# Patient Record
Sex: Female | Born: 1940
Health system: Southern US, Community
[De-identification: ages and names within clinical notes are randomized; demographics above are authoritative.]

## PROBLEM LIST (undated history)

## (undated) DIAGNOSIS — I420 Dilated cardiomyopathy: Secondary | ICD-10-CM

## (undated) DIAGNOSIS — Z800 Family history of malignant neoplasm of digestive organs: Secondary | ICD-10-CM

## (undated) DIAGNOSIS — K449 Diaphragmatic hernia without obstruction or gangrene: Secondary | ICD-10-CM

## (undated) DIAGNOSIS — Z8049 Family history of malignant neoplasm of other genital organs: Secondary | ICD-10-CM

## (undated) DIAGNOSIS — Z9049 Acquired absence of other specified parts of digestive tract: Secondary | ICD-10-CM

## (undated) DIAGNOSIS — R112 Nausea with vomiting, unspecified: Secondary | ICD-10-CM

## (undated) DIAGNOSIS — I10 Essential (primary) hypertension: Secondary | ICD-10-CM

## (undated) DIAGNOSIS — E785 Hyperlipidemia, unspecified: Secondary | ICD-10-CM

## (undated) DIAGNOSIS — T8859XA Other complications of anesthesia, initial encounter: Secondary | ICD-10-CM

## (undated) DIAGNOSIS — G4730 Sleep apnea, unspecified: Secondary | ICD-10-CM

## (undated) DIAGNOSIS — K5790 Diverticulosis of intestine, part unspecified, without perforation or abscess without bleeding: Secondary | ICD-10-CM

## (undated) DIAGNOSIS — I4819 Other persistent atrial fibrillation: Secondary | ICD-10-CM

## (undated) DIAGNOSIS — R7303 Prediabetes: Secondary | ICD-10-CM

## (undated) DIAGNOSIS — R011 Cardiac murmur, unspecified: Secondary | ICD-10-CM

## (undated) DIAGNOSIS — F419 Anxiety disorder, unspecified: Secondary | ICD-10-CM

## (undated) DIAGNOSIS — M1990 Unspecified osteoarthritis, unspecified site: Secondary | ICD-10-CM

## (undated) DIAGNOSIS — I2510 Atherosclerotic heart disease of native coronary artery without angina pectoris: Secondary | ICD-10-CM

## (undated) DIAGNOSIS — Z9889 Other specified postprocedural states: Secondary | ICD-10-CM

## (undated) DIAGNOSIS — R0600 Dyspnea, unspecified: Secondary | ICD-10-CM

## (undated) DIAGNOSIS — E049 Nontoxic goiter, unspecified: Secondary | ICD-10-CM

## (undated) HISTORY — DX: Diaphragmatic hernia without obstruction or gangrene: K44.9

## (undated) HISTORY — DX: Dilated cardiomyopathy: I42.0

## (undated) HISTORY — DX: Family history of malignant neoplasm of other genital organs: Z80.49

## (undated) HISTORY — DX: Essential (primary) hypertension: I10

## (undated) HISTORY — DX: Family history of malignant neoplasm of digestive organs: Z80.0

## (undated) HISTORY — DX: Other persistent atrial fibrillation: I48.19

## (undated) HISTORY — DX: Hyperlipidemia, unspecified: E78.5

## (undated) HISTORY — DX: Diverticulosis of intestine, part unspecified, without perforation or abscess without bleeding: K57.90

## (undated) HISTORY — PX: OTHER SURGICAL HISTORY: SHX169

## (undated) HISTORY — PX: CHOLECYSTECTOMY: SHX55

## (undated) HISTORY — DX: Nontoxic goiter, unspecified: E04.9

## (undated) HISTORY — DX: Atherosclerotic heart disease of native coronary artery without angina pectoris: I25.10

---

## 1998-07-10 ENCOUNTER — Ambulatory Visit (HOSPITAL_COMMUNITY): Admission: RE | Admit: 1998-07-10 | Discharge: 1998-07-10 | Payer: Self-pay | Admitting: *Deleted

## 1998-11-09 ENCOUNTER — Ambulatory Visit (HOSPITAL_COMMUNITY): Admission: RE | Admit: 1998-11-09 | Discharge: 1998-11-09 | Payer: Self-pay | Admitting: Gastroenterology

## 2000-01-22 ENCOUNTER — Encounter: Payer: Self-pay | Admitting: Family Medicine

## 2000-01-22 ENCOUNTER — Ambulatory Visit (HOSPITAL_COMMUNITY): Admission: RE | Admit: 2000-01-22 | Discharge: 2000-01-22 | Payer: Self-pay | Admitting: Family Medicine

## 2000-05-08 ENCOUNTER — Encounter: Payer: Self-pay | Admitting: Family Medicine

## 2000-05-08 ENCOUNTER — Ambulatory Visit (HOSPITAL_COMMUNITY): Admission: RE | Admit: 2000-05-08 | Discharge: 2000-05-08 | Payer: Self-pay | Admitting: Family Medicine

## 2000-12-22 ENCOUNTER — Encounter: Admission: RE | Admit: 2000-12-22 | Discharge: 2000-12-22 | Payer: Self-pay | Admitting: Obstetrics and Gynecology

## 2000-12-22 ENCOUNTER — Encounter: Payer: Self-pay | Admitting: Obstetrics and Gynecology

## 2000-12-30 ENCOUNTER — Encounter: Admission: RE | Admit: 2000-12-30 | Discharge: 2000-12-30 | Payer: Self-pay | Admitting: Family Medicine

## 2000-12-30 ENCOUNTER — Encounter: Payer: Self-pay | Admitting: Family Medicine

## 2001-12-10 ENCOUNTER — Other Ambulatory Visit: Admission: RE | Admit: 2001-12-10 | Discharge: 2001-12-10 | Payer: Self-pay | Admitting: Obstetrics and Gynecology

## 2002-02-24 ENCOUNTER — Encounter: Payer: Self-pay | Admitting: Family Medicine

## 2002-02-24 ENCOUNTER — Ambulatory Visit (HOSPITAL_COMMUNITY): Admission: RE | Admit: 2002-02-24 | Discharge: 2002-02-24 | Payer: Self-pay | Admitting: Family Medicine

## 2003-01-16 ENCOUNTER — Other Ambulatory Visit: Admission: RE | Admit: 2003-01-16 | Discharge: 2003-01-16 | Payer: Self-pay | Admitting: Obstetrics and Gynecology

## 2003-10-09 ENCOUNTER — Ambulatory Visit (HOSPITAL_COMMUNITY): Admission: RE | Admit: 2003-10-09 | Discharge: 2003-10-09 | Payer: Self-pay | Admitting: Family Medicine

## 2003-12-25 ENCOUNTER — Ambulatory Visit (HOSPITAL_COMMUNITY): Admission: RE | Admit: 2003-12-25 | Discharge: 2003-12-25 | Payer: Self-pay | Admitting: Gastroenterology

## 2004-04-15 ENCOUNTER — Other Ambulatory Visit: Admission: RE | Admit: 2004-04-15 | Discharge: 2004-04-15 | Payer: Self-pay | Admitting: Obstetrics and Gynecology

## 2004-05-02 ENCOUNTER — Encounter: Admission: RE | Admit: 2004-05-02 | Discharge: 2004-05-02 | Payer: Self-pay | Admitting: Obstetrics and Gynecology

## 2007-05-13 ENCOUNTER — Ambulatory Visit (HOSPITAL_COMMUNITY): Admission: RE | Admit: 2007-05-13 | Discharge: 2007-05-13 | Payer: Self-pay | Admitting: Specialist

## 2008-01-03 ENCOUNTER — Other Ambulatory Visit: Admission: RE | Admit: 2008-01-03 | Discharge: 2008-01-03 | Payer: Self-pay | Admitting: Family Medicine

## 2008-05-11 ENCOUNTER — Encounter: Admission: RE | Admit: 2008-05-11 | Discharge: 2008-05-11 | Payer: Self-pay | Admitting: Family Medicine

## 2009-01-15 ENCOUNTER — Ambulatory Visit: Payer: Self-pay | Admitting: Gastroenterology

## 2009-02-07 ENCOUNTER — Telehealth: Payer: Self-pay | Admitting: Gastroenterology

## 2009-02-08 ENCOUNTER — Ambulatory Visit: Payer: Self-pay | Admitting: Gastroenterology

## 2010-02-16 ENCOUNTER — Observation Stay (HOSPITAL_COMMUNITY): Admission: EM | Admit: 2010-02-16 | Discharge: 2010-02-16 | Payer: Self-pay | Admitting: Internal Medicine

## 2010-02-16 ENCOUNTER — Encounter: Payer: Self-pay | Admitting: Emergency Medicine

## 2010-02-16 ENCOUNTER — Ambulatory Visit: Payer: Self-pay | Admitting: Diagnostic Radiology

## 2010-12-22 ENCOUNTER — Encounter: Payer: Self-pay | Admitting: Specialist

## 2010-12-31 NOTE — Miscellaneous (Signed)
Summary: [facility] Previsit/prep  Clinical Lists Changes  Medications: Added new medication of DULCOLAX 5 MG  TBEC (BISACODYL) Day before procedure take 2 at 3pm and 2 at 8pm. - Signed Added new medication of METOCLOPRAMIDE HCL 10 MG  TABS (METOCLOPRAMIDE HCL) As per prep instructions. - Signed Added new medication of MIRALAX   POWD (POLYETHYLENE GLYCOL 3350) As per prep  instructions. - Signed Rx of DULCOLAX 5 MG  TBEC (BISACODYL) Day before procedure take 2 at 3pm and 2 at 8pm.;  #4 x 0;  Signed;  Entered by: Wyona Almas RN;  Authorized by: Louis Meckel MD;  Method used: Electronically to [facility] [phone] [place], [place], [place]  [address], Ph: [phone], Fax: [phone] Rx of METOCLOPRAMIDE HCL 10 MG  TABS (METOCLOPRAMIDE HCL) As per prep instructions.;  #2 x 0;  Signed;  Entered by: Wyona Almas RN;  Authorized by: Louis Meckel MD;  Method used: Electronically to [facility] [phone] [place], [place], [place]  [address], Ph: [phone], Fax: [phone] Rx of MIRALAX   POWD (POLYETHYLENE GLYCOL 3350) As per prep  instructions.;  #255gm x 0;  Signed;  Entered by: Wyona Almas RN;  Authorized by: Louis Meckel MD;  Method used: Electronically to [facility] [phone] [place], [place], [place]  [address], Ph: [phone], Fax: [phone] Allergies: Added new allergy or adverse reaction of CODEINE Observations: Added new observation of NKA: F (01/15/2009 16:19)    Prescriptions: MIRALAX   POWD (POLYETHYLENE GLYCOL 3350) As per prep  instructions.  #255gm x 0   Entered by:   Wyona Almas RN   Authorized by:   Louis Meckel MD   Signed by:   Wyona Almas RN on 01/15/2009   Method used:   Electronically to        [facility] [address] (retail)       [phone]       [place], [place]  [address]       Ph: [phone]   Fax: [phone]   RxID:   [id number] METOCLOPRAMIDE HCL 10 MG  TABS (METOCLOPRAMIDE HCL) As per prep instructions.  #2 x 0   Entered by:   Wyona Almas RN   Authorized by:   Louis Meckel MD   Signed by:   Wyona Almas RN on 01/15/2009   Method used:   Electronically to        [facility] [address] (retail)       [phone]       [place], [place]  [address]       Ph: [phone]       Fax: [phone]   RxID:   [id number] DULCOLAX 5 MG  TBEC (BISACODYL) Day before procedure take 2 at 3pm and 2 at 8pm.  #4 x 0   Entered by:   Wyona Almas RN   Authorized by:   Louis Meckel MD   Signed by:   Wyona Almas RN on 01/15/2009   Method used:   Electronically to        [facility] [address] (retail)       [phone]       [place], [place]  [address]       Ph: [phone]  Fax: [phone]   RxID:   [id number]

## 2010-12-31 NOTE — Progress Notes (Signed)
Summary: TRIAGE  Phone Note Call from Patient Call back at Home Phone [phone]   Caller: Patient Call For: KAPLAN  Reason for Call: Talk to Nurse Details for Reason: TRIAGE  Summary of Call: pt took her Dulcolax @ 3pm and just vomited alot . sch Colon tomorrow @ 10:30 Initial call taken by: Guadlupe Spanish [facility],  February 07, 2009 3:34 PM  Follow-up for Phone Call        Pt stated that she vomitted about 30 minutes after taking Dulcolax.  Asked if it would be okay to take some Phenergan that she already had.  Instructed her to go ahead and take the Reglan that she was to take at 4:30 as part of her prep and again 2 hours later.  Pt. verbalized understanding. Advised to call back with any problems. Follow-up by: Jennye Boroughs RN,  February 07, 2009 3:47 PM

## 2011-02-18 ENCOUNTER — Inpatient Hospital Stay (HOSPITAL_BASED_OUTPATIENT_CLINIC_OR_DEPARTMENT_OTHER)
Admission: RE | Admit: 2011-02-18 | Discharge: 2011-02-18 | Disposition: A | Discharge status: Home / Self Care | Payer: PRIVATE HEALTH INSURANCE | Source: Ambulatory Visit | Attending: Cardiology | Admitting: Cardiology

## 2011-02-18 DIAGNOSIS — R9439 Abnormal result of other cardiovascular function study: Secondary | ICD-10-CM | POA: Insufficient documentation

## 2011-02-18 DIAGNOSIS — I2510 Atherosclerotic heart disease of native coronary artery without angina pectoris: Secondary | ICD-10-CM | POA: Insufficient documentation

## 2011-02-24 LAB — COMPREHENSIVE METABOLIC PANEL
ALT: 15 U/L (ref 0–35)
ALT: 17 U/L (ref 0–35)
AST: 18 U/L (ref 0–37)
AST: 29 U/L (ref 0–37)
Albumin: 2.8 g/dL — ABNORMAL LOW (ref 3.5–5.2)
Albumin: 3.9 g/dL (ref 3.5–5.2)
Alkaline Phosphatase: 111 U/L (ref 39–117)
Alkaline Phosphatase: 83 U/L (ref 39–117)
BUN: 17 mg/dL (ref 6–23)
BUN: 28 mg/dL — ABNORMAL HIGH (ref 6–23)
CO2: 25 mEq/L (ref 19–32)
CO2: 31 mEq/L (ref 19–32)
Calcium: 7.4 mg/dL — ABNORMAL LOW (ref 8.4–10.5)
Calcium: 8.5 mg/dL (ref 8.4–10.5)
Chloride: 101 mEq/L (ref 96–112)
Chloride: 99 mEq/L (ref 96–112)
Creatinine, Ser: 1 mg/dL (ref 0.4–1.2)
Creatinine, Ser: 1.1 mg/dL (ref 0.4–1.2)
GFR calc Af Amer: 60 mL/min — ABNORMAL LOW (ref >60)
GFR calc Af Amer: >60 mL/min (ref >60)
GFR calc non Af Amer: 49 mL/min — ABNORMAL LOW (ref >60)
GFR calc non Af Amer: 55 mL/min — ABNORMAL LOW (ref >60)
Glucose, Bld: 100 mg/dL — ABNORMAL HIGH (ref 70–99)
Glucose, Bld: 102 mg/dL — ABNORMAL HIGH (ref 70–99)
Potassium: 3.5 mEq/L (ref 3.5–5.1)
Potassium: 4 mEq/L (ref 3.5–5.1)
Sodium: 133 mEq/L — ABNORMAL LOW (ref 135–145)
Sodium: 142 mEq/L (ref 135–145)
Total Bilirubin: 0.6 mg/dL (ref 0.3–1.2)
Total Bilirubin: 0.7 mg/dL (ref 0.3–1.2)
Total Protein: 5.7 g/dL — ABNORMAL LOW (ref 6.0–8.3)
Total Protein: 7.5 g/dL (ref 6.0–8.3)

## 2011-02-24 LAB — URINALYSIS, ROUTINE W REFLEX MICROSCOPIC
Bilirubin Urine: NEGATIVE
Glucose, UA: NEGATIVE mg/dL
Hgb urine dipstick: NEGATIVE
Ketones, ur: 15 mg/dL — AB
Nitrite: NEGATIVE
Protein, ur: NEGATIVE mg/dL
Specific Gravity, Urine: 1.045 — ABNORMAL HIGH (ref 1.005–1.030)
Urobilinogen, UA: 0.2 mg/dL (ref 0.0–1.0)
pH: 6 (ref 5.0–8.0)

## 2011-02-24 LAB — DIFFERENTIAL
Basophils Absolute: 0 10*3/uL (ref 0.0–0.1)
Basophils Absolute: 0.1 10*3/uL (ref 0.0–0.1)
Basophils Relative: 0 % (ref 0–1)
Basophils Relative: 1 % (ref 0–1)
Eosinophils Absolute: 0.3 10*3/uL (ref 0.0–0.7)
Eosinophils Absolute: 0.3 10*3/uL (ref 0.0–0.7)
Eosinophils Relative: 2 % (ref 0–5)
Eosinophils Relative: 3 % (ref 0–5)
Lymphocytes Relative: 21 % (ref 12–46)
Lymphocytes Relative: 23 % (ref 12–46)
Lymphs Abs: 2.1 10*3/uL (ref 0.7–4.0)
Lymphs Abs: 3 10*3/uL (ref 0.7–4.0)
Monocytes Absolute: 0.9 10*3/uL (ref 0.1–1.0)
Monocytes Absolute: 0.9 10*3/uL (ref 0.1–1.0)
Monocytes Relative: 7 % (ref 3–12)
Monocytes Relative: 9 % (ref 3–12)
Neutro Abs: 5.9 10*3/uL (ref 1.7–7.7)
Neutro Abs: 9.9 10*3/uL — ABNORMAL HIGH (ref 1.7–7.7)
Neutrophils Relative %: 64 % (ref 43–77)
Neutrophils Relative %: 70 % (ref 43–77)

## 2011-02-24 LAB — URINE MICROSCOPIC-ADD ON

## 2011-02-24 LAB — URINE CULTURE: Colony Count: 60000

## 2011-02-24 LAB — CBC
HCT: 29.9 % — ABNORMAL LOW (ref 36.0–46.0)
HCT: 35.7 % — ABNORMAL LOW (ref 36.0–46.0)
Hemoglobin: 10.5 g/dL — ABNORMAL LOW (ref 12.0–15.0)
Hemoglobin: 12.4 g/dL (ref 12.0–15.0)
MCHC: 34.8 g/dL (ref 30.0–36.0)
MCHC: 35.2 g/dL (ref 30.0–36.0)
MCV: 91.7 fL (ref 78.0–100.0)
MCV: 93.3 fL (ref 78.0–100.0)
Platelets: 227 10*3/uL (ref 150–400)
Platelets: 293 10*3/uL (ref 150–400)
RBC: 3.2 MIL/uL — ABNORMAL LOW (ref 3.87–5.11)
RBC: 3.89 MIL/uL (ref 3.87–5.11)
RDW: 13.1 % (ref 11.5–15.5)
RDW: 14.1 % (ref 11.5–15.5)
WBC: 14.2 10*3/uL — ABNORMAL HIGH (ref 4.0–10.5)
WBC: 9.2 10*3/uL (ref 4.0–10.5)

## 2011-02-24 LAB — CULTURE, BLOOD (ROUTINE X 2)
Culture: NO GROWTH
Culture: NO GROWTH

## 2011-02-24 LAB — PROTIME-INR
INR: 1.09 (ref 0.00–1.49)
Prothrombin Time: 14 seconds (ref 11.6–15.2)

## 2011-02-24 LAB — LIPASE, BLOOD: Lipase: 48 U/L (ref 23–300)

## 2011-02-24 LAB — TSH: TSH: 0.685 u[IU]/mL (ref 0.350–4.500)

## 2011-02-24 NOTE — Procedures (Signed)
[name], Tabitha Murphy NO.:  [id number]  MEDICAL RECORD NO.:  [id number]           PATIENT TYPE:  LOCATION:                                 FACILITY:  PHYSICIAN:  Jake Bathe, MD           DATE OF BIRTH:  DATE OF PROCEDURE:  02/18/2011 DATE OF DISCHARGE:                           CARDIAC CATHETERIZATION   INDICATIONS:  A 70 year old female with mildly abnormal stress test with symptoms of exertional angina when walking up hill.  PROCEDURE DETAILS:  Informed consent was obtained.  Risk of stroke, heart attack, death, renal impairment, arterial damage, bleeding were explained to the patient at length.  The groin was visualized with fluoroscopy.  A 1% lidocaine was used for local anesthesia.  Zofran was given prior to procedure due to the patient's prior intolerances with anesthesia, 2 mg of Versed and 50 mcg of fentanyl were used for conscious sedation.  A 4-French sheath was inserted in the right femoral artery without difficulty using the modified Seldinger technique and no torque Williams right catheter and Judkins left #4 catheter were used to selectively cannulate the coronary arteries and multiple views with hand injection of Omnipaque were obtained.  An angled pigtail was used to cross the left ventricle, and a left ventriculogram was performed in the RAO position using power injection of 30 mL of contrast.  Following procedure, sheath was removed.  Hemodynamics were stable, manual compression held.  FINDINGS: 1. Left main very short, almost dual ostia of the LAD and circumflex.     No angiographic significant disease. 2. LAD - the vessel divides into four very small-caliber diagonal     branches.  No angiographically significant disease is present. 3. Circumflex artery.  The larger caliber vessel than the LAD giving     rise to two major obtuse marginal branches.  No angiographically     significant disease in the obtuse marginal branches.  In the  AV     groove circumflex, there is a 30% irregularity between the second     and third obtuse marginal branch, minor, non-flow limiting. 4. Right coronary artery gives rise to the posterior descending     artery.  No angiographically significant disease is present.  One     acute marginal branch.  Left ventriculogram demonstrates normal left ventricular ejection fraction of 65% with no wall motion abnormality.  No significant mitral regurgitation.  Descending aorta is mildly tortuous but no evidence of aortic aneurysm.  IMPRESSION: 1. A 30% mid atrioventricular groove circumflex stenosis but     otherwise, no angiographically significant heart disease present. 2. Normal left ventricular ejection fraction of 60% with no wall     motion abnormality. 3. No significant mitral regurgitation, no aortic stenosis present.     Hemodynamics demonstrate left ventricular systolic pressure of 124     with an end-diastolic pressure of 16 mmHg.  Aortic pressure 124/54     with a mean of 80 mmHg.  PLAN:  I will have her follow up in 1 week in clinic for postcath followup.  Overall, reassuring cardiac catheterization.  Continue with medical management.     Jake Bathe, MD     MCS/MEDQ  D:  02/18/2011  T:  02/19/2011  Job:  [id number]  cc:   Molly Maduro A. Nicholos Johns, M.D.  Electronically Signed by Donato Schultz MD on 02/24/2011 02:34:31 PM

## 2011-03-12 ENCOUNTER — Other Ambulatory Visit: Payer: Self-pay | Admitting: Family Medicine

## 2011-03-12 DIAGNOSIS — G8929 Other chronic pain: Secondary | ICD-10-CM

## 2011-03-12 DIAGNOSIS — M545 Low back pain, unspecified: Secondary | ICD-10-CM

## 2011-03-19 ENCOUNTER — Ambulatory Visit
Admission: RE | Admit: 2011-03-19 | Discharge: 2011-03-19 | Disposition: A | Discharge status: Home / Self Care | Payer: PRIVATE HEALTH INSURANCE | Source: Ambulatory Visit | Attending: Family Medicine | Admitting: Family Medicine

## 2011-03-19 DIAGNOSIS — M545 Low back pain, unspecified: Secondary | ICD-10-CM

## 2011-03-19 DIAGNOSIS — G8929 Other chronic pain: Secondary | ICD-10-CM

## 2011-04-18 NOTE — Op Note (Signed)
NAME:  Tabitha Murphy, Tabitha Murphy                          ACCOUNT NO.:  [id number]   MEDICAL RECORD NO.:  [id number]                   PATIENT TYPE:  AMB   LOCATION:  ENDO                                 FACILITY:  [facility]   PHYSICIAN:  Petra Kuba, M.D.                 DATE OF BIRTH:  1941/07/26   DATE OF PROCEDURE:  12/25/2003  DATE OF DISCHARGE:                                 OPERATIVE REPORT   PROCEDURE PERFORMED:  Colonoscopy.   ENDOSCOPIST:  Petra Kuba, M.D.   INDICATIONS FOR PROCEDURE:  Patient with history of colon polyps due for  repeat screening.  Consent was signed after the risks, benefits, methods and  options were thoroughly discussed in the past.   MEDICINES USED:  Demerol 50 mg, Versed 6 mg.   DESCRIPTION OF PROCEDURE:  Rectal inspection was pertinent for external  hemorrhoids, small.  Digital exam was negative.  A video pediatric  adjustable colonoscope was inserted and with some difficulty due to a  tortuous looping colon, we were able to advance to the ileocecal valve and  half cecum and the appendiceal orifice in the distance was able to be seen.  Unfortunately, despite multiple abdominal pressures and changing her to her  back and her right side, we were unable to see a small area right behind the  valve although nothing obvious was growing from around it.  After multiple  attempts to advance into the cecum but with increased looping and pain, we  elected to withdraw.  The prep was adequate.  There was some liquid stool  that required washing and suctioning.  On slow withdrawal through the colon,  other than a rare early left-sided diverticulum, no polyps, tumors, masses  were seen.  Anorectal pullthrough and retroflexion confirmed some small  hemorrhoids in the rectum.  There was a bit of scope trauma in the distal  sigmoid seen.  No other abnormalities.  The scope was reinserted a short  ways up the left side of the colon, air was suctioned, scope removed.  The  patient tolerated the procedure adequately.  There was no immediate obvious  significant complication.   ENDOSCOPIC DIAGNOSIS:  1. Internal and external hemorrhoids.  2. Distal sigmoid scope trauma, mild.  3. Early left-sided diverticula.  4. Otherwise within normal limits with half the cecum being seen.   PLAN:  Yearly rectals and guaiacs per either Dr. Cherly Hensen or Dr. Azucena Kuba.  Happy  to see back p.r.n.  Repeat screening in five years.  Consider either a  virtual colonoscopy or retry with possibly the regular scope.                                               Petra Kuba, M.D.    MEM/MEDQ  D:  12/25/2003  T:  12/25/2003  Job:  [id number]   cc:   Maxie Better, M.D.  [address]  [place]  [place] [address]  Fax: [phone]   Elana Alm. Nicholos Johns, M.D.  [address]  [place]  [place] [address]  Fax: [phone]

## 2011-04-23 IMAGING — CR DG CHEST 2V
2 series · 2 of 2 positions shown · non-contrast
Comparison: None.

CLINICAL DATA: Cough and chills.  Rule out infection.

CHEST - 2 VIEW

[w chest pa]
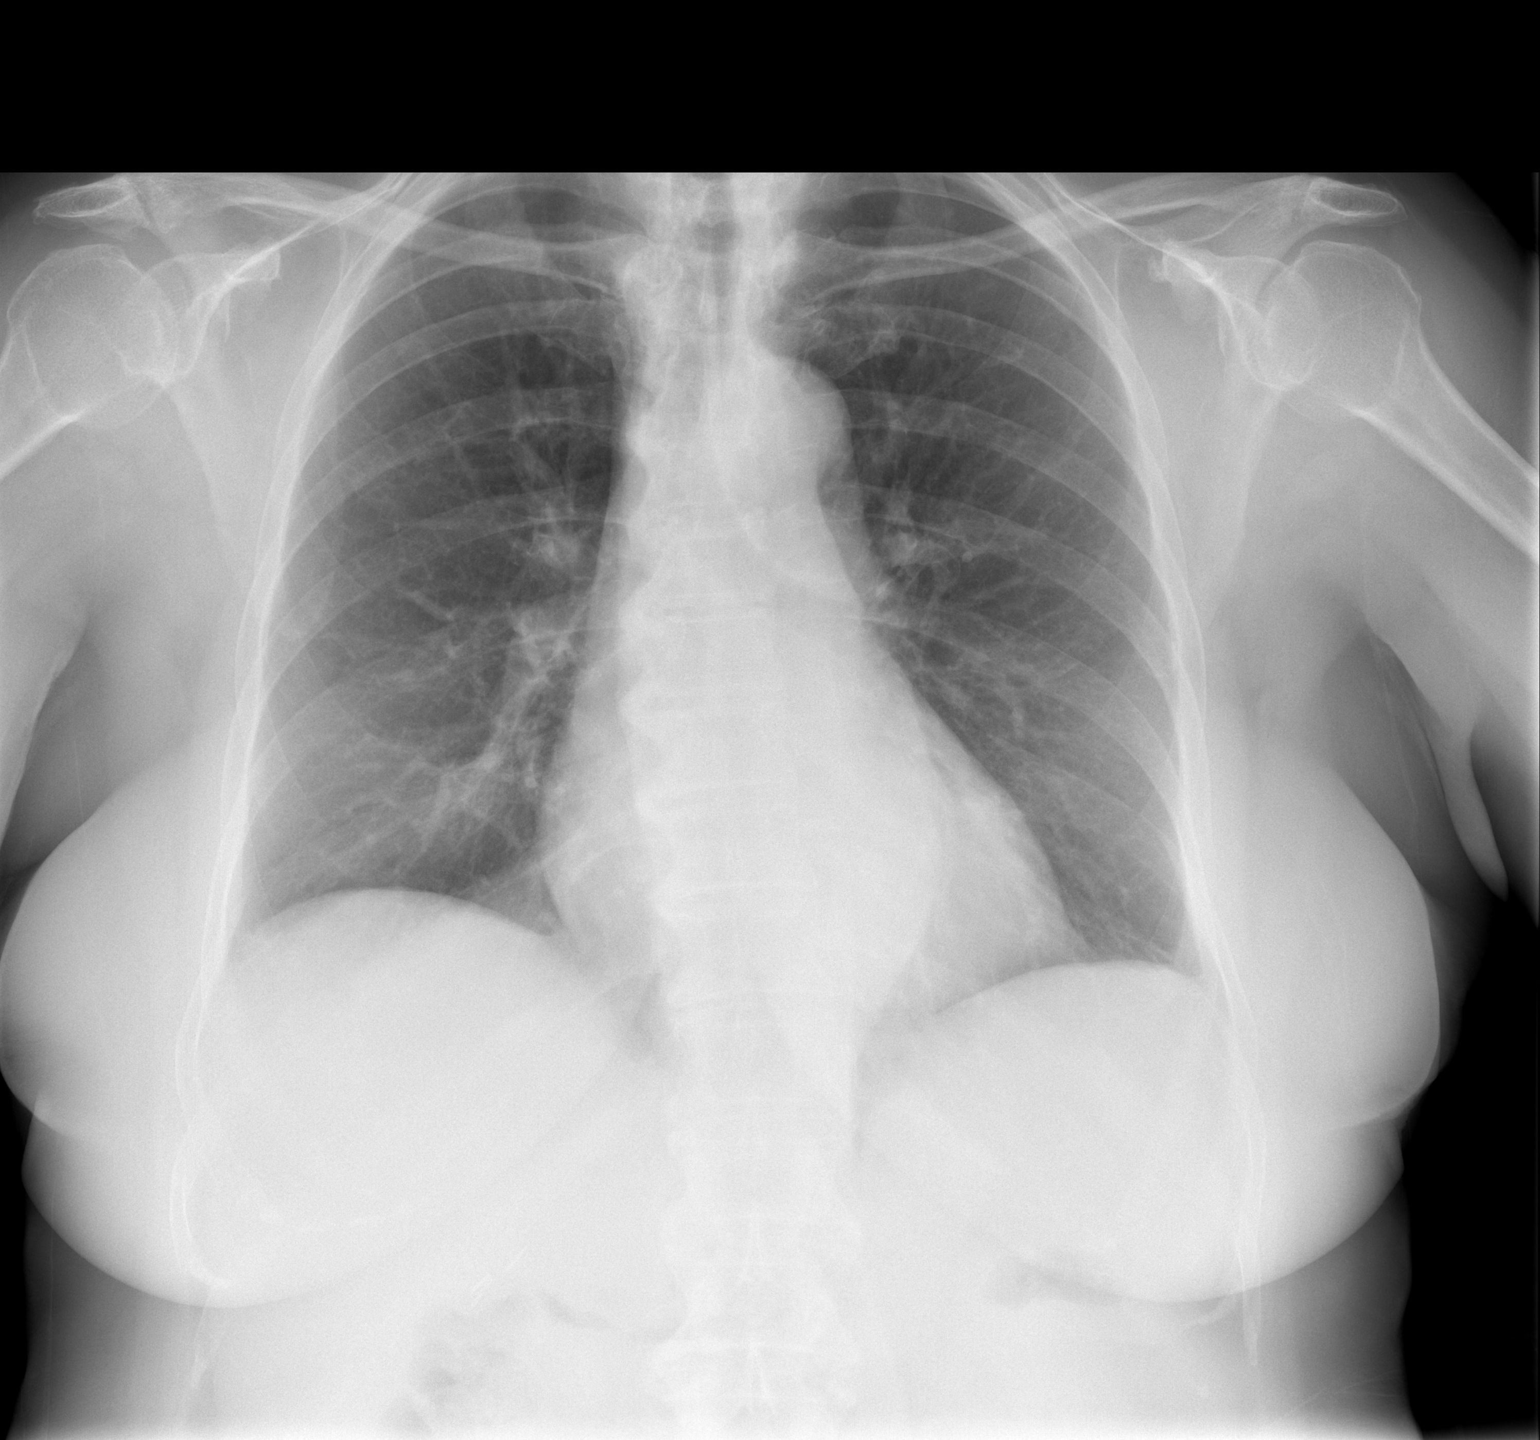

[w chest lat]
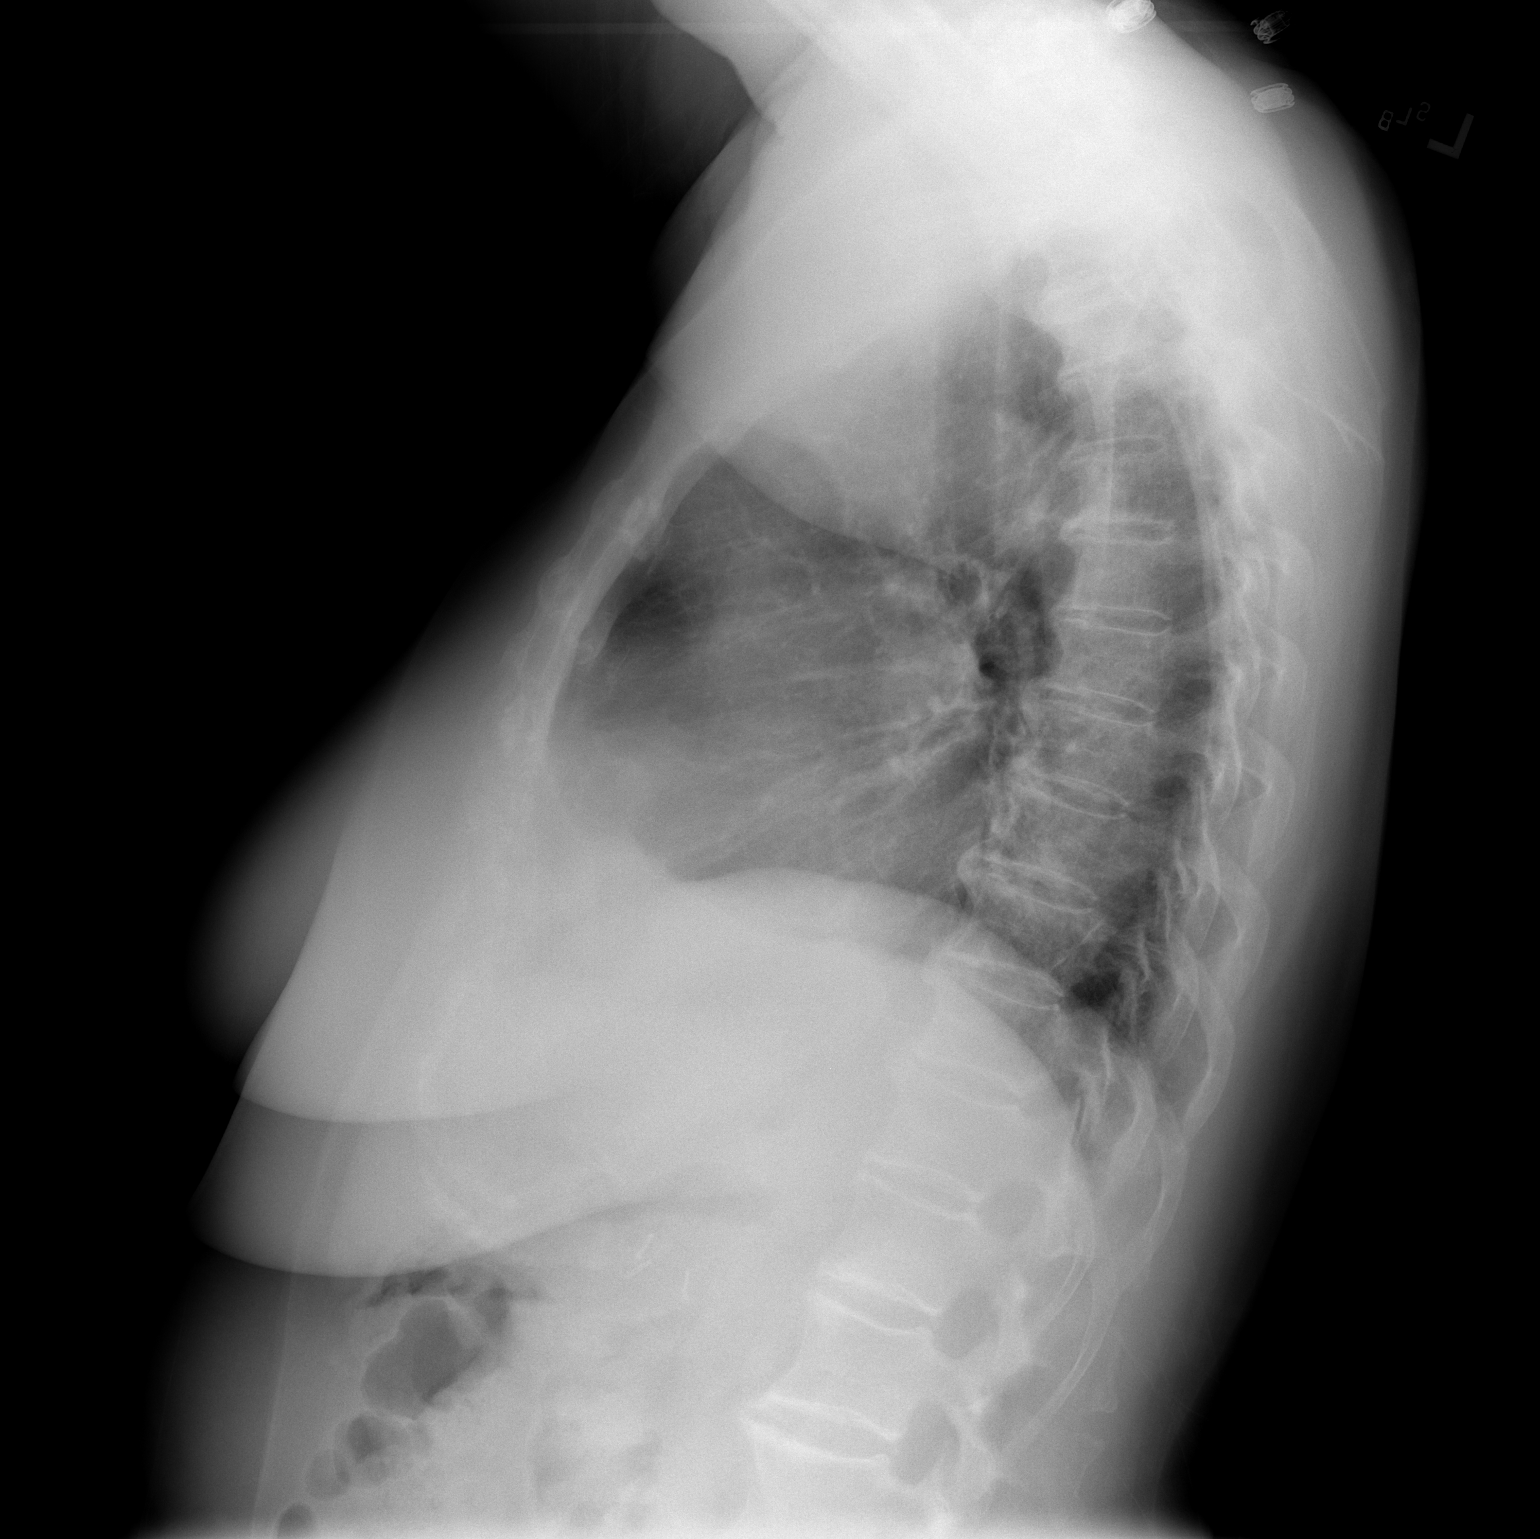

[2 of 2 positions shown; findings below may reference images not displayed]

FINDINGS: Heart size is normal.  The aorta is uncoiled and
tortuous.  There is no heart failure.  The lungs are clear without
infiltrate or effusion.  Thoracic disc degeneration and spurring is
present.
IMPRESSION: No acute cardiopulmonary disease.

## 2011-05-19 ENCOUNTER — Telehealth: Payer: Self-pay | Admitting: Gastroenterology

## 2011-05-19 NOTE — Telephone Encounter (Signed)
I agree it is not urgent. OK to see an extender.

## 2011-05-19 NOTE — Telephone Encounter (Signed)
Spoke with patient. She states she has had right side abdominal pain for several months. States she saw her PCP Dr. Azucena Kuba and he told her it was not her appendix. He told her to see her GI doctor. Pain is across her abdomen even with the belly button. Denies constipation, diarrhea, nausea, vomiting or fever. Offered for patient to see an extender on Friday or see Dr Arlyce Dice next week. Patient could not come on Friday or when Dr. Arlyce Dice was available. Scheduled on 05/27/11 at 2:30 Pm with Tabitha Cluster, NP. Patient understands to seek treatment earlier if gets worse or develops a fever.

## 2011-05-27 ENCOUNTER — Encounter: Payer: Self-pay | Admitting: Nurse Practitioner

## 2011-05-27 ENCOUNTER — Ambulatory Visit (INDEPENDENT_AMBULATORY_CARE_PROVIDER_SITE_OTHER): Payer: PRIVATE HEALTH INSURANCE | Admitting: Nurse Practitioner

## 2011-05-27 VITALS — BP 118/80 | HR 66 | Ht 62.0 in | Wt 176.0 lb

## 2011-05-27 DIAGNOSIS — R1031 Right lower quadrant pain: Secondary | ICD-10-CM

## 2011-05-27 NOTE — Progress Notes (Signed)
Tabitha Murphy [id number] 02/20/1941   HISTORY OR PRESENT ILLNESS : Tabitha Murphy is followed by Dr. Arlyce Dice for screening colonoscopies, last one March 2010.  Prior to that patient was followed by Dr. Ewing Schlein with [facility]. Patient is referred by her PCP for evaluation of RLQ pain. The pain has been present on an intermittent basis for at least eight months.  Pain  has no relation to food, defecation or physical activity. The pain often wakes her up at night.. Her bowel movements are normal. No rectal bleeding. No nausea or vomiting. No abnormal weight loss. No fever.  No urinary problems. No unusual vaginal discharge. Patient is up-to-date on her GYN examinations. She also has a history of acid reflux. She takes a proton pump inhibitor as needed which is usually 2-3 times a week   In echart there is a CTscan of abd/ pelvis with contrast from March 2011 done for several month history of  RLQ pain. Patient is adamant that she went to hospital for fever, never mentioned RLQ pain nor did she have this pain then. The CTscan was normal.    Current Medications, Allergies, Past Medical History, Past Surgical History, Family History and Social History were reviewed in [employer] record.   PHYSICAL EXAMINATION : General: Well developed white female in no acute distress Head: Normocephalic and atraumatic Eyes:  sclerae anicteric,conjunctive pink. Ears: Normal auditory acuity Mouth: No deformity or lesions Neck: Supple, no masses.  Lungs: Clear throughout to auscultation Heart: Regular rate and rhythm; no murmurs heard Abdomen: Soft, nondistended. Mild RLQ tenderness right lumbar region. No masses or hepatomegaly noted. Normal bowel sounds Rectal: Not done Musculoskeletal: Symmetrical with no gross deformities  Skin: No lesions on visible extremities Extremities: No edema or deformities noted Neurological: Alert oriented x 4, grossly nonfocal Cervical Nodes:  No significant  cervical adenopathy Psychological:  Alert and cooperative. Normal mood and affect  ASSESSMENT AND PLAN :

## 2011-05-27 NOTE — Assessment & Plan Note (Addendum)
Right lower quadrant abdominal pain (right lumbar region) unrelated to meals, defecation or physical activity.She has no weight loss, bowel change or rectal bleeding. Patient is up-to-date on colon cancer screening. Patient has chronic lower back problems, I wonder if her RLQ pain is actually originating from her lower back  problems? She is scheduled for what sounds like an epidural tomorrow and I would like to see if it also helps her "abdominal pain" as well. I asked her to call me next week with condition update. At this point the pain does not seem to be gastrointestinal in origin but if pain persists we can certainly evaluate further.

## 2011-05-27 NOTE — Patient Instructions (Signed)
Please call us the later part of next week with a progress on her pain level.  Call [phone] and ask for [facility] or Bonita Quin.

## 2011-05-29 NOTE — Progress Notes (Signed)
Agree with initial assessment and plan 

## 2011-06-06 ENCOUNTER — Telehealth: Payer: Self-pay | Admitting: Gastroenterology

## 2011-06-06 NOTE — Telephone Encounter (Signed)
Pt called because Gunnar Fusi asked her to call and update her condition per 05/27/11 OV. Pt has r sided pain and was due for an Epidural for back pain this past Tuesday . Gunnar Fusi wanted pt to call if the Epidural helped the pain. Pt reports the pain is still there, it may be a little worse. She has made an appt to see her GYN for 06/12/11. Gunnar Fusi, do want to see if the GYN finds anything or do you want to see her before then? Please advise.

## 2011-06-10 NOTE — Telephone Encounter (Signed)
If no GYN reason found for pain then we can revisit possibility of this being GI related though doesn't sound like it is. Please get her in with her primary GI doctor for a follow up if GYN exam negative. Thanks

## 2011-06-10 NOTE — Telephone Encounter (Signed)
No answer on number.

## 2011-06-11 NOTE — Telephone Encounter (Signed)
Spoke with pt to inform her to let us know what if anything her GYN finds on upcoming visit. Even though it doesn't sound like a GI problem, we can do further work up. Pt will call with findings after GYN appt.

## 2011-06-19 ENCOUNTER — Telehealth: Payer: Self-pay | Admitting: Nurse Practitioner

## 2011-06-19 NOTE — Telephone Encounter (Signed)
Left a message for patient to call me back.

## 2011-06-19 NOTE — Telephone Encounter (Signed)
Patient calling to report she still has right sided pain. She states she had an epidural and a pelvic ultrasound which was negative per patient. She states Willette Cluster, NP told her she would need a CT scan if the pain did not go away. Please, advise.

## 2011-06-23 ENCOUNTER — Ambulatory Visit: Payer: PRIVATE HEALTH INSURANCE | Admitting: Gastroenterology

## 2011-06-23 NOTE — Telephone Encounter (Signed)
Rene Kocher, Did she follow up with PCP after GYN?  If so, please make sure she has no urinary symptoms and bowels are regular. If so then we can do a CTscan of abdomen and pelvis with contrast (make sure she has had BUN and Creat.done in last couple of months and they were normal). Thanks

## 2011-06-23 NOTE — Telephone Encounter (Signed)
Patient did not see PCP after GYN. States she is due to see her PCP at the end of August to discuss lab work. She denies any urinary symptoms and states her bowels are regular. Do you want her to see PCP prior to CT scan? Please, advise

## 2011-06-23 NOTE — Telephone Encounter (Signed)
One of your previous notes mentioned worsening rlq pain. If this is the case then proceed with scan. Otherwise I would like PCP input before subjecting her to another scan as pain doesn't seem GI related. Thanks

## 2011-06-24 NOTE — Telephone Encounter (Signed)
Patient reports that she does still have the pain, and it may be a little better.  She is asked to follow up with her primary care MD about the pain

## 2011-06-24 NOTE — Telephone Encounter (Signed)
Left message for patient to call back  

## 2011-07-23 ENCOUNTER — Telehealth: Payer: Self-pay | Admitting: Nurse Practitioner

## 2011-07-23 NOTE — Telephone Encounter (Signed)
Spoke with patient. She is still having pain. The pain comes and goes but a soreness stays there all the time. Patient states she would like to have a CT.

## 2011-07-24 NOTE — Telephone Encounter (Signed)
Spoke with Willette Cluster, NP.Schedule patient for Cy abd, pelvis. Scheduled at [facility] CT on 07/28/11 at 10:00 AM.. Contrast 2 and 1 hour prior, NPO 4 hours prior. Labs to be drawn on 07/25/11. Patient notified and instructions and contrast up front for pick up.

## 2011-07-25 ENCOUNTER — Other Ambulatory Visit (INDEPENDENT_AMBULATORY_CARE_PROVIDER_SITE_OTHER): Payer: PRIVATE HEALTH INSURANCE

## 2011-07-25 DIAGNOSIS — R109 Unspecified abdominal pain: Secondary | ICD-10-CM

## 2011-07-25 LAB — BUN: BUN: 14 mg/dL (ref 6–23)

## 2011-07-25 LAB — CREATININE, SERUM: Creatinine, Ser: 0.9 mg/dL (ref 0.4–1.2)

## 2011-07-28 ENCOUNTER — Ambulatory Visit (INDEPENDENT_AMBULATORY_CARE_PROVIDER_SITE_OTHER)
Admission: RE | Admit: 2011-07-28 | Discharge: 2011-07-28 | Disposition: A | Discharge status: Home / Self Care | Payer: PRIVATE HEALTH INSURANCE | Source: Ambulatory Visit | Attending: Nurse Practitioner | Admitting: Nurse Practitioner

## 2011-07-28 DIAGNOSIS — Z9049 Acquired absence of other specified parts of digestive tract: Secondary | ICD-10-CM

## 2011-07-28 DIAGNOSIS — R109 Unspecified abdominal pain: Secondary | ICD-10-CM

## 2011-07-28 HISTORY — DX: Acquired absence of other specified parts of digestive tract: Z90.49

## 2011-07-28 MED ORDER — IOHEXOL 300 MG/ML  SOLN
100.0000 mL | Freq: Once | INTRAMUSCULAR | Status: AC | PRN
  Administered 2011-07-28: 100 mL via INTRAVENOUS

## 2011-07-30 ENCOUNTER — Telehealth: Payer: Self-pay | Admitting: *Deleted

## 2011-07-30 NOTE — Telephone Encounter (Signed)
Patient notified of results.

## 2011-07-30 NOTE — Telephone Encounter (Signed)
Message copied by Daphine Deutscher on Wed Jul 30, 2011 10:57 AM ------      Message from: Meredith Pel      Created: Tue Jul 29, 2011  7:00 PM       Rene Kocher, please let patient know that CTscan was normal. Thanks.

## 2011-07-30 NOTE — Telephone Encounter (Signed)
Left a message for patient to call me. 

## 2012-01-09 ENCOUNTER — Other Ambulatory Visit: Payer: Self-pay | Admitting: Anesthesiology

## 2012-01-09 DIAGNOSIS — M545 Low back pain, unspecified: Secondary | ICD-10-CM

## 2012-01-13 ENCOUNTER — Ambulatory Visit
Admission: RE | Admit: 2012-01-13 | Discharge: 2012-01-13 | Disposition: A | Discharge status: Home / Self Care | Payer: PRIVATE HEALTH INSURANCE | Source: Ambulatory Visit | Attending: Anesthesiology | Admitting: Anesthesiology

## 2012-01-13 DIAGNOSIS — M545 Low back pain, unspecified: Secondary | ICD-10-CM

## 2013-01-25 ENCOUNTER — Other Ambulatory Visit (HOSPITAL_COMMUNITY)
Admission: RE | Admit: 2013-01-25 | Discharge: 2013-01-25 | Disposition: A | Discharge status: Home / Self Care | Payer: PRIVATE HEALTH INSURANCE | Source: Ambulatory Visit | Attending: Family Medicine | Admitting: Family Medicine

## 2013-01-25 ENCOUNTER — Other Ambulatory Visit: Payer: Self-pay | Admitting: Family Medicine

## 2013-01-25 DIAGNOSIS — Z124 Encounter for screening for malignant neoplasm of cervix: Secondary | ICD-10-CM | POA: Insufficient documentation

## 2013-06-06 ENCOUNTER — Other Ambulatory Visit: Payer: Self-pay | Admitting: Radiology

## 2014-01-30 ENCOUNTER — Telehealth: Payer: Self-pay | Admitting: Cardiology

## 2014-01-30 ENCOUNTER — Telehealth: Payer: Self-pay | Admitting: *Deleted

## 2014-01-30 NOTE — Telephone Encounter (Signed)
I spoke with the patient. She states that she has been having left arm pain that has been persistent for the last 2 days. She has had intermittent chest and back pain. She was cathed in 2012 and this was essentially normal. Her pain is worse at night, but not worse with movement. She has been taking a full strength ASA about every 4 hours. She thinks she was given NTG years ago, but is not sure. I advised if this was given to her a long time ago, she should not use it. She denies diaphoresis/ nausea. I have advised that she report to the ER for evaluation. She refused to do this. I explained that if she came to the office, she would most likely be admitted anyway and that we are not equipped to treat her emergently if she needed it. Reviewed with Dr. Harrington Challenger to confirm this and she did recommend that the patient go to the ER for evaluation. I advised the patient I reviewed this with Dr. Harrington Challenger (DOD) and she reluctantly agreed to go to the ER. I have paged Trish to let her know that the patient should be coming to [facility] ER.

## 2014-01-30 NOTE — Telephone Encounter (Signed)
Trish aware. 

## 2014-01-30 NOTE — Telephone Encounter (Signed)
The pt called and stated she is not going to the ER today. Then the pt hung up.

## 2014-01-30 NOTE — Telephone Encounter (Signed)
New problem   Pt called and would like to be seen today.   Pt is having Back pain, Left arm pain and CP this started 2 days ago.   Please give her a call back.

## 2014-03-11 ENCOUNTER — Emergency Department (HOSPITAL_COMMUNITY): Payer: Medicare HMO

## 2014-03-11 ENCOUNTER — Emergency Department (HOSPITAL_COMMUNITY)
Admission: EM | Admit: 2014-03-11 | Discharge: 2014-03-11 | Disposition: A | Discharge status: Home / Self Care | Payer: Medicare HMO | Attending: Emergency Medicine | Admitting: Emergency Medicine

## 2014-03-11 ENCOUNTER — Encounter (HOSPITAL_COMMUNITY): Payer: Self-pay | Admitting: Emergency Medicine

## 2014-03-11 DIAGNOSIS — S298XXA Other specified injuries of thorax, initial encounter: Secondary | ICD-10-CM | POA: Insufficient documentation

## 2014-03-11 DIAGNOSIS — R42 Dizziness and giddiness: Secondary | ICD-10-CM | POA: Insufficient documentation

## 2014-03-11 DIAGNOSIS — S0003XA Contusion of scalp, initial encounter: Secondary | ICD-10-CM | POA: Insufficient documentation

## 2014-03-11 DIAGNOSIS — S1093XA Contusion of unspecified part of neck, initial encounter: Secondary | ICD-10-CM

## 2014-03-11 DIAGNOSIS — S199XXA Unspecified injury of neck, initial encounter: Secondary | ICD-10-CM

## 2014-03-11 DIAGNOSIS — Z8719 Personal history of other diseases of the digestive system: Secondary | ICD-10-CM | POA: Insufficient documentation

## 2014-03-11 DIAGNOSIS — I10 Essential (primary) hypertension: Secondary | ICD-10-CM | POA: Insufficient documentation

## 2014-03-11 DIAGNOSIS — S0990XA Unspecified injury of head, initial encounter: Secondary | ICD-10-CM | POA: Insufficient documentation

## 2014-03-11 DIAGNOSIS — E876 Hypokalemia: Secondary | ICD-10-CM

## 2014-03-11 DIAGNOSIS — IMO0002 Reserved for concepts with insufficient information to code with codable children: Secondary | ICD-10-CM | POA: Insufficient documentation

## 2014-03-11 DIAGNOSIS — Z79899 Other long term (current) drug therapy: Secondary | ICD-10-CM | POA: Insufficient documentation

## 2014-03-11 DIAGNOSIS — M549 Dorsalgia, unspecified: Secondary | ICD-10-CM

## 2014-03-11 DIAGNOSIS — Z7982 Long term (current) use of aspirin: Secondary | ICD-10-CM | POA: Insufficient documentation

## 2014-03-11 DIAGNOSIS — Y92009 Unspecified place in unspecified non-institutional (private) residence as the place of occurrence of the external cause: Secondary | ICD-10-CM | POA: Insufficient documentation

## 2014-03-11 DIAGNOSIS — Y9301 Activity, walking, marching and hiking: Secondary | ICD-10-CM | POA: Insufficient documentation

## 2014-03-11 DIAGNOSIS — S0993XA Unspecified injury of face, initial encounter: Secondary | ICD-10-CM | POA: Insufficient documentation

## 2014-03-11 DIAGNOSIS — W1809XA Striking against other object with subsequent fall, initial encounter: Secondary | ICD-10-CM | POA: Insufficient documentation

## 2014-03-11 DIAGNOSIS — E049 Nontoxic goiter, unspecified: Secondary | ICD-10-CM | POA: Insufficient documentation

## 2014-03-11 DIAGNOSIS — W19XXXA Unspecified fall, initial encounter: Secondary | ICD-10-CM

## 2014-03-11 DIAGNOSIS — S0083XA Contusion of other part of head, initial encounter: Secondary | ICD-10-CM

## 2014-03-11 LAB — I-STAT CHEM 8, ED
BUN: 5 mg/dL — ABNORMAL LOW (ref 6–23)
Calcium, Ion: 1.12 mmol/L — ABNORMAL LOW (ref 1.13–1.30)
Chloride: 94 mEq/L — ABNORMAL LOW (ref 96–112)
Creatinine, Ser: 0.9 mg/dL (ref 0.50–1.10)
Glucose, Bld: 104 mg/dL — ABNORMAL HIGH (ref 70–99)
HCT: 38 % (ref 36.0–46.0)
Hemoglobin: 12.9 g/dL (ref 12.0–15.0)
Potassium: 3.3 mEq/L — ABNORMAL LOW (ref 3.7–5.3)
Sodium: 136 mEq/L — ABNORMAL LOW (ref 137–147)
TCO2: 31 mmol/L (ref 0–100)

## 2014-03-11 LAB — CBC WITH DIFFERENTIAL/PLATELET
Basophils Absolute: 0 10*3/uL (ref 0.0–0.1)
Basophils Relative: 0 % (ref 0–1)
Eosinophils Absolute: 0.4 10*3/uL (ref 0.0–0.7)
Eosinophils Relative: 4 % (ref 0–5)
HCT: 36.1 % (ref 36.0–46.0)
Hemoglobin: 12.4 g/dL (ref 12.0–15.0)
Lymphocytes Relative: 17 % (ref 12–46)
Lymphs Abs: 1.5 10*3/uL (ref 0.7–4.0)
MCH: 31.4 pg (ref 26.0–34.0)
MCHC: 34.3 g/dL (ref 30.0–36.0)
MCV: 91.4 fL (ref 78.0–100.0)
Monocytes Absolute: 0.8 10*3/uL (ref 0.1–1.0)
Monocytes Relative: 9 % (ref 3–12)
Neutro Abs: 6.5 10*3/uL (ref 1.7–7.7)
Neutrophils Relative %: 71 % (ref 43–77)
Platelets: 306 10*3/uL (ref 150–400)
RBC: 3.95 MIL/uL (ref 3.87–5.11)
RDW: 12.9 % (ref 11.5–15.5)
WBC: 9.2 10*3/uL (ref 4.0–10.5)

## 2014-03-11 LAB — I-STAT TROPONIN, ED: Troponin i, poc: 0.01 ng/mL (ref 0.00–0.08)

## 2014-03-11 MED ORDER — HYDROCODONE-ACETAMINOPHEN 5-325 MG PO TABS
1.0000 | ORAL_TABLET | Freq: Once | ORAL | Status: DC
  Filled 2014-03-11: qty 1

## 2014-03-11 MED ORDER — ACETAMINOPHEN 325 MG PO TABS
650.0000 mg | ORAL_TABLET | Freq: Once | ORAL | Status: AC
Start: 2014-03-11 — End: 2014-03-11
  Administered 2014-03-11: 650 mg via ORAL
  Filled 2014-03-11: qty 2

## 2014-03-11 NOTE — ED Provider Notes (Signed)
CSN: [id number]     Arrival date & time 03/11/14  1410 History   First MD Initiated Contact with Patient 03/11/14 1501     Chief Complaint  Patient presents with  . Fall  . Head Injury  . Back Pain     (Consider location/radiation/quality/duration/timing/severity/associated sxs/prior Treatment) HPI Comments: 73 yo female with on asa, htn presents with neck and head pain after a fall PTA.  Pt returned from trip to the beach and was in a hurry to go to the bathroom and lost her step going up stairs into her house, fell back 4 steps hitting buttocks and posterior head, no loc.  Pain entire back without weakness or numbness.  Mild rib pain.  Pain with palpation and rom.  No sxs prior, pt recalls all events.  Pt did defecate on herself, she was in pain at the time, no hx of or witnessed seizures.     Patient is a 73 y.o. female presenting with fall, head injury, and back pain. The history is provided by the patient and a relative.  Fall Associated symptoms include headaches. Pertinent negatives include no chest pain, no abdominal pain and no shortness of breath.  Head Injury Associated symptoms: headache and neck pain   Associated symptoms: no seizures and no vomiting   Back Pain Associated symptoms: headaches   Associated symptoms: no abdominal pain, no chest pain, no dysuria, no fever and no weakness     Past Medical History  Diagnosis Date  . Unspecified essential hypertension   . Thyroid goiter   . Diverticulosis   . Hiatal hernia   . S/P laparoscopic cholecystectomy 07/28/2011    23 years ago   Past Surgical History  Procedure Laterality Date  . Cholecystectomy    . Child birth      x3   Family History  Problem Relation Age of Onset  . Cervical cancer Mother   . Colon cancer Neg Hx    History  Substance Use Topics  . Smoking status: Never Smoker   . Smokeless tobacco: Never Used  . Alcohol Use: No   OB History   Grav Para Term Preterm Abortions TAB SAB Ect Mult  Living                 Review of Systems  Constitutional: Negative for fever and chills.  HENT: Negative for congestion.   Eyes: Negative for visual disturbance.  Respiratory: Negative for shortness of breath.   Cardiovascular: Negative for chest pain.  Gastrointestinal: Negative for vomiting and abdominal pain.  Genitourinary: Negative for dysuria and flank pain.  Musculoskeletal: Positive for arthralgias, back pain and neck pain. Negative for neck stiffness.  Skin: Negative for rash.  Neurological: Positive for headaches. Negative for seizures, syncope, weakness and light-headedness.      Allergies  Codeine  Home Medications   Current Outpatient Rx  Name  Route  Sig  Dispense  Refill  . aspirin 81 MG tablet   Oral   Take 81 mg by mouth daily.           Marland Kitchen atenolol (TENORMIN) 100 MG tablet   Oral   Take 100 mg by mouth daily.           Marland Kitchen b complex vitamins tablet   Oral   Take 1 tablet by mouth daily.           . calcium carbonate (OS-CAL) 600 MG TABS   Oral   Take 600 mg by mouth 2 (  two) times daily with a meal.           . irbesartan-hydrochlorothiazide (AVALIDE) 300-12.5 MG per tablet   Oral   Take 1 tablet by mouth daily.         Marland Kitchen levothyroxine (SYNTHROID, LEVOTHROID) 50 MCG tablet   Oral   Take 50 mcg by mouth daily.           . Multiple Vitamin (MULTIVITAMIN) tablet   Oral   Take 1 tablet by mouth daily.           Marland Kitchen omeprazole (PRILOSEC OTC) 20 MG tablet   Oral   Take 20 mg by mouth daily.            BP 163/71  Pulse 80  Temp(Src) 98.2 F (36.8 C) (Oral)  Resp 16  SpO2 98% Physical Exam  Nursing note and vitals reviewed. Constitutional: She is oriented to person, place, and time. She appears well-developed and well-nourished.  HENT:  Head: Normocephalic.  Mild abrasion posterior scalp, mild hematoma, no lac  Eyes: Conjunctivae are normal. Right eye exhibits no discharge. Left eye exhibits no discharge.  Neck: Normal range  of motion. Neck supple. No tracheal deviation present.  Cardiovascular: Normal rate and regular rhythm.   Pulmonary/Chest: Effort normal and breath sounds normal.  Abdominal: Soft. She exhibits no distension. There is no tenderness. There is no guarding.  Musculoskeletal: She exhibits tenderness. She exhibits no edema.  Tender mild, midline entire spine without step off, also paraspinal tenderness, C collar in place Mild lateral rib tenderness without step off  Neurological: She is alert and oriented to person, place, and time. No cranial nerve deficit. GCS eye subscore is 4. GCS verbal subscore is 5. GCS motor subscore is 6.  5+ strength in UE and LE with f/e at major joints. Sensation to palpation intact in UE and LE. CNs 2-12 grossly intact.  EOMFI.  PERRL.   Finger nose and coordination intact bilateral.   Visual fields intact to finger testing.   Skin: Skin is warm. No rash noted.  Mild posterior scalp hematoma  Psychiatric: She has a normal mood and affect.    ED Course  Procedures (including critical care time) Labs Review Labs Reviewed  I-STAT CHEM 8, ED - Abnormal; Notable for the following:    Sodium 136 (*)    Potassium 3.3 (*)    Chloride 94 (*)    BUN 5 (*)    Glucose, Bld 104 (*)    Calcium, Ion 1.12 (*)    All other components within normal limits  CBC WITH DIFFERENTIAL  Randolm Idol, ED   Imaging Review Dg Chest 2 View  03/11/2014   CLINICAL DATA:  Pain after fall.  EXAM: CHEST  2 VIEW  COMPARISON:  December 24, 2012.  FINDINGS: Stable cardiomegaly. Anterior osteophyte formation is seen in the lower thoracic spine. No pneumothorax or pleural effusion is noted. Mild degenerative changes seen involving the acromioclavicular joints bilaterally. No acute pulmonary disease is noted.  IMPRESSION: No acute cardiopulmonary abnormality seen.   Electronically Signed   By: Sabino Dick M.D.   On: 03/11/2014 16:26   Dg Thoracic Spine 2 View  03/11/2014   CLINICAL DATA:   Followup with back pain.  EXAM: THORACIC SPINE - 2 VIEW  COMPARISON:  Chest radiograph 03/11/2014  FINDINGS: Fairly extensive multilevel degenerative changes in thoracic spine noted, with prominent osteophyte formation. Vertebral bodies are normal in height and alignment. No acute fractures are appreciated. The imaged  lung fields are clear.  IMPRESSION: No acute bony abnormality identified in the thoracic spine.  Multilevel degenerative changes.   Electronically Signed   By: Curlene Dolphin M.D.   On: 03/11/2014 16:28   Dg Lumbar Spine Complete  03/11/2014   CLINICAL DATA:  Fall with back pain.  EXAM: LUMBAR SPINE - COMPLETE 4+ VIEW  COMPARISON:  Lumbar spine radiographs 12/24/2012 and thoracic spine radiographs 03/11/2014  FINDINGS: There is no evidence of lumbar spine fracture. Alignment is normal. There is disc height narrowing and anterior osteophyte formation at L2-3 and slight disc height narrowing at L1-L2. Prominent facet joint degenerative changes at L4-5 and L5-S1. No acute fracture identified. Cholecystectomy clips in right upper quadrant.  IMPRESSION: No acute bony abnormality.  Degenerative changes as described above.   Electronically Signed   By: Curlene Dolphin M.D.   On: 03/11/2014 16:30   Ct Head Wo Contrast  03/11/2014   CLINICAL DATA:  Fall with head injury and back pain.  EXAM: CT HEAD WITHOUT CONTRAST  CT CERVICAL SPINE WITHOUT CONTRAST  TECHNIQUE: Multidetector CT imaging of the head and cervical spine was performed following the standard protocol without intravenous contrast. Multiplanar CT image reconstructions of the cervical spine were also generated.  COMPARISON:  Head CT 07/24/2008  FINDINGS: CT HEAD FINDINGS  No acute intracranial abnormality is identified. Specifically, no hemorrhage, mass effect, hydrocephalus, midline shift, or evidence of acute cortically based infarction. Patient is status post bilateral lens extraction. The globes are intact. Scalp soft tissues are symmetric. The  skull is intact. There is a circumscribed and partially calcified rounded left frontal skull lesion measuring 10 mm. There is are compared to head CT of 2009 given differences in technique and likely reflects a chronic sebaceous cyst. The imaged paranasal sinuses and the mastoid air cells are clear. The skull is intact.  CT CERVICAL SPINE FINDINGS  The cervical spine is imaged from the skullbase through the T2 vertebral body. Cervical spine vertebral bodies are normal in height and alignment. There is disc height narrowing at multiple levels, most prominent at C5-6, C6-7, and T1-T2. There is prominent anterior osteophyte formation in the mid to lower cervical and upper thoracic spine. Multilevel facet joint degenerative changes are present, most prominent on the left at C4-C5, resulting in moderate left neural foraminal narrowing. Less significant facet joint degenerative changes noted bilaterally at multiple levels.  Negative for acute cervical spine fracture. The prevertebral soft tissue contour is within normal limits. Lung apices are clear.  IMPRESSION: 1. No acute intracranial abnormality. 2. Probable chronic sebaceous cyst left frontal scalp. 3. No evidence of acute bony injury to the cervical spine. 4. Multilevel cervical spine degenerative disc disease and facet joint degenerative change.   Electronically Signed   By: Curlene Dolphin M.D.   On: 03/11/2014 16:05   Ct Cervical Spine Wo Contrast  03/11/2014   CLINICAL DATA:  Fall with head injury and back pain.  EXAM: CT HEAD WITHOUT CONTRAST  CT CERVICAL SPINE WITHOUT CONTRAST  TECHNIQUE: Multidetector CT imaging of the head and cervical spine was performed following the standard protocol without intravenous contrast. Multiplanar CT image reconstructions of the cervical spine were also generated.  COMPARISON:  Head CT 07/24/2008  FINDINGS: CT HEAD FINDINGS  No acute intracranial abnormality is identified. Specifically, no hemorrhage, mass effect,  hydrocephalus, midline shift, or evidence of acute cortically based infarction. Patient is status post bilateral lens extraction. The globes are intact. Scalp soft tissues are symmetric. The skull is  intact. There is a circumscribed and partially calcified rounded left frontal skull lesion measuring 10 mm. There is are compared to head CT of 2009 given differences in technique and likely reflects a chronic sebaceous cyst. The imaged paranasal sinuses and the mastoid air cells are clear. The skull is intact.  CT CERVICAL SPINE FINDINGS  The cervical spine is imaged from the skullbase through the T2 vertebral body. Cervical spine vertebral bodies are normal in height and alignment. There is disc height narrowing at multiple levels, most prominent at C5-6, C6-7, and T1-T2. There is prominent anterior osteophyte formation in the mid to lower cervical and upper thoracic spine. Multilevel facet joint degenerative changes are present, most prominent on the left at C4-C5, resulting in moderate left neural foraminal narrowing. Less significant facet joint degenerative changes noted bilaterally at multiple levels.  Negative for acute cervical spine fracture. The prevertebral soft tissue contour is within normal limits. Lung apices are clear.  IMPRESSION: 1. No acute intracranial abnormality. 2. Probable chronic sebaceous cyst left frontal scalp. 3. No evidence of acute bony injury to the cervical spine. 4. Multilevel cervical spine degenerative disc disease and facet joint degenerative change.   Electronically Signed   By: Curlene Dolphin M.D.   On: 03/11/2014 16:05     EKG Interpretation   Date/Time:  Saturday March 11 2014 16:34:42 EDT Ventricular Rate:  70 PR Interval:  141 QRS Duration: 86 QT Interval:  415 QTC Calculation: 448 R Axis:   72 Text Interpretation:  Sinus rhythm Multiple ventricular premature  complexes Confirmed by Keymari Sato  MD, Nimrit Kehres ([id number]) on 03/11/2014 5:55:20 PM      MDM   Final  diagnoses:  Fall  Back pain  Hypokalemia   Consistent with mechanical fall, aside from pain pt at baseline, no cp, sob or syncope.  Plan for xrays, CT scans and basic blood work/ ekg with defication episode. Pain medicines in ED.   On recheck pt mild dizziness with walking however able to walk without assistance.  Discussed giving IV fluids and recheck in 1 hr.  Family with pt and pt wishing to go home and fup outpt.  She understands she can return at any time.  Repeat neuro exam, CNs normal, normal strength, gait mild cautious.   Family comfortable with plan.    Results and differential diagnosis were discussed with the patient. Close follow up outpatient was discussed, patient comfortable with the plan.   Filed Vitals:   03/11/14 1410 03/11/14 1412 03/11/14 1817  BP:  163/71 160/71  Pulse:  80 68  Temp:  98.2 F (36.8 C)   TempSrc:  Oral   Resp:  16 16  SpO2: 97% 98% 96%         Mariea Clonts, MD 03/12/14 [phone]

## 2014-03-11 NOTE — Discharge Instructions (Signed)
If you were given medicines take as directed.  If you are on coumadin or contraceptives realize their levels and effectiveness is altered by many different medicines.  If you have any reaction (rash, tongues swelling, other) to the medicines stop taking and see a physician.   Please follow up as directed and return to the ER or see a physician for new or worsening symptoms.  Thank you. Filed Vitals:   03/11/14 1410 03/11/14 1412  BP:  163/71  Pulse:  80  Temp:  98.2 F (36.8 C)  TempSrc:  Oral  Resp:  16  SpO2: 97% 98%

## 2014-03-11 NOTE — ED Notes (Signed)
Pt arrived on [facility] via EMS. Pt was log rolled and checked with hope, NT and Tim, Therapist, sports. Pt denies pain on palpation, just diffuse back pain all over.

## 2014-03-11 NOTE — ED Notes (Signed)
Pt from home- reports that she lost her balance, hitting her head on a brick patio. Pt denies LOC, but states that she has back pain all over and pain to back of her head. Pt is A&O and in NAD

## 2014-03-11 NOTE — ED Notes (Addendum)
Pt from home vis EMS-per EMS pt was walking into her house, grabbed the door handle but missed falling backwards hitting her head on cement patio. Pt has hematoma to back of head, c/o upper back pain that radiates to ribs. Pt denies  LOC and neck pain. Pt is A&O and in NAD

## 2014-03-11 NOTE — ED Notes (Signed)
Bed: WA20 Expected date:  Expected time:  Means of arrival:  Comments: fall 

## 2014-03-11 NOTE — ED Notes (Addendum)
Pt family and pt concerned that pt is being d/c. Dr Reather Converse notified and explained to family that pt xrays were negative, that pt would be in pain for a few days from fall. If gets worse, to see PCP or ED for follow up with MRI. Pt stated after that she wants to go home and doesn't want to stay. Pt is A&O and in NAD.

## 2014-03-11 NOTE — ED Notes (Signed)
Patient transported to CT 

## 2014-09-14 ENCOUNTER — Encounter: Payer: Self-pay | Admitting: Gastroenterology

## 2014-11-07 ENCOUNTER — Other Ambulatory Visit: Payer: Self-pay | Admitting: Family Medicine

## 2014-11-07 DIAGNOSIS — M5416 Radiculopathy, lumbar region: Secondary | ICD-10-CM

## 2014-11-16 ENCOUNTER — Ambulatory Visit
Admission: RE | Admit: 2014-11-16 | Discharge: 2014-11-16 | Disposition: A | Discharge status: Home / Self Care | Payer: Medicare HMO | Source: Ambulatory Visit | Attending: Family Medicine | Admitting: Family Medicine

## 2014-11-16 DIAGNOSIS — M5416 Radiculopathy, lumbar region: Secondary | ICD-10-CM

## 2015-08-17 ENCOUNTER — Other Ambulatory Visit: Payer: Self-pay | Admitting: Family Medicine

## 2015-08-17 DIAGNOSIS — E041 Nontoxic single thyroid nodule: Secondary | ICD-10-CM

## 2015-08-21 ENCOUNTER — Ambulatory Visit
Admission: RE | Admit: 2015-08-21 | Discharge: 2015-08-21 | Disposition: A | Discharge status: Home / Self Care | Payer: PPO | Source: Ambulatory Visit | Attending: Family Medicine | Admitting: Family Medicine

## 2015-08-21 DIAGNOSIS — E041 Nontoxic single thyroid nodule: Secondary | ICD-10-CM

## 2015-09-03 ENCOUNTER — Other Ambulatory Visit: Payer: Self-pay | Admitting: Family Medicine

## 2015-09-03 DIAGNOSIS — E041 Nontoxic single thyroid nodule: Secondary | ICD-10-CM

## 2015-09-04 ENCOUNTER — Other Ambulatory Visit (HOSPITAL_COMMUNITY)
Admission: RE | Admit: 2015-09-04 | Discharge: 2015-09-04 | Disposition: A | Discharge status: Home / Self Care | Payer: PPO | Source: Ambulatory Visit | Attending: Physician Assistant | Admitting: Physician Assistant

## 2015-09-04 ENCOUNTER — Ambulatory Visit
Admission: RE | Admit: 2015-09-04 | Discharge: 2015-09-04 | Disposition: A | Discharge status: Home / Self Care | Payer: PPO | Source: Ambulatory Visit | Attending: Family Medicine | Admitting: Family Medicine

## 2015-09-04 DIAGNOSIS — E041 Nontoxic single thyroid nodule: Secondary | ICD-10-CM

## 2015-09-04 NOTE — Procedures (Signed)
Using direct ultrasound guidance, 4 passes were made using needles into the nodule within the right lobe of the thyroid.   Ultrasound was used to confirm needle placements on all occasions.   Specimens were sent to Pathology for analysis.  Sylvain Hasten S Oluwateniola Leitch PA-C 09/04/2015 11:31 AM

## 2015-12-18 DIAGNOSIS — G4733 Obstructive sleep apnea (adult) (pediatric): Secondary | ICD-10-CM | POA: Diagnosis not present

## 2015-12-19 DIAGNOSIS — M4696 Unspecified inflammatory spondylopathy, lumbar region: Secondary | ICD-10-CM | POA: Diagnosis not present

## 2015-12-19 DIAGNOSIS — Z6832 Body mass index (BMI) 32.0-32.9, adult: Secondary | ICD-10-CM | POA: Diagnosis not present

## 2015-12-19 DIAGNOSIS — M5440 Lumbago with sciatica, unspecified side: Secondary | ICD-10-CM | POA: Diagnosis not present

## 2015-12-19 DIAGNOSIS — M4317 Spondylolisthesis, lumbosacral region: Secondary | ICD-10-CM | POA: Diagnosis not present

## 2015-12-23 DIAGNOSIS — G4733 Obstructive sleep apnea (adult) (pediatric): Secondary | ICD-10-CM | POA: Diagnosis not present

## 2016-01-23 DIAGNOSIS — G4733 Obstructive sleep apnea (adult) (pediatric): Secondary | ICD-10-CM | POA: Diagnosis not present

## 2016-02-13 DIAGNOSIS — I10 Essential (primary) hypertension: Secondary | ICD-10-CM | POA: Diagnosis not present

## 2016-02-13 DIAGNOSIS — F4320 Adjustment disorder, unspecified: Secondary | ICD-10-CM | POA: Diagnosis not present

## 2016-02-13 DIAGNOSIS — F419 Anxiety disorder, unspecified: Secondary | ICD-10-CM | POA: Diagnosis not present

## 2016-02-13 DIAGNOSIS — E7800 Pure hypercholesterolemia, unspecified: Secondary | ICD-10-CM | POA: Diagnosis not present

## 2016-02-13 DIAGNOSIS — G4700 Insomnia, unspecified: Secondary | ICD-10-CM | POA: Diagnosis not present

## 2016-02-13 DIAGNOSIS — K219 Gastro-esophageal reflux disease without esophagitis: Secondary | ICD-10-CM | POA: Diagnosis not present

## 2016-02-20 DIAGNOSIS — G4733 Obstructive sleep apnea (adult) (pediatric): Secondary | ICD-10-CM | POA: Diagnosis not present

## 2016-02-25 DIAGNOSIS — M5440 Lumbago with sciatica, unspecified side: Secondary | ICD-10-CM | POA: Diagnosis not present

## 2016-02-25 DIAGNOSIS — M4696 Unspecified inflammatory spondylopathy, lumbar region: Secondary | ICD-10-CM | POA: Diagnosis not present

## 2016-02-25 DIAGNOSIS — M4317 Spondylolisthesis, lumbosacral region: Secondary | ICD-10-CM | POA: Diagnosis not present

## 2016-03-22 DIAGNOSIS — G4733 Obstructive sleep apnea (adult) (pediatric): Secondary | ICD-10-CM | POA: Diagnosis not present

## 2016-04-01 DIAGNOSIS — M4317 Spondylolisthesis, lumbosacral region: Secondary | ICD-10-CM | POA: Diagnosis not present

## 2016-04-01 DIAGNOSIS — Z6833 Body mass index (BMI) 33.0-33.9, adult: Secondary | ICD-10-CM | POA: Diagnosis not present

## 2016-04-01 DIAGNOSIS — I10 Essential (primary) hypertension: Secondary | ICD-10-CM | POA: Diagnosis not present

## 2016-04-02 DIAGNOSIS — L821 Other seborrheic keratosis: Secondary | ICD-10-CM | POA: Diagnosis not present

## 2016-04-10 ENCOUNTER — Ambulatory Visit
Admission: RE | Admit: 2016-04-10 | Discharge: 2016-04-10 | Disposition: A | Discharge status: Home / Self Care | Payer: PPO | Source: Ambulatory Visit | Attending: Family Medicine | Admitting: Family Medicine

## 2016-04-10 ENCOUNTER — Other Ambulatory Visit: Payer: Self-pay | Admitting: Family Medicine

## 2016-04-10 DIAGNOSIS — R059 Cough, unspecified: Secondary | ICD-10-CM

## 2016-04-10 DIAGNOSIS — R05 Cough: Secondary | ICD-10-CM

## 2016-04-10 DIAGNOSIS — R509 Fever, unspecified: Secondary | ICD-10-CM

## 2016-04-21 DIAGNOSIS — G4733 Obstructive sleep apnea (adult) (pediatric): Secondary | ICD-10-CM | POA: Diagnosis not present

## 2016-04-30 DIAGNOSIS — M7551 Bursitis of right shoulder: Secondary | ICD-10-CM | POA: Diagnosis not present

## 2016-04-30 DIAGNOSIS — J069 Acute upper respiratory infection, unspecified: Secondary | ICD-10-CM | POA: Diagnosis not present

## 2016-05-02 DIAGNOSIS — G4733 Obstructive sleep apnea (adult) (pediatric): Secondary | ICD-10-CM | POA: Diagnosis not present

## 2016-05-02 DIAGNOSIS — M545 Low back pain: Secondary | ICD-10-CM | POA: Diagnosis not present

## 2016-05-08 DIAGNOSIS — L821 Other seborrheic keratosis: Secondary | ICD-10-CM | POA: Diagnosis not present

## 2016-05-08 DIAGNOSIS — B009 Herpesviral infection, unspecified: Secondary | ICD-10-CM | POA: Diagnosis not present

## 2016-05-19 DIAGNOSIS — Z961 Presence of intraocular lens: Secondary | ICD-10-CM | POA: Diagnosis not present

## 2016-05-22 DIAGNOSIS — G4733 Obstructive sleep apnea (adult) (pediatric): Secondary | ICD-10-CM | POA: Diagnosis not present

## 2016-06-10 DIAGNOSIS — Z01419 Encounter for gynecological examination (general) (routine) without abnormal findings: Secondary | ICD-10-CM | POA: Diagnosis not present

## 2016-06-16 DIAGNOSIS — Z1231 Encounter for screening mammogram for malignant neoplasm of breast: Secondary | ICD-10-CM | POA: Diagnosis not present

## 2016-06-17 DIAGNOSIS — G4733 Obstructive sleep apnea (adult) (pediatric): Secondary | ICD-10-CM | POA: Diagnosis not present

## 2016-06-21 DIAGNOSIS — G4733 Obstructive sleep apnea (adult) (pediatric): Secondary | ICD-10-CM | POA: Diagnosis not present

## 2016-06-23 DIAGNOSIS — G4733 Obstructive sleep apnea (adult) (pediatric): Secondary | ICD-10-CM | POA: Diagnosis not present

## 2016-07-16 DIAGNOSIS — M2550 Pain in unspecified joint: Secondary | ICD-10-CM | POA: Diagnosis not present

## 2016-07-16 DIAGNOSIS — M545 Low back pain: Secondary | ICD-10-CM | POA: Diagnosis not present

## 2016-07-22 DIAGNOSIS — G4733 Obstructive sleep apnea (adult) (pediatric): Secondary | ICD-10-CM | POA: Diagnosis not present

## 2016-07-28 DIAGNOSIS — M4317 Spondylolisthesis, lumbosacral region: Secondary | ICD-10-CM | POA: Diagnosis not present

## 2016-08-12 DIAGNOSIS — M545 Low back pain: Secondary | ICD-10-CM | POA: Diagnosis not present

## 2016-08-12 DIAGNOSIS — M25561 Pain in right knee: Secondary | ICD-10-CM | POA: Diagnosis not present

## 2016-08-12 DIAGNOSIS — M25562 Pain in left knee: Secondary | ICD-10-CM | POA: Diagnosis not present

## 2016-08-12 DIAGNOSIS — R262 Difficulty in walking, not elsewhere classified: Secondary | ICD-10-CM | POA: Diagnosis not present

## 2016-08-14 DIAGNOSIS — M545 Low back pain: Secondary | ICD-10-CM | POA: Diagnosis not present

## 2016-08-14 DIAGNOSIS — M25561 Pain in right knee: Secondary | ICD-10-CM | POA: Diagnosis not present

## 2016-08-14 DIAGNOSIS — R262 Difficulty in walking, not elsewhere classified: Secondary | ICD-10-CM | POA: Diagnosis not present

## 2016-08-14 DIAGNOSIS — M25562 Pain in left knee: Secondary | ICD-10-CM | POA: Diagnosis not present

## 2016-08-19 DIAGNOSIS — M25561 Pain in right knee: Secondary | ICD-10-CM | POA: Diagnosis not present

## 2016-08-19 DIAGNOSIS — M545 Low back pain: Secondary | ICD-10-CM | POA: Diagnosis not present

## 2016-08-19 DIAGNOSIS — M25562 Pain in left knee: Secondary | ICD-10-CM | POA: Diagnosis not present

## 2016-08-19 DIAGNOSIS — R262 Difficulty in walking, not elsewhere classified: Secondary | ICD-10-CM | POA: Diagnosis not present

## 2016-08-21 DIAGNOSIS — M25561 Pain in right knee: Secondary | ICD-10-CM | POA: Diagnosis not present

## 2016-08-21 DIAGNOSIS — M545 Low back pain: Secondary | ICD-10-CM | POA: Diagnosis not present

## 2016-08-21 DIAGNOSIS — M25562 Pain in left knee: Secondary | ICD-10-CM | POA: Diagnosis not present

## 2016-08-21 DIAGNOSIS — R262 Difficulty in walking, not elsewhere classified: Secondary | ICD-10-CM | POA: Diagnosis not present

## 2016-08-25 DIAGNOSIS — M25561 Pain in right knee: Secondary | ICD-10-CM | POA: Diagnosis not present

## 2016-08-25 DIAGNOSIS — M545 Low back pain: Secondary | ICD-10-CM | POA: Diagnosis not present

## 2016-08-25 DIAGNOSIS — R262 Difficulty in walking, not elsewhere classified: Secondary | ICD-10-CM | POA: Diagnosis not present

## 2016-08-25 DIAGNOSIS — M25562 Pain in left knee: Secondary | ICD-10-CM | POA: Diagnosis not present

## 2016-08-28 DIAGNOSIS — M545 Low back pain: Secondary | ICD-10-CM | POA: Diagnosis not present

## 2016-08-28 DIAGNOSIS — M25562 Pain in left knee: Secondary | ICD-10-CM | POA: Diagnosis not present

## 2016-08-28 DIAGNOSIS — R262 Difficulty in walking, not elsewhere classified: Secondary | ICD-10-CM | POA: Diagnosis not present

## 2016-08-28 DIAGNOSIS — M25561 Pain in right knee: Secondary | ICD-10-CM | POA: Diagnosis not present

## 2016-09-02 DIAGNOSIS — R262 Difficulty in walking, not elsewhere classified: Secondary | ICD-10-CM | POA: Diagnosis not present

## 2016-09-02 DIAGNOSIS — M25561 Pain in right knee: Secondary | ICD-10-CM | POA: Diagnosis not present

## 2016-09-02 DIAGNOSIS — M545 Low back pain: Secondary | ICD-10-CM | POA: Diagnosis not present

## 2016-09-02 DIAGNOSIS — M25562 Pain in left knee: Secondary | ICD-10-CM | POA: Diagnosis not present

## 2016-09-03 DIAGNOSIS — M545 Low back pain: Secondary | ICD-10-CM | POA: Diagnosis not present

## 2016-09-03 DIAGNOSIS — M25562 Pain in left knee: Secondary | ICD-10-CM | POA: Diagnosis not present

## 2016-09-03 DIAGNOSIS — R262 Difficulty in walking, not elsewhere classified: Secondary | ICD-10-CM | POA: Diagnosis not present

## 2016-09-03 DIAGNOSIS — M25561 Pain in right knee: Secondary | ICD-10-CM | POA: Diagnosis not present

## 2016-09-05 DIAGNOSIS — Z23 Encounter for immunization: Secondary | ICD-10-CM | POA: Diagnosis not present

## 2016-09-08 DIAGNOSIS — M4317 Spondylolisthesis, lumbosacral region: Secondary | ICD-10-CM | POA: Diagnosis not present

## 2016-09-08 DIAGNOSIS — Z6834 Body mass index (BMI) 34.0-34.9, adult: Secondary | ICD-10-CM | POA: Diagnosis not present

## 2016-09-08 DIAGNOSIS — I10 Essential (primary) hypertension: Secondary | ICD-10-CM | POA: Diagnosis not present

## 2016-09-09 DIAGNOSIS — M25561 Pain in right knee: Secondary | ICD-10-CM | POA: Diagnosis not present

## 2016-09-09 DIAGNOSIS — M545 Low back pain: Secondary | ICD-10-CM | POA: Diagnosis not present

## 2016-09-09 DIAGNOSIS — R262 Difficulty in walking, not elsewhere classified: Secondary | ICD-10-CM | POA: Diagnosis not present

## 2016-09-09 DIAGNOSIS — M25562 Pain in left knee: Secondary | ICD-10-CM | POA: Diagnosis not present

## 2016-09-12 DIAGNOSIS — M545 Low back pain: Secondary | ICD-10-CM | POA: Diagnosis not present

## 2016-09-12 DIAGNOSIS — R262 Difficulty in walking, not elsewhere classified: Secondary | ICD-10-CM | POA: Diagnosis not present

## 2016-09-12 DIAGNOSIS — M25561 Pain in right knee: Secondary | ICD-10-CM | POA: Diagnosis not present

## 2016-09-12 DIAGNOSIS — M25562 Pain in left knee: Secondary | ICD-10-CM | POA: Diagnosis not present

## 2016-09-15 DIAGNOSIS — M25561 Pain in right knee: Secondary | ICD-10-CM | POA: Diagnosis not present

## 2016-09-15 DIAGNOSIS — R262 Difficulty in walking, not elsewhere classified: Secondary | ICD-10-CM | POA: Diagnosis not present

## 2016-09-15 DIAGNOSIS — M545 Low back pain: Secondary | ICD-10-CM | POA: Diagnosis not present

## 2016-09-15 DIAGNOSIS — M25562 Pain in left knee: Secondary | ICD-10-CM | POA: Diagnosis not present

## 2016-09-17 DIAGNOSIS — R262 Difficulty in walking, not elsewhere classified: Secondary | ICD-10-CM | POA: Diagnosis not present

## 2016-09-17 DIAGNOSIS — M545 Low back pain: Secondary | ICD-10-CM | POA: Diagnosis not present

## 2016-09-17 DIAGNOSIS — M25561 Pain in right knee: Secondary | ICD-10-CM | POA: Diagnosis not present

## 2016-09-17 DIAGNOSIS — M25562 Pain in left knee: Secondary | ICD-10-CM | POA: Diagnosis not present

## 2016-09-23 DIAGNOSIS — M25562 Pain in left knee: Secondary | ICD-10-CM | POA: Diagnosis not present

## 2016-09-23 DIAGNOSIS — M545 Low back pain: Secondary | ICD-10-CM | POA: Diagnosis not present

## 2016-09-23 DIAGNOSIS — M25561 Pain in right knee: Secondary | ICD-10-CM | POA: Diagnosis not present

## 2016-09-23 DIAGNOSIS — R262 Difficulty in walking, not elsewhere classified: Secondary | ICD-10-CM | POA: Diagnosis not present

## 2016-09-26 DIAGNOSIS — M25561 Pain in right knee: Secondary | ICD-10-CM | POA: Diagnosis not present

## 2016-09-26 DIAGNOSIS — M25562 Pain in left knee: Secondary | ICD-10-CM | POA: Diagnosis not present

## 2016-09-26 DIAGNOSIS — R262 Difficulty in walking, not elsewhere classified: Secondary | ICD-10-CM | POA: Diagnosis not present

## 2016-09-26 DIAGNOSIS — M545 Low back pain: Secondary | ICD-10-CM | POA: Diagnosis not present

## 2016-09-26 DIAGNOSIS — N3000 Acute cystitis without hematuria: Secondary | ICD-10-CM | POA: Diagnosis not present

## 2016-10-02 DIAGNOSIS — G4733 Obstructive sleep apnea (adult) (pediatric): Secondary | ICD-10-CM | POA: Diagnosis not present

## 2016-10-28 DIAGNOSIS — R6889 Other general symptoms and signs: Secondary | ICD-10-CM | POA: Diagnosis not present

## 2016-10-28 DIAGNOSIS — J069 Acute upper respiratory infection, unspecified: Secondary | ICD-10-CM | POA: Diagnosis not present

## 2016-10-28 DIAGNOSIS — A084 Viral intestinal infection, unspecified: Secondary | ICD-10-CM | POA: Diagnosis not present

## 2016-10-29 DIAGNOSIS — M545 Low back pain: Secondary | ICD-10-CM | POA: Diagnosis not present

## 2016-10-29 DIAGNOSIS — G4733 Obstructive sleep apnea (adult) (pediatric): Secondary | ICD-10-CM | POA: Diagnosis not present

## 2016-10-30 DIAGNOSIS — G4733 Obstructive sleep apnea (adult) (pediatric): Secondary | ICD-10-CM | POA: Diagnosis not present

## 2016-10-30 DIAGNOSIS — M545 Low back pain: Secondary | ICD-10-CM | POA: Diagnosis not present

## 2016-11-04 DIAGNOSIS — J209 Acute bronchitis, unspecified: Secondary | ICD-10-CM | POA: Diagnosis not present

## 2016-12-08 DIAGNOSIS — I10 Essential (primary) hypertension: Secondary | ICD-10-CM | POA: Diagnosis not present

## 2016-12-08 DIAGNOSIS — Z6834 Body mass index (BMI) 34.0-34.9, adult: Secondary | ICD-10-CM | POA: Diagnosis not present

## 2016-12-08 DIAGNOSIS — M4317 Spondylolisthesis, lumbosacral region: Secondary | ICD-10-CM | POA: Diagnosis not present

## 2017-01-06 DIAGNOSIS — M159 Polyosteoarthritis, unspecified: Secondary | ICD-10-CM | POA: Diagnosis not present

## 2017-01-06 DIAGNOSIS — F419 Anxiety disorder, unspecified: Secondary | ICD-10-CM | POA: Diagnosis not present

## 2017-01-06 DIAGNOSIS — M545 Low back pain: Secondary | ICD-10-CM | POA: Diagnosis not present

## 2017-01-06 DIAGNOSIS — E039 Hypothyroidism, unspecified: Secondary | ICD-10-CM | POA: Diagnosis not present

## 2017-02-19 DIAGNOSIS — G4733 Obstructive sleep apnea (adult) (pediatric): Secondary | ICD-10-CM | POA: Diagnosis not present

## 2017-02-20 DIAGNOSIS — M19042 Primary osteoarthritis, left hand: Secondary | ICD-10-CM | POA: Diagnosis not present

## 2017-02-20 DIAGNOSIS — M79641 Pain in right hand: Secondary | ICD-10-CM | POA: Diagnosis not present

## 2017-02-20 DIAGNOSIS — M19041 Primary osteoarthritis, right hand: Secondary | ICD-10-CM | POA: Diagnosis not present

## 2017-02-20 DIAGNOSIS — M79642 Pain in left hand: Secondary | ICD-10-CM | POA: Diagnosis not present

## 2017-03-03 DIAGNOSIS — I10 Essential (primary) hypertension: Secondary | ICD-10-CM | POA: Diagnosis not present

## 2017-03-03 DIAGNOSIS — K219 Gastro-esophageal reflux disease without esophagitis: Secondary | ICD-10-CM | POA: Diagnosis not present

## 2017-03-03 DIAGNOSIS — Z0000 Encounter for general adult medical examination without abnormal findings: Secondary | ICD-10-CM | POA: Diagnosis not present

## 2017-03-03 DIAGNOSIS — N390 Urinary tract infection, site not specified: Secondary | ICD-10-CM | POA: Diagnosis not present

## 2017-03-03 DIAGNOSIS — F324 Major depressive disorder, single episode, in partial remission: Secondary | ICD-10-CM | POA: Diagnosis not present

## 2017-03-03 DIAGNOSIS — E039 Hypothyroidism, unspecified: Secondary | ICD-10-CM | POA: Diagnosis not present

## 2017-03-03 DIAGNOSIS — M6530 Trigger finger, unspecified finger: Secondary | ICD-10-CM | POA: Diagnosis not present

## 2017-03-03 DIAGNOSIS — G4700 Insomnia, unspecified: Secondary | ICD-10-CM | POA: Diagnosis not present

## 2017-03-03 DIAGNOSIS — G629 Polyneuropathy, unspecified: Secondary | ICD-10-CM | POA: Diagnosis not present

## 2017-03-03 DIAGNOSIS — F419 Anxiety disorder, unspecified: Secondary | ICD-10-CM | POA: Diagnosis not present

## 2017-03-19 DIAGNOSIS — R079 Chest pain, unspecified: Secondary | ICD-10-CM | POA: Diagnosis not present

## 2017-03-19 DIAGNOSIS — K219 Gastro-esophageal reflux disease without esophagitis: Secondary | ICD-10-CM | POA: Diagnosis not present

## 2017-03-19 DIAGNOSIS — F419 Anxiety disorder, unspecified: Secondary | ICD-10-CM | POA: Diagnosis not present

## 2017-04-30 DIAGNOSIS — G4700 Insomnia, unspecified: Secondary | ICD-10-CM | POA: Diagnosis not present

## 2017-04-30 DIAGNOSIS — K219 Gastro-esophageal reflux disease without esophagitis: Secondary | ICD-10-CM | POA: Diagnosis not present

## 2017-04-30 DIAGNOSIS — G629 Polyneuropathy, unspecified: Secondary | ICD-10-CM | POA: Diagnosis not present

## 2017-05-04 DIAGNOSIS — L814 Other melanin hyperpigmentation: Secondary | ICD-10-CM | POA: Diagnosis not present

## 2017-05-04 DIAGNOSIS — Z85828 Personal history of other malignant neoplasm of skin: Secondary | ICD-10-CM | POA: Diagnosis not present

## 2017-05-04 DIAGNOSIS — L720 Epidermal cyst: Secondary | ICD-10-CM | POA: Diagnosis not present

## 2017-05-04 DIAGNOSIS — D1801 Hemangioma of skin and subcutaneous tissue: Secondary | ICD-10-CM | POA: Diagnosis not present

## 2017-05-04 DIAGNOSIS — L821 Other seborrheic keratosis: Secondary | ICD-10-CM | POA: Diagnosis not present

## 2017-05-11 DIAGNOSIS — M4317 Spondylolisthesis, lumbosacral region: Secondary | ICD-10-CM | POA: Diagnosis not present

## 2017-05-11 DIAGNOSIS — M545 Low back pain: Secondary | ICD-10-CM | POA: Diagnosis not present

## 2017-05-20 DIAGNOSIS — Z961 Presence of intraocular lens: Secondary | ICD-10-CM | POA: Diagnosis not present

## 2017-05-28 DIAGNOSIS — G4733 Obstructive sleep apnea (adult) (pediatric): Secondary | ICD-10-CM | POA: Diagnosis not present

## 2017-06-10 DIAGNOSIS — M5440 Lumbago with sciatica, unspecified side: Secondary | ICD-10-CM | POA: Diagnosis not present

## 2017-06-10 DIAGNOSIS — M4696 Unspecified inflammatory spondylopathy, lumbar region: Secondary | ICD-10-CM | POA: Diagnosis not present

## 2017-06-10 DIAGNOSIS — M4317 Spondylolisthesis, lumbosacral region: Secondary | ICD-10-CM | POA: Diagnosis not present

## 2017-06-16 DIAGNOSIS — Z01419 Encounter for gynecological examination (general) (routine) without abnormal findings: Secondary | ICD-10-CM | POA: Diagnosis not present

## 2017-07-07 DIAGNOSIS — M4696 Unspecified inflammatory spondylopathy, lumbar region: Secondary | ICD-10-CM | POA: Diagnosis not present

## 2017-07-27 DIAGNOSIS — M5440 Lumbago with sciatica, unspecified side: Secondary | ICD-10-CM | POA: Diagnosis not present

## 2017-07-27 DIAGNOSIS — M4317 Spondylolisthesis, lumbosacral region: Secondary | ICD-10-CM | POA: Diagnosis not present

## 2017-07-28 ENCOUNTER — Emergency Department (HOSPITAL_COMMUNITY): Payer: PPO

## 2017-07-28 ENCOUNTER — Encounter (HOSPITAL_COMMUNITY): Payer: Self-pay | Admitting: Emergency Medicine

## 2017-07-28 ENCOUNTER — Emergency Department (HOSPITAL_COMMUNITY)
Admission: EM | Admit: 2017-07-28 | Discharge: 2017-07-28 | Discharge status: Against Medical Advice | Payer: PPO | Attending: Emergency Medicine | Admitting: Emergency Medicine

## 2017-07-28 DIAGNOSIS — R079 Chest pain, unspecified: Secondary | ICD-10-CM | POA: Diagnosis not present

## 2017-07-28 DIAGNOSIS — Z5321 Procedure and treatment not carried out due to patient leaving prior to being seen by health care provider: Secondary | ICD-10-CM | POA: Insufficient documentation

## 2017-07-28 DIAGNOSIS — R0789 Other chest pain: Secondary | ICD-10-CM | POA: Diagnosis present

## 2017-07-28 LAB — BASIC METABOLIC PANEL
Anion gap: 13 (ref 5–15)
BUN: 13 mg/dL (ref 6–20)
CO2: 24 mmol/L (ref 22–32)
Calcium: 9.2 mg/dL (ref 8.9–10.3)
Chloride: 94 mmol/L — ABNORMAL LOW (ref 101–111)
Creatinine, Ser: 0.82 mg/dL (ref 0.44–1.00)
GFR calc Af Amer: >60 mL/min (ref >60)
GFR calc non Af Amer: >60 mL/min (ref >60)
Glucose, Bld: 217 mg/dL — ABNORMAL HIGH (ref 65–99)
Potassium: 3.2 mmol/L — ABNORMAL LOW (ref 3.5–5.1)
Sodium: 131 mmol/L — ABNORMAL LOW (ref 135–145)

## 2017-07-28 LAB — CBC
HCT: 34.7 % — ABNORMAL LOW (ref 36.0–46.0)
Hemoglobin: 12.4 g/dL (ref 12.0–15.0)
MCH: 31.1 pg (ref 26.0–34.0)
MCHC: 35.7 g/dL (ref 30.0–36.0)
MCV: 87 fL (ref 78.0–100.0)
Platelets: 325 10*3/uL (ref 150–400)
RBC: 3.99 MIL/uL (ref 3.87–5.11)
RDW: 13.2 % (ref 11.5–15.5)
WBC: 18.6 10*3/uL — ABNORMAL HIGH (ref 4.0–10.5)

## 2017-07-28 LAB — POCT I-STAT TROPONIN I: Troponin i, poc: 0.01 ng/mL (ref 0.00–0.08)

## 2017-07-28 NOTE — ED Triage Notes (Signed)
Pt reports chest pain that started yesterday , felt better this morning after laying down yet recurred after walking to the mail box, she said. Reports had a n epidural yesterday for chronic pain. Also reported shortness of breath after walking .

## 2017-07-28 NOTE — ED Notes (Signed)
Pt informed registration she wanted to leave and asked to talk to a nurse regarding lab results. This RN informed pt that no results are able to be released to pt except by a doctor in treatment room. Pt stated she "still wanted to take her chances and would rather die at home that wait any longer to be seen"

## 2017-08-13 DIAGNOSIS — G4700 Insomnia, unspecified: Secondary | ICD-10-CM | POA: Diagnosis not present

## 2017-08-13 DIAGNOSIS — E039 Hypothyroidism, unspecified: Secondary | ICD-10-CM | POA: Diagnosis not present

## 2017-08-13 DIAGNOSIS — R5383 Other fatigue: Secondary | ICD-10-CM | POA: Diagnosis not present

## 2017-08-13 DIAGNOSIS — G4733 Obstructive sleep apnea (adult) (pediatric): Secondary | ICD-10-CM | POA: Diagnosis not present

## 2017-08-13 DIAGNOSIS — G603 Idiopathic progressive neuropathy: Secondary | ICD-10-CM | POA: Diagnosis not present

## 2017-08-13 DIAGNOSIS — G629 Polyneuropathy, unspecified: Secondary | ICD-10-CM | POA: Diagnosis not present

## 2017-08-17 DIAGNOSIS — M5440 Lumbago with sciatica, unspecified side: Secondary | ICD-10-CM | POA: Diagnosis not present

## 2017-08-17 DIAGNOSIS — Z6834 Body mass index (BMI) 34.0-34.9, adult: Secondary | ICD-10-CM | POA: Diagnosis not present

## 2017-08-17 DIAGNOSIS — I10 Essential (primary) hypertension: Secondary | ICD-10-CM | POA: Diagnosis not present

## 2017-08-17 DIAGNOSIS — M4317 Spondylolisthesis, lumbosacral region: Secondary | ICD-10-CM | POA: Diagnosis not present

## 2017-09-01 DIAGNOSIS — G4733 Obstructive sleep apnea (adult) (pediatric): Secondary | ICD-10-CM | POA: Diagnosis not present

## 2017-09-01 DIAGNOSIS — Z23 Encounter for immunization: Secondary | ICD-10-CM | POA: Diagnosis not present

## 2017-09-01 DIAGNOSIS — I10 Essential (primary) hypertension: Secondary | ICD-10-CM | POA: Diagnosis not present

## 2017-09-01 DIAGNOSIS — E7800 Pure hypercholesterolemia, unspecified: Secondary | ICD-10-CM | POA: Diagnosis not present

## 2017-09-01 DIAGNOSIS — K219 Gastro-esophageal reflux disease without esophagitis: Secondary | ICD-10-CM | POA: Diagnosis not present

## 2017-09-01 DIAGNOSIS — F419 Anxiety disorder, unspecified: Secondary | ICD-10-CM | POA: Diagnosis not present

## 2017-09-01 DIAGNOSIS — R5383 Other fatigue: Secondary | ICD-10-CM | POA: Diagnosis not present

## 2017-10-05 DIAGNOSIS — G4733 Obstructive sleep apnea (adult) (pediatric): Secondary | ICD-10-CM | POA: Diagnosis not present

## 2017-10-07 DIAGNOSIS — M545 Low back pain: Secondary | ICD-10-CM | POA: Diagnosis not present

## 2017-10-07 DIAGNOSIS — G4733 Obstructive sleep apnea (adult) (pediatric): Secondary | ICD-10-CM | POA: Diagnosis not present

## 2017-10-21 DIAGNOSIS — N3001 Acute cystitis with hematuria: Secondary | ICD-10-CM | POA: Diagnosis not present

## 2017-10-21 DIAGNOSIS — R300 Dysuria: Secondary | ICD-10-CM | POA: Diagnosis not present

## 2017-10-21 DIAGNOSIS — R197 Diarrhea, unspecified: Secondary | ICD-10-CM | POA: Diagnosis not present

## 2017-10-21 DIAGNOSIS — F419 Anxiety disorder, unspecified: Secondary | ICD-10-CM | POA: Diagnosis not present

## 2017-12-22 DIAGNOSIS — G4733 Obstructive sleep apnea (adult) (pediatric): Secondary | ICD-10-CM | POA: Diagnosis not present

## 2018-01-06 DIAGNOSIS — G4733 Obstructive sleep apnea (adult) (pediatric): Secondary | ICD-10-CM | POA: Diagnosis not present

## 2018-01-22 DIAGNOSIS — G4733 Obstructive sleep apnea (adult) (pediatric): Secondary | ICD-10-CM | POA: Diagnosis not present

## 2018-02-17 DIAGNOSIS — M9903 Segmental and somatic dysfunction of lumbar region: Secondary | ICD-10-CM | POA: Diagnosis not present

## 2018-02-17 DIAGNOSIS — M5136 Other intervertebral disc degeneration, lumbar region: Secondary | ICD-10-CM | POA: Diagnosis not present

## 2018-03-02 DIAGNOSIS — R69 Illness, unspecified: Secondary | ICD-10-CM | POA: Diagnosis not present

## 2018-03-08 DIAGNOSIS — M545 Low back pain: Secondary | ICD-10-CM | POA: Diagnosis not present

## 2018-03-08 DIAGNOSIS — K219 Gastro-esophageal reflux disease without esophagitis: Secondary | ICD-10-CM | POA: Diagnosis not present

## 2018-03-08 DIAGNOSIS — I10 Essential (primary) hypertension: Secondary | ICD-10-CM | POA: Diagnosis not present

## 2018-03-08 DIAGNOSIS — G629 Polyneuropathy, unspecified: Secondary | ICD-10-CM | POA: Diagnosis not present

## 2018-03-08 DIAGNOSIS — Z0000 Encounter for general adult medical examination without abnormal findings: Secondary | ICD-10-CM | POA: Diagnosis not present

## 2018-03-08 DIAGNOSIS — R69 Illness, unspecified: Secondary | ICD-10-CM | POA: Diagnosis not present

## 2018-03-08 DIAGNOSIS — E2839 Other primary ovarian failure: Secondary | ICD-10-CM | POA: Diagnosis not present

## 2018-03-08 DIAGNOSIS — E039 Hypothyroidism, unspecified: Secondary | ICD-10-CM | POA: Diagnosis not present

## 2018-03-08 DIAGNOSIS — G4700 Insomnia, unspecified: Secondary | ICD-10-CM | POA: Diagnosis not present

## 2018-03-11 DIAGNOSIS — M5136 Other intervertebral disc degeneration, lumbar region: Secondary | ICD-10-CM | POA: Diagnosis not present

## 2018-03-11 DIAGNOSIS — M9903 Segmental and somatic dysfunction of lumbar region: Secondary | ICD-10-CM | POA: Diagnosis not present

## 2018-03-17 DIAGNOSIS — R300 Dysuria: Secondary | ICD-10-CM | POA: Diagnosis not present

## 2018-03-17 DIAGNOSIS — N3000 Acute cystitis without hematuria: Secondary | ICD-10-CM | POA: Diagnosis not present

## 2018-03-24 DIAGNOSIS — E871 Hypo-osmolality and hyponatremia: Secondary | ICD-10-CM | POA: Diagnosis not present

## 2018-03-31 DIAGNOSIS — G629 Polyneuropathy, unspecified: Secondary | ICD-10-CM | POA: Diagnosis not present

## 2018-03-31 DIAGNOSIS — M4317 Spondylolisthesis, lumbosacral region: Secondary | ICD-10-CM | POA: Diagnosis not present

## 2018-04-05 DIAGNOSIS — M5136 Other intervertebral disc degeneration, lumbar region: Secondary | ICD-10-CM | POA: Diagnosis not present

## 2018-04-05 DIAGNOSIS — M9903 Segmental and somatic dysfunction of lumbar region: Secondary | ICD-10-CM | POA: Diagnosis not present

## 2018-04-28 DIAGNOSIS — M5136 Other intervertebral disc degeneration, lumbar region: Secondary | ICD-10-CM | POA: Diagnosis not present

## 2018-04-28 DIAGNOSIS — M9903 Segmental and somatic dysfunction of lumbar region: Secondary | ICD-10-CM | POA: Diagnosis not present

## 2018-05-04 DIAGNOSIS — E2839 Other primary ovarian failure: Secondary | ICD-10-CM | POA: Diagnosis not present

## 2018-05-12 DIAGNOSIS — M9903 Segmental and somatic dysfunction of lumbar region: Secondary | ICD-10-CM | POA: Diagnosis not present

## 2018-05-12 DIAGNOSIS — R69 Illness, unspecified: Secondary | ICD-10-CM | POA: Diagnosis not present

## 2018-05-12 DIAGNOSIS — M5440 Lumbago with sciatica, unspecified side: Secondary | ICD-10-CM | POA: Diagnosis not present

## 2018-05-12 DIAGNOSIS — M5136 Other intervertebral disc degeneration, lumbar region: Secondary | ICD-10-CM | POA: Diagnosis not present

## 2018-05-12 DIAGNOSIS — G629 Polyneuropathy, unspecified: Secondary | ICD-10-CM | POA: Diagnosis not present

## 2018-05-19 DIAGNOSIS — M9903 Segmental and somatic dysfunction of lumbar region: Secondary | ICD-10-CM | POA: Diagnosis not present

## 2018-05-19 DIAGNOSIS — M5136 Other intervertebral disc degeneration, lumbar region: Secondary | ICD-10-CM | POA: Diagnosis not present

## 2018-05-26 DIAGNOSIS — I10 Essential (primary) hypertension: Secondary | ICD-10-CM | POA: Diagnosis not present

## 2018-06-09 DIAGNOSIS — J209 Acute bronchitis, unspecified: Secondary | ICD-10-CM | POA: Diagnosis not present

## 2018-06-09 DIAGNOSIS — M9903 Segmental and somatic dysfunction of lumbar region: Secondary | ICD-10-CM | POA: Diagnosis not present

## 2018-06-09 DIAGNOSIS — M542 Cervicalgia: Secondary | ICD-10-CM | POA: Diagnosis not present

## 2018-06-09 DIAGNOSIS — M5136 Other intervertebral disc degeneration, lumbar region: Secondary | ICD-10-CM | POA: Diagnosis not present

## 2018-06-09 DIAGNOSIS — G4452 New daily persistent headache (NDPH): Secondary | ICD-10-CM | POA: Diagnosis not present

## 2018-06-16 DIAGNOSIS — M5136 Other intervertebral disc degeneration, lumbar region: Secondary | ICD-10-CM | POA: Diagnosis not present

## 2018-06-16 DIAGNOSIS — M9903 Segmental and somatic dysfunction of lumbar region: Secondary | ICD-10-CM | POA: Diagnosis not present

## 2018-06-18 DIAGNOSIS — Z1231 Encounter for screening mammogram for malignant neoplasm of breast: Secondary | ICD-10-CM | POA: Diagnosis not present

## 2018-06-21 DIAGNOSIS — H02831 Dermatochalasis of right upper eyelid: Secondary | ICD-10-CM | POA: Diagnosis not present

## 2018-06-21 DIAGNOSIS — H02834 Dermatochalasis of left upper eyelid: Secondary | ICD-10-CM | POA: Diagnosis not present

## 2018-06-21 DIAGNOSIS — Z961 Presence of intraocular lens: Secondary | ICD-10-CM | POA: Diagnosis not present

## 2018-06-23 DIAGNOSIS — M9903 Segmental and somatic dysfunction of lumbar region: Secondary | ICD-10-CM | POA: Diagnosis not present

## 2018-06-23 DIAGNOSIS — M5136 Other intervertebral disc degeneration, lumbar region: Secondary | ICD-10-CM | POA: Diagnosis not present

## 2018-06-28 DIAGNOSIS — L988 Other specified disorders of the skin and subcutaneous tissue: Secondary | ICD-10-CM | POA: Diagnosis not present

## 2018-06-28 DIAGNOSIS — D1801 Hemangioma of skin and subcutaneous tissue: Secondary | ICD-10-CM | POA: Diagnosis not present

## 2018-06-28 DIAGNOSIS — Z85828 Personal history of other malignant neoplasm of skin: Secondary | ICD-10-CM | POA: Diagnosis not present

## 2018-06-28 DIAGNOSIS — D225 Melanocytic nevi of trunk: Secondary | ICD-10-CM | POA: Diagnosis not present

## 2018-06-28 DIAGNOSIS — L814 Other melanin hyperpigmentation: Secondary | ICD-10-CM | POA: Diagnosis not present

## 2018-06-28 DIAGNOSIS — L728 Other follicular cysts of the skin and subcutaneous tissue: Secondary | ICD-10-CM | POA: Diagnosis not present

## 2018-06-28 DIAGNOSIS — L309 Dermatitis, unspecified: Secondary | ICD-10-CM | POA: Diagnosis not present

## 2018-06-30 DIAGNOSIS — M545 Low back pain: Secondary | ICD-10-CM | POA: Diagnosis not present

## 2018-06-30 DIAGNOSIS — G4733 Obstructive sleep apnea (adult) (pediatric): Secondary | ICD-10-CM | POA: Diagnosis not present

## 2018-07-07 DIAGNOSIS — M9903 Segmental and somatic dysfunction of lumbar region: Secondary | ICD-10-CM | POA: Diagnosis not present

## 2018-07-07 DIAGNOSIS — M5136 Other intervertebral disc degeneration, lumbar region: Secondary | ICD-10-CM | POA: Diagnosis not present

## 2018-07-12 DIAGNOSIS — H04123 Dry eye syndrome of bilateral lacrimal glands: Secondary | ICD-10-CM | POA: Diagnosis not present

## 2018-07-14 DIAGNOSIS — Z01419 Encounter for gynecological examination (general) (routine) without abnormal findings: Secondary | ICD-10-CM | POA: Diagnosis not present

## 2018-07-21 DIAGNOSIS — M9903 Segmental and somatic dysfunction of lumbar region: Secondary | ICD-10-CM | POA: Diagnosis not present

## 2018-07-21 DIAGNOSIS — M5136 Other intervertebral disc degeneration, lumbar region: Secondary | ICD-10-CM | POA: Diagnosis not present

## 2018-08-11 DIAGNOSIS — M9903 Segmental and somatic dysfunction of lumbar region: Secondary | ICD-10-CM | POA: Diagnosis not present

## 2018-08-11 DIAGNOSIS — M5440 Lumbago with sciatica, unspecified side: Secondary | ICD-10-CM | POA: Diagnosis not present

## 2018-08-11 DIAGNOSIS — G629 Polyneuropathy, unspecified: Secondary | ICD-10-CM | POA: Diagnosis not present

## 2018-08-11 DIAGNOSIS — M5136 Other intervertebral disc degeneration, lumbar region: Secondary | ICD-10-CM | POA: Diagnosis not present

## 2018-08-11 DIAGNOSIS — I10 Essential (primary) hypertension: Secondary | ICD-10-CM | POA: Diagnosis not present

## 2018-08-11 DIAGNOSIS — Z6833 Body mass index (BMI) 33.0-33.9, adult: Secondary | ICD-10-CM | POA: Diagnosis not present

## 2018-08-25 DIAGNOSIS — M9903 Segmental and somatic dysfunction of lumbar region: Secondary | ICD-10-CM | POA: Diagnosis not present

## 2018-08-25 DIAGNOSIS — M5136 Other intervertebral disc degeneration, lumbar region: Secondary | ICD-10-CM | POA: Diagnosis not present

## 2018-09-08 DIAGNOSIS — M5136 Other intervertebral disc degeneration, lumbar region: Secondary | ICD-10-CM | POA: Diagnosis not present

## 2018-09-08 DIAGNOSIS — M9903 Segmental and somatic dysfunction of lumbar region: Secondary | ICD-10-CM | POA: Diagnosis not present

## 2018-09-09 DIAGNOSIS — Z6834 Body mass index (BMI) 34.0-34.9, adult: Secondary | ICD-10-CM | POA: Diagnosis not present

## 2018-09-09 DIAGNOSIS — M4317 Spondylolisthesis, lumbosacral region: Secondary | ICD-10-CM | POA: Diagnosis not present

## 2018-09-09 DIAGNOSIS — M5440 Lumbago with sciatica, unspecified side: Secondary | ICD-10-CM | POA: Diagnosis not present

## 2018-09-09 DIAGNOSIS — I10 Essential (primary) hypertension: Secondary | ICD-10-CM | POA: Diagnosis not present

## 2018-09-17 ENCOUNTER — Other Ambulatory Visit: Payer: Self-pay | Admitting: Neurosurgery

## 2018-09-17 DIAGNOSIS — M5442 Lumbago with sciatica, left side: Secondary | ICD-10-CM

## 2018-09-17 DIAGNOSIS — M5441 Lumbago with sciatica, right side: Secondary | ICD-10-CM

## 2018-09-22 DIAGNOSIS — M9903 Segmental and somatic dysfunction of lumbar region: Secondary | ICD-10-CM | POA: Diagnosis not present

## 2018-09-22 DIAGNOSIS — M5136 Other intervertebral disc degeneration, lumbar region: Secondary | ICD-10-CM | POA: Diagnosis not present

## 2018-09-23 DIAGNOSIS — R69 Illness, unspecified: Secondary | ICD-10-CM | POA: Diagnosis not present

## 2018-09-27 DIAGNOSIS — E2839 Other primary ovarian failure: Secondary | ICD-10-CM | POA: Diagnosis not present

## 2018-09-27 DIAGNOSIS — G629 Polyneuropathy, unspecified: Secondary | ICD-10-CM | POA: Diagnosis not present

## 2018-09-27 DIAGNOSIS — M545 Low back pain: Secondary | ICD-10-CM | POA: Diagnosis not present

## 2018-09-27 DIAGNOSIS — K219 Gastro-esophageal reflux disease without esophagitis: Secondary | ICD-10-CM | POA: Diagnosis not present

## 2018-09-27 DIAGNOSIS — G4700 Insomnia, unspecified: Secondary | ICD-10-CM | POA: Diagnosis not present

## 2018-09-27 DIAGNOSIS — E039 Hypothyroidism, unspecified: Secondary | ICD-10-CM | POA: Diagnosis not present

## 2018-09-27 DIAGNOSIS — J45909 Unspecified asthma, uncomplicated: Secondary | ICD-10-CM | POA: Diagnosis not present

## 2018-09-27 DIAGNOSIS — R69 Illness, unspecified: Secondary | ICD-10-CM | POA: Diagnosis not present

## 2018-09-27 DIAGNOSIS — Z6834 Body mass index (BMI) 34.0-34.9, adult: Secondary | ICD-10-CM | POA: Diagnosis not present

## 2018-09-27 DIAGNOSIS — I10 Essential (primary) hypertension: Secondary | ICD-10-CM | POA: Diagnosis not present

## 2018-09-28 DIAGNOSIS — E039 Hypothyroidism, unspecified: Secondary | ICD-10-CM | POA: Diagnosis not present

## 2018-09-28 DIAGNOSIS — I10 Essential (primary) hypertension: Secondary | ICD-10-CM | POA: Diagnosis not present

## 2018-09-30 ENCOUNTER — Other Ambulatory Visit: Payer: PPO

## 2018-10-03 IMAGING — CR DG CHEST 2V
2 series · 2 of 2 positions shown · non-contrast
Comparison: 04/10/2016

CLINICAL DATA: Central chest pain

EXAM:
CHEST  2 VIEW

[w chest pa]
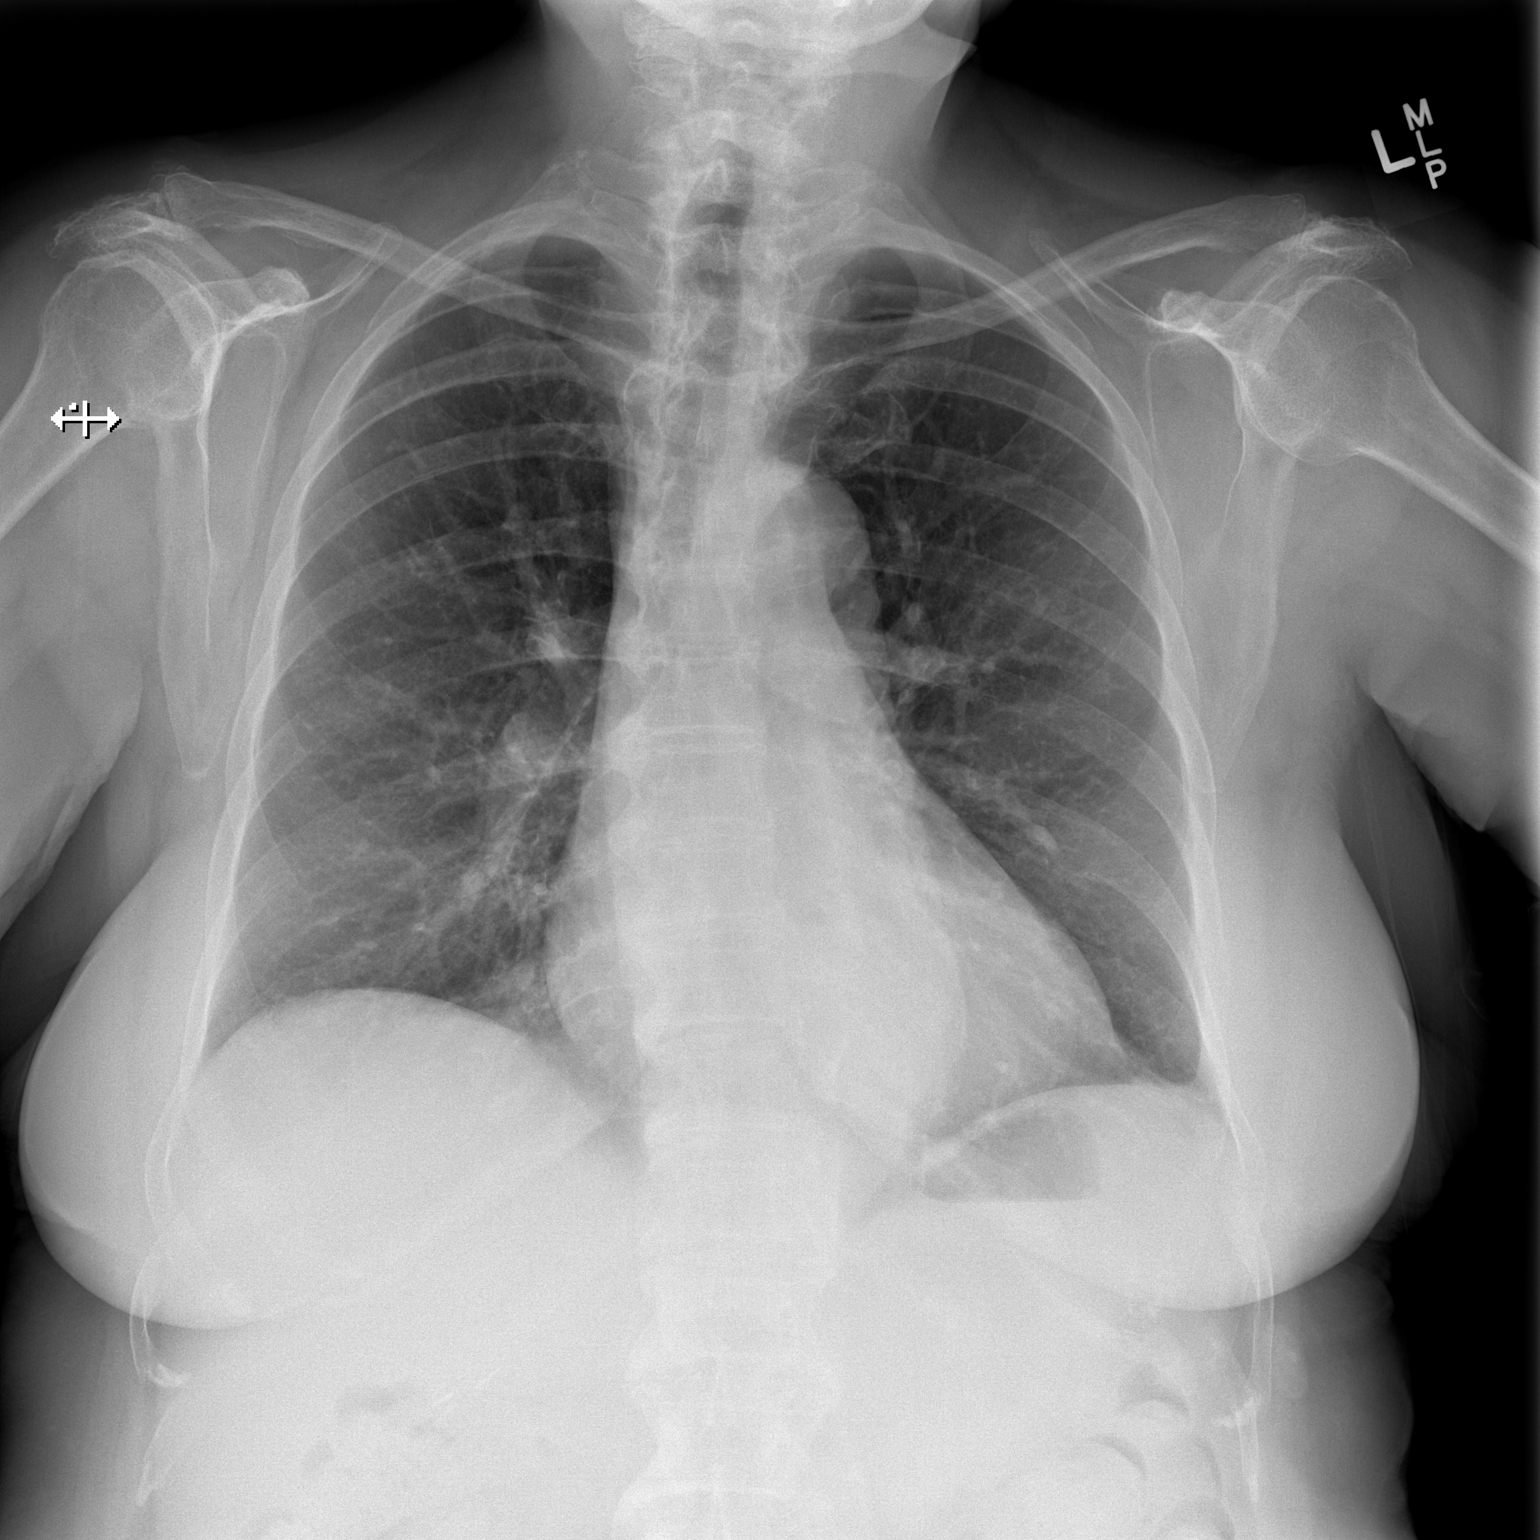

[w chest lat]
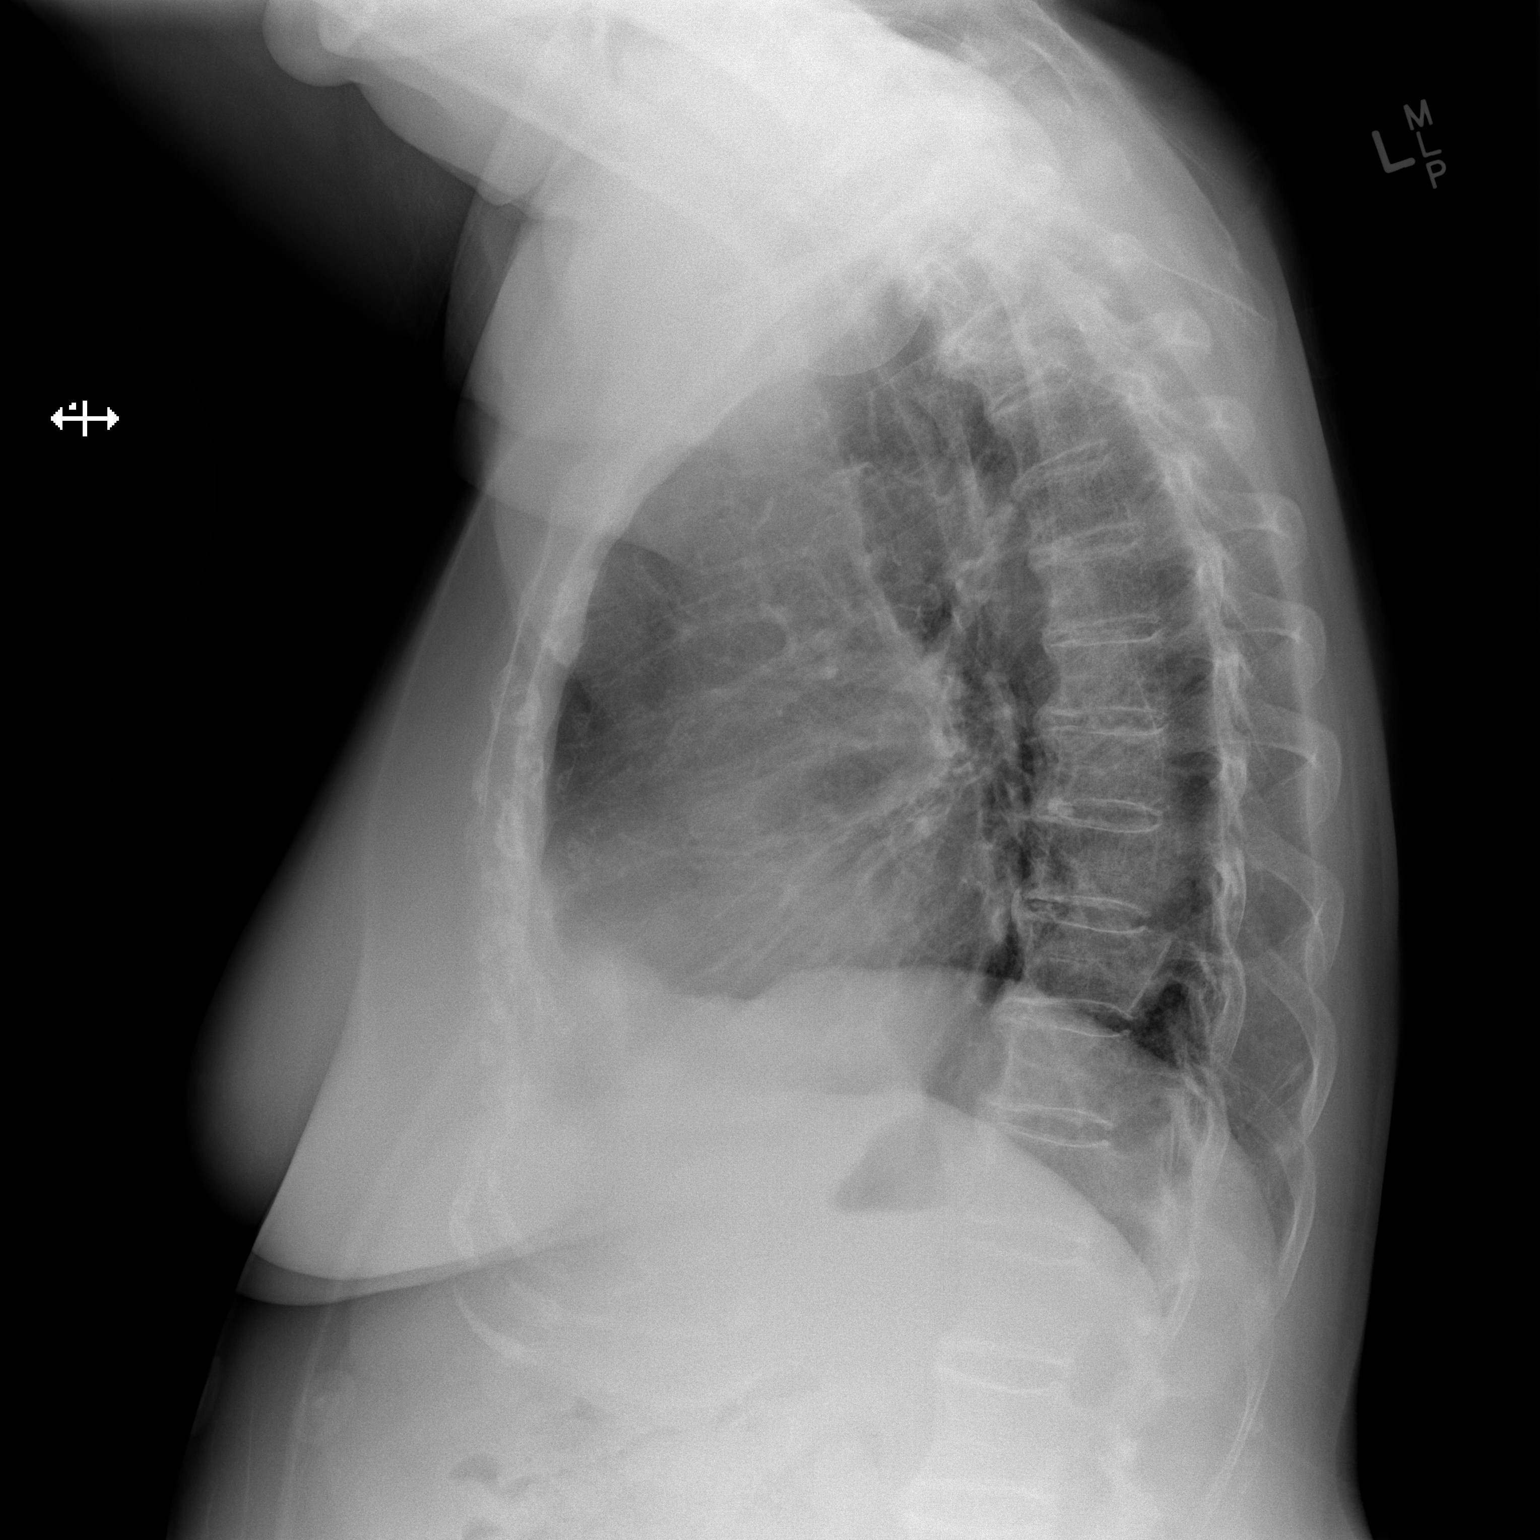

[2 of 2 positions shown; findings below may reference images not displayed]

FINDINGS: Heart and mediastinal contours are within normal limits. No focal
opacities or effusions. No acute bony abnormality.
IMPRESSION: No active cardiopulmonary disease.

## 2018-10-06 DIAGNOSIS — M5136 Other intervertebral disc degeneration, lumbar region: Secondary | ICD-10-CM | POA: Diagnosis not present

## 2018-10-06 DIAGNOSIS — M9903 Segmental and somatic dysfunction of lumbar region: Secondary | ICD-10-CM | POA: Diagnosis not present

## 2018-10-18 DIAGNOSIS — E871 Hypo-osmolality and hyponatremia: Secondary | ICD-10-CM | POA: Diagnosis not present

## 2018-10-20 DIAGNOSIS — M9903 Segmental and somatic dysfunction of lumbar region: Secondary | ICD-10-CM | POA: Diagnosis not present

## 2018-10-20 DIAGNOSIS — M5136 Other intervertebral disc degeneration, lumbar region: Secondary | ICD-10-CM | POA: Diagnosis not present

## 2018-11-15 DIAGNOSIS — G4733 Obstructive sleep apnea (adult) (pediatric): Secondary | ICD-10-CM | POA: Diagnosis not present

## 2018-11-17 DIAGNOSIS — M9903 Segmental and somatic dysfunction of lumbar region: Secondary | ICD-10-CM | POA: Diagnosis not present

## 2018-11-17 DIAGNOSIS — M5136 Other intervertebral disc degeneration, lumbar region: Secondary | ICD-10-CM | POA: Diagnosis not present

## 2018-12-06 DIAGNOSIS — M5416 Radiculopathy, lumbar region: Secondary | ICD-10-CM | POA: Diagnosis not present

## 2018-12-06 DIAGNOSIS — M4316 Spondylolisthesis, lumbar region: Secondary | ICD-10-CM | POA: Diagnosis not present

## 2018-12-08 DIAGNOSIS — E871 Hypo-osmolality and hyponatremia: Secondary | ICD-10-CM | POA: Diagnosis not present

## 2019-01-04 DIAGNOSIS — M4316 Spondylolisthesis, lumbar region: Secondary | ICD-10-CM | POA: Diagnosis not present

## 2019-01-04 DIAGNOSIS — M5416 Radiculopathy, lumbar region: Secondary | ICD-10-CM | POA: Diagnosis not present

## 2019-01-11 DIAGNOSIS — G4733 Obstructive sleep apnea (adult) (pediatric): Secondary | ICD-10-CM | POA: Diagnosis not present

## 2019-01-18 DIAGNOSIS — M5416 Radiculopathy, lumbar region: Secondary | ICD-10-CM | POA: Diagnosis not present

## 2019-02-01 DIAGNOSIS — Z0000 Encounter for general adult medical examination without abnormal findings: Secondary | ICD-10-CM | POA: Diagnosis not present

## 2019-02-01 DIAGNOSIS — G4700 Insomnia, unspecified: Secondary | ICD-10-CM | POA: Diagnosis not present

## 2019-02-01 DIAGNOSIS — K219 Gastro-esophageal reflux disease without esophagitis: Secondary | ICD-10-CM | POA: Diagnosis not present

## 2019-02-01 DIAGNOSIS — G629 Polyneuropathy, unspecified: Secondary | ICD-10-CM | POA: Diagnosis not present

## 2019-02-01 DIAGNOSIS — E039 Hypothyroidism, unspecified: Secondary | ICD-10-CM | POA: Diagnosis not present

## 2019-02-01 DIAGNOSIS — F419 Anxiety disorder, unspecified: Secondary | ICD-10-CM | POA: Diagnosis not present

## 2019-02-01 DIAGNOSIS — E2839 Other primary ovarian failure: Secondary | ICD-10-CM | POA: Diagnosis not present

## 2019-02-01 DIAGNOSIS — N390 Urinary tract infection, site not specified: Secondary | ICD-10-CM | POA: Diagnosis not present

## 2019-02-01 DIAGNOSIS — R7301 Impaired fasting glucose: Secondary | ICD-10-CM | POA: Diagnosis not present

## 2019-02-01 DIAGNOSIS — Z1389 Encounter for screening for other disorder: Secondary | ICD-10-CM | POA: Diagnosis not present

## 2019-02-01 DIAGNOSIS — I10 Essential (primary) hypertension: Secondary | ICD-10-CM | POA: Diagnosis not present

## 2019-02-01 DIAGNOSIS — J45909 Unspecified asthma, uncomplicated: Secondary | ICD-10-CM | POA: Diagnosis not present

## 2019-02-01 DIAGNOSIS — M545 Low back pain: Secondary | ICD-10-CM | POA: Diagnosis not present

## 2019-02-08 DIAGNOSIS — M719 Bursopathy, unspecified: Secondary | ICD-10-CM | POA: Diagnosis not present

## 2019-02-22 DIAGNOSIS — N3000 Acute cystitis without hematuria: Secondary | ICD-10-CM | POA: Diagnosis not present

## 2019-03-09 DIAGNOSIS — G4733 Obstructive sleep apnea (adult) (pediatric): Secondary | ICD-10-CM | POA: Diagnosis not present

## 2019-06-09 DIAGNOSIS — G894 Chronic pain syndrome: Secondary | ICD-10-CM | POA: Diagnosis not present

## 2019-06-20 DIAGNOSIS — Z780 Asymptomatic menopausal state: Secondary | ICD-10-CM | POA: Diagnosis not present

## 2019-06-20 DIAGNOSIS — Z1231 Encounter for screening mammogram for malignant neoplasm of breast: Secondary | ICD-10-CM | POA: Diagnosis not present

## 2019-07-05 DIAGNOSIS — M5416 Radiculopathy, lumbar region: Secondary | ICD-10-CM | POA: Diagnosis not present

## 2019-07-05 DIAGNOSIS — M4316 Spondylolisthesis, lumbar region: Secondary | ICD-10-CM | POA: Diagnosis not present

## 2019-08-04 DIAGNOSIS — G4733 Obstructive sleep apnea (adult) (pediatric): Secondary | ICD-10-CM | POA: Diagnosis not present

## 2019-08-15 DIAGNOSIS — E039 Hypothyroidism, unspecified: Secondary | ICD-10-CM | POA: Diagnosis not present

## 2019-08-15 DIAGNOSIS — R7303 Prediabetes: Secondary | ICD-10-CM | POA: Diagnosis not present

## 2019-08-15 DIAGNOSIS — Z23 Encounter for immunization: Secondary | ICD-10-CM | POA: Diagnosis not present

## 2019-08-15 DIAGNOSIS — G629 Polyneuropathy, unspecified: Secondary | ICD-10-CM | POA: Diagnosis not present

## 2019-08-15 DIAGNOSIS — G4700 Insomnia, unspecified: Secondary | ICD-10-CM | POA: Diagnosis not present

## 2019-08-15 DIAGNOSIS — L299 Pruritus, unspecified: Secondary | ICD-10-CM | POA: Diagnosis not present

## 2019-08-15 DIAGNOSIS — J45909 Unspecified asthma, uncomplicated: Secondary | ICD-10-CM | POA: Diagnosis not present

## 2019-08-15 DIAGNOSIS — M545 Low back pain: Secondary | ICD-10-CM | POA: Diagnosis not present

## 2019-08-15 DIAGNOSIS — I10 Essential (primary) hypertension: Secondary | ICD-10-CM | POA: Diagnosis not present

## 2019-08-15 DIAGNOSIS — K219 Gastro-esophageal reflux disease without esophagitis: Secondary | ICD-10-CM | POA: Diagnosis not present

## 2019-08-15 DIAGNOSIS — F419 Anxiety disorder, unspecified: Secondary | ICD-10-CM | POA: Diagnosis not present

## 2019-09-05 DIAGNOSIS — R7303 Prediabetes: Secondary | ICD-10-CM | POA: Diagnosis not present

## 2019-09-05 DIAGNOSIS — E871 Hypo-osmolality and hyponatremia: Secondary | ICD-10-CM | POA: Diagnosis not present

## 2019-09-09 DIAGNOSIS — H26492 Other secondary cataract, left eye: Secondary | ICD-10-CM | POA: Diagnosis not present

## 2019-09-09 DIAGNOSIS — Z961 Presence of intraocular lens: Secondary | ICD-10-CM | POA: Diagnosis not present

## 2019-09-20 DIAGNOSIS — G4733 Obstructive sleep apnea (adult) (pediatric): Secondary | ICD-10-CM | POA: Diagnosis not present

## 2019-09-28 DIAGNOSIS — E871 Hypo-osmolality and hyponatremia: Secondary | ICD-10-CM | POA: Diagnosis not present

## 2019-11-01 DIAGNOSIS — E871 Hypo-osmolality and hyponatremia: Secondary | ICD-10-CM | POA: Diagnosis not present

## 2019-11-08 DIAGNOSIS — L853 Xerosis cutis: Secondary | ICD-10-CM | POA: Diagnosis not present

## 2019-11-08 DIAGNOSIS — L821 Other seborrheic keratosis: Secondary | ICD-10-CM | POA: Diagnosis not present

## 2019-11-08 DIAGNOSIS — L7211 Pilar cyst: Secondary | ICD-10-CM | POA: Diagnosis not present

## 2019-11-08 DIAGNOSIS — D229 Melanocytic nevi, unspecified: Secondary | ICD-10-CM | POA: Diagnosis not present

## 2019-11-22 DIAGNOSIS — L853 Xerosis cutis: Secondary | ICD-10-CM | POA: Diagnosis not present

## 2019-11-22 DIAGNOSIS — L218 Other seborrheic dermatitis: Secondary | ICD-10-CM | POA: Diagnosis not present

## 2019-11-22 DIAGNOSIS — B0089 Other herpesviral infection: Secondary | ICD-10-CM | POA: Diagnosis not present

## 2019-12-14 DIAGNOSIS — E039 Hypothyroidism, unspecified: Secondary | ICD-10-CM | POA: Diagnosis not present

## 2019-12-14 DIAGNOSIS — I10 Essential (primary) hypertension: Secondary | ICD-10-CM | POA: Diagnosis not present

## 2019-12-14 DIAGNOSIS — F329 Major depressive disorder, single episode, unspecified: Secondary | ICD-10-CM | POA: Diagnosis not present

## 2019-12-14 DIAGNOSIS — J45909 Unspecified asthma, uncomplicated: Secondary | ICD-10-CM | POA: Diagnosis not present

## 2019-12-14 DIAGNOSIS — E7800 Pure hypercholesterolemia, unspecified: Secondary | ICD-10-CM | POA: Diagnosis not present

## 2019-12-14 DIAGNOSIS — F324 Major depressive disorder, single episode, in partial remission: Secondary | ICD-10-CM | POA: Diagnosis not present

## 2019-12-23 ENCOUNTER — Ambulatory Visit: Payer: PPO | Attending: Internal Medicine

## 2019-12-23 DIAGNOSIS — Z23 Encounter for immunization: Secondary | ICD-10-CM

## 2019-12-23 NOTE — Progress Notes (Signed)
   Covid-19 Vaccination Clinic  Name:  HADASHA ARTLEY    MRN: [id number] DOB: [birth year]  12/23/2019  Ms. Pelaez was observed post Covid-19 immunization for 15 minutes without incidence. She was provided with Vaccine Information Sheet and instruction to access the V-Safe system.   Ms. Delles was instructed to call 911 with any severe reactions post vaccine: Marland Kitchen Difficulty breathing  . Swelling of your face and throat  . A fast heartbeat  . A bad rash all over your body  . Dizziness and weakness    Immunizations Administered    Name Date Dose VIS Date Route   Pfizer COVID-19 Vaccine 12/23/2019  8:50 AM 0.3 mL 11/11/2019 Intramuscular   Manufacturer: [employer]   Lot: [id number]   [place]: [id number]

## 2020-01-03 DIAGNOSIS — E7800 Pure hypercholesterolemia, unspecified: Secondary | ICD-10-CM | POA: Diagnosis not present

## 2020-01-03 DIAGNOSIS — J45909 Unspecified asthma, uncomplicated: Secondary | ICD-10-CM | POA: Diagnosis not present

## 2020-01-03 DIAGNOSIS — I10 Essential (primary) hypertension: Secondary | ICD-10-CM | POA: Diagnosis not present

## 2020-01-03 DIAGNOSIS — E039 Hypothyroidism, unspecified: Secondary | ICD-10-CM | POA: Diagnosis not present

## 2020-01-03 DIAGNOSIS — F324 Major depressive disorder, single episode, in partial remission: Secondary | ICD-10-CM | POA: Diagnosis not present

## 2020-01-03 DIAGNOSIS — F329 Major depressive disorder, single episode, unspecified: Secondary | ICD-10-CM | POA: Diagnosis not present

## 2020-01-13 ENCOUNTER — Ambulatory Visit: Payer: PPO | Attending: Internal Medicine

## 2020-01-13 DIAGNOSIS — J45909 Unspecified asthma, uncomplicated: Secondary | ICD-10-CM | POA: Diagnosis not present

## 2020-01-13 DIAGNOSIS — E871 Hypo-osmolality and hyponatremia: Secondary | ICD-10-CM | POA: Diagnosis not present

## 2020-01-13 DIAGNOSIS — I10 Essential (primary) hypertension: Secondary | ICD-10-CM | POA: Diagnosis not present

## 2020-01-13 DIAGNOSIS — Z23 Encounter for immunization: Secondary | ICD-10-CM

## 2020-01-13 NOTE — Progress Notes (Signed)
   Covid-19 Vaccination Clinic  Name:  Tabitha Murphy    MRN: [id number] DOB: Apr 10, 1941  01/13/2020  [name] was observed post Covid-19 immunization for 15 minutes without incidence. She was provided with Vaccine Information Sheet and instruction to access the V-Safe system.   [name] was instructed to call 911 with any severe reactions post vaccine: Marland Kitchen Difficulty breathing  . Swelling of your face and throat  . A fast heartbeat  . A bad rash all over your body  . Dizziness and weakness    Immunizations Administered    Name Date Dose VIS Date Route   Pfizer COVID-19 Vaccine 01/13/2020  8:25 AM 0.3 mL 11/11/2019 Intramuscular   Manufacturer: [place]   Lot: [id number]   [place]: [id number]

## 2020-01-17 DIAGNOSIS — G4733 Obstructive sleep apnea (adult) (pediatric): Secondary | ICD-10-CM | POA: Diagnosis not present

## 2020-01-31 DIAGNOSIS — G4700 Insomnia, unspecified: Secondary | ICD-10-CM | POA: Diagnosis not present

## 2020-01-31 DIAGNOSIS — J45909 Unspecified asthma, uncomplicated: Secondary | ICD-10-CM | POA: Diagnosis not present

## 2020-01-31 DIAGNOSIS — F329 Major depressive disorder, single episode, unspecified: Secondary | ICD-10-CM | POA: Diagnosis not present

## 2020-01-31 DIAGNOSIS — F324 Major depressive disorder, single episode, in partial remission: Secondary | ICD-10-CM | POA: Diagnosis not present

## 2020-01-31 DIAGNOSIS — E7800 Pure hypercholesterolemia, unspecified: Secondary | ICD-10-CM | POA: Diagnosis not present

## 2020-01-31 DIAGNOSIS — E039 Hypothyroidism, unspecified: Secondary | ICD-10-CM | POA: Diagnosis not present

## 2020-01-31 DIAGNOSIS — I10 Essential (primary) hypertension: Secondary | ICD-10-CM | POA: Diagnosis not present

## 2020-02-13 DIAGNOSIS — R7309 Other abnormal glucose: Secondary | ICD-10-CM | POA: Diagnosis not present

## 2020-02-13 DIAGNOSIS — G4700 Insomnia, unspecified: Secondary | ICD-10-CM | POA: Diagnosis not present

## 2020-02-13 DIAGNOSIS — I10 Essential (primary) hypertension: Secondary | ICD-10-CM | POA: Diagnosis not present

## 2020-02-13 DIAGNOSIS — F419 Anxiety disorder, unspecified: Secondary | ICD-10-CM | POA: Diagnosis not present

## 2020-02-13 DIAGNOSIS — J45909 Unspecified asthma, uncomplicated: Secondary | ICD-10-CM | POA: Diagnosis not present

## 2020-02-13 DIAGNOSIS — L299 Pruritus, unspecified: Secondary | ICD-10-CM | POA: Diagnosis not present

## 2020-02-13 DIAGNOSIS — K219 Gastro-esophageal reflux disease without esophagitis: Secondary | ICD-10-CM | POA: Diagnosis not present

## 2020-02-13 DIAGNOSIS — G629 Polyneuropathy, unspecified: Secondary | ICD-10-CM | POA: Diagnosis not present

## 2020-02-13 DIAGNOSIS — Z0000 Encounter for general adult medical examination without abnormal findings: Secondary | ICD-10-CM | POA: Diagnosis not present

## 2020-02-13 DIAGNOSIS — E7800 Pure hypercholesterolemia, unspecified: Secondary | ICD-10-CM | POA: Diagnosis not present

## 2020-02-13 DIAGNOSIS — E039 Hypothyroidism, unspecified: Secondary | ICD-10-CM | POA: Diagnosis not present

## 2020-03-10 DIAGNOSIS — M5416 Radiculopathy, lumbar region: Secondary | ICD-10-CM | POA: Diagnosis not present

## 2020-03-20 DIAGNOSIS — E7800 Pure hypercholesterolemia, unspecified: Secondary | ICD-10-CM | POA: Diagnosis not present

## 2020-03-20 DIAGNOSIS — J45909 Unspecified asthma, uncomplicated: Secondary | ICD-10-CM | POA: Diagnosis not present

## 2020-03-20 DIAGNOSIS — F324 Major depressive disorder, single episode, in partial remission: Secondary | ICD-10-CM | POA: Diagnosis not present

## 2020-03-20 DIAGNOSIS — I10 Essential (primary) hypertension: Secondary | ICD-10-CM | POA: Diagnosis not present

## 2020-03-20 DIAGNOSIS — G4700 Insomnia, unspecified: Secondary | ICD-10-CM | POA: Diagnosis not present

## 2020-03-20 DIAGNOSIS — E039 Hypothyroidism, unspecified: Secondary | ICD-10-CM | POA: Diagnosis not present

## 2020-03-20 DIAGNOSIS — F329 Major depressive disorder, single episode, unspecified: Secondary | ICD-10-CM | POA: Diagnosis not present

## 2020-03-21 ENCOUNTER — Encounter: Payer: Self-pay | Admitting: Neurology

## 2020-04-09 ENCOUNTER — Telehealth: Payer: Self-pay | Admitting: Neurology

## 2020-04-09 ENCOUNTER — Encounter: Payer: Self-pay | Admitting: Neurology

## 2020-04-09 ENCOUNTER — Ambulatory Visit: Payer: PPO | Admitting: Neurology

## 2020-04-09 ENCOUNTER — Other Ambulatory Visit: Payer: Self-pay

## 2020-04-09 VITALS — BP 147/75 | HR 58 | Ht 61.5 in | Wt 177.0 lb

## 2020-04-09 DIAGNOSIS — M542 Cervicalgia: Secondary | ICD-10-CM | POA: Diagnosis not present

## 2020-04-09 DIAGNOSIS — G8929 Other chronic pain: Secondary | ICD-10-CM

## 2020-04-09 DIAGNOSIS — M5440 Lumbago with sciatica, unspecified side: Secondary | ICD-10-CM

## 2020-04-09 DIAGNOSIS — R202 Paresthesia of skin: Secondary | ICD-10-CM | POA: Diagnosis not present

## 2020-04-09 DIAGNOSIS — R269 Unspecified abnormalities of gait and mobility: Secondary | ICD-10-CM

## 2020-04-09 MED ORDER — DULOXETINE HCL 30 MG PO CPEP: 30.0000 mg | ORAL_CAPSULE | Freq: Every day | ORAL | 11 refills | Status: DC

## 2020-04-09 NOTE — Telephone Encounter (Signed)
Health team order sent to GI. No auth they will reach out to the patient to schedule.  
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
## 2020-04-09 NOTE — Telephone Encounter (Signed)
I called pt. She reports that she spoke with her friend regarding the EMG/NCV and her friend reported that it was the most painful test she has ever had. Pt is nervous about the test. I advised her that the EMG/NCV is uncomfortable but is generally well tolerated by our patients and we do not pre-medicate before the test. Pt decided to still pursue testing as scheduled.

## 2020-04-09 NOTE — Telephone Encounter (Signed)
Pt LVM requesting a CB from RN to discuss  EMG

## 2020-04-10 NOTE — Telephone Encounter (Signed)
Pt called stating that she is needing an open MRI. Pt is extremely claustrophobic and is refusing to do anything but open. Please advise.

## 2020-04-10 NOTE — Telephone Encounter (Signed)
Noted, order faxed to [facility] they will reach out to the patient to schedule.

## 2020-04-11 LAB — MULTIPLE MYELOMA PANEL, SERUM
Albumin SerPl Elph-Mcnc: 3.9 g/dL (ref 2.9–4.4)
Albumin/Glob SerPl: 1.2 (ref 0.7–1.7)
Alpha 1: 0.3 g/dL (ref 0.0–0.4)
Alpha2 Glob SerPl Elph-Mcnc: 1.1 g/dL — ABNORMAL HIGH (ref 0.4–1.0)
B-Globulin SerPl Elph-Mcnc: 1.3 g/dL (ref 0.7–1.3)
Gamma Glob SerPl Elph-Mcnc: 0.9 g/dL (ref 0.4–1.8)
Globulin, Total: 3.5 g/dL (ref 2.2–3.9)
IgA/Immunoglobulin A, Serum: 271 mg/dL (ref 64–422)
IgG (Immunoglobin G), Serum: 872 mg/dL (ref 586–1602)
IgM (Immunoglobulin M), Srm: 108 mg/dL (ref 26–217)
Total Protein: 7.4 g/dL (ref 6.0–8.5)

## 2020-04-11 LAB — ANA W/REFLEX IF POSITIVE: Anti Nuclear Antibody (ANA): NEGATIVE

## 2020-04-11 LAB — RPR: RPR Ser Ql: NONREACTIVE

## 2020-04-11 LAB — VITAMIN B12: Vitamin B-12: 413 pg/mL (ref 232–1245)

## 2020-04-11 LAB — C-REACTIVE PROTEIN: CRP: 6 mg/L (ref 0–10)

## 2020-04-11 LAB — HGB A1C W/O EAG: Hgb A1c MFr Bld: 6 % — ABNORMAL HIGH (ref 4.8–5.6)

## 2020-04-11 LAB — RHEUMATOID FACTOR: Rheumatoid fact SerPl-aCnc: <10 IU/mL (ref 0.0–13.9)

## 2020-04-11 LAB — CK: Total CK: 52 U/L (ref 32–182)

## 2020-04-11 LAB — SEDIMENTATION RATE: Sed Rate: 18 mm/hr (ref 0–40)

## 2020-04-11 NOTE — Telephone Encounter (Signed)
Scheduled for 04/19/20 at [facility].

## 2020-04-17 ENCOUNTER — Telehealth: Payer: Self-pay | Admitting: Neurology

## 2020-04-17 NOTE — Telephone Encounter (Signed)
Patient LVM stating she has taken the duloxetine for 3 days - 1 pill each day and experienced  Diarrhea and was also unable to sleep. Pt states she will no longer be taking and would like a CB from RN to discuss other options

## 2020-04-18 NOTE — Addendum Note (Signed)
Addended by: Noberto Retort C on: 04/18/2020 05:41 PM   Modules accepted: Orders

## 2020-04-18 NOTE — Telephone Encounter (Addendum)
I have attempted to call the patient back twice today. No answer or machine. I called her son (on [place]) who will reach out to her and have her get in touch with me.

## 2020-04-18 NOTE — Telephone Encounter (Signed)
She has pain throughout the day and night. She would like to proceed with the NCV/EMG then decide on further treatment.

## 2020-04-18 NOTE — Telephone Encounter (Signed)
Please call patient, chart reviewed, she has been dealing with this chronic low back pain, lower extremity pain for many years  Please ask her what time does she feel most pain, if it is nighttime, may consider low-dose nortriptyline 10 mg titrating to 20 mg every night  If she is not happy with the suggestion, I will see her again on June 6, may talk about medication choice then

## 2020-04-19 DIAGNOSIS — M40202 Unspecified kyphosis, cervical region: Secondary | ICD-10-CM | POA: Diagnosis not present

## 2020-04-19 DIAGNOSIS — M50222 Other cervical disc displacement at C5-C6 level: Secondary | ICD-10-CM | POA: Diagnosis not present

## 2020-04-19 DIAGNOSIS — M47812 Spondylosis without myelopathy or radiculopathy, cervical region: Secondary | ICD-10-CM | POA: Diagnosis not present

## 2020-04-26 ENCOUNTER — Telehealth: Payer: Self-pay | Admitting: Neurology

## 2020-04-26 NOTE — Telephone Encounter (Signed)
I have spoken to the patient and she is aware of the results below. She would like a copy of the results mailed to her home address. This will be done today. Also, a copy has been faxed and confirmed to her PCP office at [phone].

## 2020-04-26 NOTE — Telephone Encounter (Signed)
  IMPRESSION: 1. Reversal of the normal cervical lordosis. 2. Multilevel DDD and facet arthrosis. Mild spinal canal stenosis and perhaps minimal impingement of the spinal cord suggested at C5-6 and C6-7. No frank cord compression. Moderate bilateral foraminal stenosis suggested C3-4. 3. No acute fracture. 4. No intrinsic spinal cord abnormality. 5. There is a 2.9 cm left-sided thyroid nodule which is nonspecific.   Please print out MRI cervical report from [place], fax the results to her primary care physician Dr. Mancel Bale rates, she had a left side thyroid nodule 2.9 cm, she should follow-up with him for further evaluation  MRI of the cervical spine showed reversal of normal  cervical spondylosis, multilevel degenerative changes, but no significant canal foraminal narrowing.

## 2020-05-01 DIAGNOSIS — H5789 Other specified disorders of eye and adnexa: Secondary | ICD-10-CM | POA: Diagnosis not present

## 2020-05-01 DIAGNOSIS — Z961 Presence of intraocular lens: Secondary | ICD-10-CM | POA: Diagnosis not present

## 2020-05-01 DIAGNOSIS — H1131 Conjunctival hemorrhage, right eye: Secondary | ICD-10-CM | POA: Diagnosis not present

## 2020-05-01 DIAGNOSIS — H579 Unspecified disorder of eye and adnexa: Secondary | ICD-10-CM | POA: Diagnosis not present

## 2020-05-01 DIAGNOSIS — H26492 Other secondary cataract, left eye: Secondary | ICD-10-CM | POA: Diagnosis not present

## 2020-05-09 ENCOUNTER — Ambulatory Visit: Payer: PPO | Admitting: Neurology

## 2020-05-09 ENCOUNTER — Ambulatory Visit (INDEPENDENT_AMBULATORY_CARE_PROVIDER_SITE_OTHER): Payer: PPO | Admitting: Neurology

## 2020-05-09 ENCOUNTER — Other Ambulatory Visit: Payer: Self-pay | Admitting: *Deleted

## 2020-05-09 DIAGNOSIS — G8929 Other chronic pain: Secondary | ICD-10-CM

## 2020-05-09 DIAGNOSIS — I10 Essential (primary) hypertension: Secondary | ICD-10-CM | POA: Diagnosis not present

## 2020-05-09 DIAGNOSIS — F324 Major depressive disorder, single episode, in partial remission: Secondary | ICD-10-CM | POA: Diagnosis not present

## 2020-05-09 DIAGNOSIS — E039 Hypothyroidism, unspecified: Secondary | ICD-10-CM | POA: Diagnosis not present

## 2020-05-09 DIAGNOSIS — M542 Cervicalgia: Secondary | ICD-10-CM | POA: Diagnosis not present

## 2020-05-09 DIAGNOSIS — F329 Major depressive disorder, single episode, unspecified: Secondary | ICD-10-CM | POA: Diagnosis not present

## 2020-05-09 DIAGNOSIS — R202 Paresthesia of skin: Secondary | ICD-10-CM

## 2020-05-09 DIAGNOSIS — R269 Unspecified abnormalities of gait and mobility: Secondary | ICD-10-CM

## 2020-05-09 DIAGNOSIS — E7800 Pure hypercholesterolemia, unspecified: Secondary | ICD-10-CM | POA: Diagnosis not present

## 2020-05-09 DIAGNOSIS — J45909 Unspecified asthma, uncomplicated: Secondary | ICD-10-CM | POA: Diagnosis not present

## 2020-05-09 DIAGNOSIS — M5440 Lumbago with sciatica, unspecified side: Secondary | ICD-10-CM

## 2020-05-09 DIAGNOSIS — G4700 Insomnia, unspecified: Secondary | ICD-10-CM | POA: Diagnosis not present

## 2020-05-09 MED ORDER — LIDOCAINE-PRILOCAINE 2.5-2.5 % EX CREA: 1.0000 "application " | TOPICAL_CREAM | Freq: Three times a day (TID) | CUTANEOUS | 6 refills | Status: DC | PRN

## 2020-05-09 MED ORDER — DICLOFENAC SODIUM 1 % EX GEL: 4.0000 g | Freq: Three times a day (TID) | CUTANEOUS | 0 refills | Status: DC | PRN

## 2020-05-09 MED ORDER — DICLOFENAC SODIUM 1 % EX CREA: 4.0000 g | TOPICAL_CREAM | Freq: Three times a day (TID) | CUTANEOUS | 11 refills | Status: DC | PRN

## 2020-05-09 MED ORDER — PREGABALIN 50 MG PO CAPS: 50.0000 mg | ORAL_CAPSULE | Freq: Three times a day (TID) | ORAL | 5 refills | Status: DC

## 2020-05-09 NOTE — Procedures (Signed)
Full Name: Tabitha Murphy Gender: Female MRN #: [id number] Date of Birth: [birth year]    Visit Date: 05/09/2020 09:30 Age: 79 Years Examining Physician: Marcial Pacas, MD  Referring Physician: Marcial Pacas, MD Height: 5 feet 1 inch History: 79 year old female complains of bilateral feet paresthesia  Summary of the tests: Nerve conduction study: Bilateral sural, superficial peroneal sensory responses were normal.  Bilateral median, ulnar sensory responses were normal.  Bilateral tibial, peroneal to EDB, right median and ulnar motor responses were normal.  Electromyography:  Selected needle examination of right upper and lower extremity muscles, right cervical and lumbosacral paraspinal muscles were normal.  Conclusion:  This is essentially a normal study.  There is no electrodiagnostic evidence of large fiber peripheral neuropathy, right cervical or lumbosacral radiculopathy.   ------------------------------- Marcial Pacas, M.D. PhD  [facility] [address] [place], [place] [address] Tel: [phone] Fax: [phone]  Verbal informed consent was obtained from the patient, patient was informed of potential risk of procedure, including bruising, bleeding, hematoma formation, infection, muscle weakness, muscle pain, numbness, among others.        [name]    Nerve / Sites Muscle Latency Ref. Amplitude Ref. Rel Amp Segments Distance Velocity Ref. Area    ms ms mV mV %  cm m/s m/s mVms  R Median - APB     Wrist APB 3.9 ?4.4 4.2 ?4.0 100 Wrist - APB 7   14.5     Upper arm APB 7.8  4.1  97.2 Upper arm - Wrist 21 54 ?49 14.5  R Ulnar - ADM     Wrist ADM 2.6 ?3.3 10.7 ?6.0 100 Wrist - ADM 7   26.9     B.Elbow ADM 5.4  9.7  90.9 B.Elbow - Wrist 19 67 ?49 24.1     A.Elbow ADM 7.1  9.2  94.5 A.Elbow - B.Elbow 10 62 ?49 23.7  L Peroneal - EDB     Ankle EDB 3.7 ?6.5 3.7 ?2.0 100 Ankle - EDB 9   14.8     Fib head EDB 9.4  3.3  88.7 Fib head - Ankle 26 46 ?44  13.1     Pop fossa EDB 11.6  2.9  89.7 Pop fossa - Fib head 10 45 ?44 11.9         Pop fossa - Ankle      R Peroneal - EDB     Ankle EDB 5.0 ?6.5 5.9 ?2.0 100 Ankle - EDB 9   18.7     Fib head EDB 10.7  5.5  92.8 Fib head - Ankle 25 44 ?44 17.9     Pop fossa EDB 13.0  4.5  82.4 Pop fossa - Fib head 10 44 ?44 14.0         Pop fossa - Ankle      L Tibial - AH     Ankle AH 4.4 ?5.8 7.4 ?4.0 100 Ankle - AH 9   15.4     Pop fossa AH 12.7  5.8  78.1 Pop fossa - Ankle 33 40 ?41 14.1  R Tibial - AH     Ankle AH 4.3 ?5.8 7.0 ?4.0 100 Ankle - AH 9   14.8     Pop fossa AH 13.7  4.7  67.3 Pop fossa - Ankle 33 35 ?41 13.8                    SNC  Nerve / Sites Rec. Site Peak Lat Ref.  Amp Ref. Segments Distance Peak Diff Ref.    ms ms V V  cm ms ms  L Sural - Ankle (Calf)     Calf Ankle 4.1 ?4.4 7 ?6 Calf - Ankle 14    R Sural - Ankle (Calf)     Calf Ankle 3.5 ?4.4 6 ?6 Calf - Ankle 14    L Superficial peroneal - Ankle     Lat leg Ankle 4.1 ?4.4 6 ?6 Lat leg - Ankle 14    R Superficial peroneal - Ankle     Lat leg Ankle 4.4 ?4.4 6 ?6 Lat leg - Ankle 14    R Median, Ulnar - Transcarpal comparison     Median Palm Wrist 2.2 ?2.2 53 ?35 Median Palm - Wrist 8       Ulnar Palm Wrist 2.0 ?2.2 17 ?12 Ulnar Palm - Wrist 8          Median Palm - Ulnar Palm  0.3 ?0.4  R Median - Orthodromic (Dig II, Mid palm)     Dig II Wrist 3.3 ?3.4 12 ?10 Dig II - Wrist 13    R Ulnar - Orthodromic, (Dig V, Mid palm)     Dig V Wrist 3.0 ?3.1 9 ?5 Dig V - Wrist 44                     F  Wave    Nerve F Lat Ref.   ms ms  L Tibial - AH 53.6 ?56.0  R Tibial - AH 54.6 ?56.0  R Ulnar - ADM 26.5 ?32.0           EMG Summary Table    Spontaneous MUAP Recruitment  Muscle IA Fib PSW Fasc Other Amp Dur. Poly Pattern  R. Tibialis anterior Normal None None None _______ Normal Normal Normal Normal  R. Tibialis posterior Normal None None None _______ Normal Normal Normal Normal  R. Peroneus longus Normal None None  None _______ Normal Normal Normal Normal  R. Gastrocnemius (Medial head) Normal None None None _______ Normal Normal Normal Normal  R. Vastus lateralis Normal None None None _______ Normal Normal Normal Normal  R. Abductor hallucis Normal None None None _______ Normal Normal Normal Normal  R. Lumbar paraspinals (mid) Normal None None None _______ Normal Normal Normal Normal  R. Lumbar paraspinals (low) Normal None None None _______ Normal Normal Normal Normal  R. First dorsal interosseous Normal None None None _______ Normal Normal Normal Normal  R. Pronator teres Normal None None None _______ Normal Normal Normal Normal  R. Biceps brachii Normal None None None _______ Normal Normal Normal Normal  R. Deltoid Normal None None None _______ Normal Normal Normal Normal  R. Triceps brachii Normal None None None _______ Normal Normal Normal Normal  R. Cervical paraspinals Normal None None None _______ Normal Normal Normal Normal
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
## 2020-05-22 ENCOUNTER — Telehealth: Payer: Self-pay | Admitting: Neurology

## 2020-05-22 NOTE — Telephone Encounter (Signed)
Pt called wanting to know if her PT referral can be sent to [facility]. Please advise.

## 2020-05-24 ENCOUNTER — Other Ambulatory Visit: Payer: Self-pay | Admitting: Neurology

## 2020-05-24 ENCOUNTER — Encounter: Payer: Self-pay | Admitting: *Deleted

## 2020-05-28 ENCOUNTER — Telehealth: Payer: Self-pay | Admitting: Neurology

## 2020-05-28 NOTE — Telephone Encounter (Signed)
I spoke to the patient. She has stopped Lyrica because it made the pain in her legs and feet worse. She did not wish to try an alternate medication at this time. She is going to speak to her PCP about getting another referral to a physician other than in neurology. She canceled her follow up in September.

## 2020-05-28 NOTE — Telephone Encounter (Signed)
Patient called stating that she is no longer taking the Pregabalin because it gave her so much pain her knees to swell.. she stated she is going to have to go to another doctor because she needs to find out what is wrong with her and the pain she is having.

## 2020-05-28 NOTE — Addendum Note (Signed)
Addended by: Noberto Retort C on: 05/28/2020 10:16 AM   Modules accepted: Orders

## 2020-05-29 NOTE — Telephone Encounter (Signed)
Noted referral sent

## 2020-06-01 ENCOUNTER — Ambulatory Visit: Payer: PPO | Admitting: Neurology

## 2020-06-11 DIAGNOSIS — M545 Low back pain: Secondary | ICD-10-CM | POA: Diagnosis not present

## 2020-06-11 DIAGNOSIS — Z6834 Body mass index (BMI) 34.0-34.9, adult: Secondary | ICD-10-CM | POA: Diagnosis not present

## 2020-06-11 DIAGNOSIS — I10 Essential (primary) hypertension: Secondary | ICD-10-CM | POA: Diagnosis not present

## 2020-06-13 DIAGNOSIS — G4733 Obstructive sleep apnea (adult) (pediatric): Secondary | ICD-10-CM | POA: Diagnosis not present

## 2020-07-03 DIAGNOSIS — M545 Low back pain: Secondary | ICD-10-CM | POA: Diagnosis not present

## 2020-07-06 DIAGNOSIS — M545 Low back pain: Secondary | ICD-10-CM | POA: Diagnosis not present

## 2020-07-09 DIAGNOSIS — E039 Hypothyroidism, unspecified: Secondary | ICD-10-CM | POA: Diagnosis not present

## 2020-07-09 DIAGNOSIS — J45909 Unspecified asthma, uncomplicated: Secondary | ICD-10-CM | POA: Diagnosis not present

## 2020-07-09 DIAGNOSIS — F324 Major depressive disorder, single episode, in partial remission: Secondary | ICD-10-CM | POA: Diagnosis not present

## 2020-07-09 DIAGNOSIS — I10 Essential (primary) hypertension: Secondary | ICD-10-CM | POA: Diagnosis not present

## 2020-07-09 DIAGNOSIS — F329 Major depressive disorder, single episode, unspecified: Secondary | ICD-10-CM | POA: Diagnosis not present

## 2020-07-09 DIAGNOSIS — E7800 Pure hypercholesterolemia, unspecified: Secondary | ICD-10-CM | POA: Diagnosis not present

## 2020-07-09 DIAGNOSIS — G4700 Insomnia, unspecified: Secondary | ICD-10-CM | POA: Diagnosis not present

## 2020-07-09 DIAGNOSIS — K219 Gastro-esophageal reflux disease without esophagitis: Secondary | ICD-10-CM | POA: Diagnosis not present

## 2020-07-11 DIAGNOSIS — M545 Low back pain: Secondary | ICD-10-CM | POA: Diagnosis not present

## 2020-07-16 DIAGNOSIS — Z1231 Encounter for screening mammogram for malignant neoplasm of breast: Secondary | ICD-10-CM | POA: Diagnosis not present

## 2020-07-17 DIAGNOSIS — M545 Low back pain: Secondary | ICD-10-CM | POA: Diagnosis not present

## 2020-07-18 DIAGNOSIS — Z961 Presence of intraocular lens: Secondary | ICD-10-CM | POA: Diagnosis not present

## 2020-07-18 DIAGNOSIS — H1131 Conjunctival hemorrhage, right eye: Secondary | ICD-10-CM | POA: Diagnosis not present

## 2020-07-20 DIAGNOSIS — M545 Low back pain: Secondary | ICD-10-CM | POA: Diagnosis not present

## 2020-07-24 DIAGNOSIS — M545 Low back pain: Secondary | ICD-10-CM | POA: Diagnosis not present

## 2020-07-26 DIAGNOSIS — M545 Low back pain: Secondary | ICD-10-CM | POA: Diagnosis not present

## 2020-07-30 DIAGNOSIS — M545 Low back pain: Secondary | ICD-10-CM | POA: Diagnosis not present

## 2020-07-30 DIAGNOSIS — R921 Mammographic calcification found on diagnostic imaging of breast: Secondary | ICD-10-CM | POA: Diagnosis not present

## 2020-08-13 ENCOUNTER — Ambulatory Visit: Payer: PPO | Admitting: Neurology

## 2020-08-15 DIAGNOSIS — M545 Low back pain: Secondary | ICD-10-CM | POA: Diagnosis not present

## 2020-08-22 DIAGNOSIS — M545 Low back pain: Secondary | ICD-10-CM | POA: Diagnosis not present

## 2020-08-24 DIAGNOSIS — M545 Low back pain: Secondary | ICD-10-CM | POA: Diagnosis not present

## 2020-08-29 DIAGNOSIS — M545 Low back pain: Secondary | ICD-10-CM | POA: Diagnosis not present

## 2020-08-31 DIAGNOSIS — M5459 Other low back pain: Secondary | ICD-10-CM | POA: Diagnosis not present

## 2020-09-05 DIAGNOSIS — M5416 Radiculopathy, lumbar region: Secondary | ICD-10-CM | POA: Diagnosis not present

## 2020-09-10 DIAGNOSIS — M5416 Radiculopathy, lumbar region: Secondary | ICD-10-CM | POA: Diagnosis not present

## 2020-09-14 DIAGNOSIS — M5416 Radiculopathy, lumbar region: Secondary | ICD-10-CM | POA: Diagnosis not present

## 2020-09-15 ENCOUNTER — Ambulatory Visit: Payer: PPO | Attending: Internal Medicine

## 2020-09-15 DIAGNOSIS — Z23 Encounter for immunization: Secondary | ICD-10-CM

## 2020-09-15 NOTE — Progress Notes (Signed)
   Covid-19 Vaccination Clinic  Name:  DABNEY DEVER    MRN: [id number] DOB: January 25, 1941  09/15/2020  Ms. Colwell was observed post Covid-19 immunization for 15 minutes without incident. She was provided with Vaccine Information Sheet and instruction to access the V-Safe system.   Ms. Renzulli was instructed to call 911 with any severe reactions post vaccine: Marland Kitchen Difficulty breathing  . Swelling of face and throat  . A fast heartbeat  . A bad rash all over body  . Dizziness and weakness

## 2020-09-19 DIAGNOSIS — M5416 Radiculopathy, lumbar region: Secondary | ICD-10-CM | POA: Diagnosis not present

## 2020-09-21 DIAGNOSIS — M5416 Radiculopathy, lumbar region: Secondary | ICD-10-CM | POA: Diagnosis not present

## 2020-09-24 DIAGNOSIS — M5416 Radiculopathy, lumbar region: Secondary | ICD-10-CM | POA: Diagnosis not present

## 2020-09-28 DIAGNOSIS — M5416 Radiculopathy, lumbar region: Secondary | ICD-10-CM | POA: Diagnosis not present

## 2020-10-15 DIAGNOSIS — I10 Essential (primary) hypertension: Secondary | ICD-10-CM | POA: Diagnosis not present

## 2020-10-15 DIAGNOSIS — E7800 Pure hypercholesterolemia, unspecified: Secondary | ICD-10-CM | POA: Diagnosis not present

## 2020-10-15 DIAGNOSIS — F419 Anxiety disorder, unspecified: Secondary | ICD-10-CM | POA: Diagnosis not present

## 2020-10-15 DIAGNOSIS — Z23 Encounter for immunization: Secondary | ICD-10-CM | POA: Diagnosis not present

## 2020-10-15 DIAGNOSIS — G4700 Insomnia, unspecified: Secondary | ICD-10-CM | POA: Diagnosis not present

## 2020-10-15 DIAGNOSIS — K219 Gastro-esophageal reflux disease without esophagitis: Secondary | ICD-10-CM | POA: Diagnosis not present

## 2020-10-15 DIAGNOSIS — J45909 Unspecified asthma, uncomplicated: Secondary | ICD-10-CM | POA: Diagnosis not present

## 2020-10-15 DIAGNOSIS — R7309 Other abnormal glucose: Secondary | ICD-10-CM | POA: Diagnosis not present

## 2020-10-15 DIAGNOSIS — E039 Hypothyroidism, unspecified: Secondary | ICD-10-CM | POA: Diagnosis not present

## 2020-10-15 DIAGNOSIS — L299 Pruritus, unspecified: Secondary | ICD-10-CM | POA: Diagnosis not present

## 2020-10-15 DIAGNOSIS — M545 Low back pain, unspecified: Secondary | ICD-10-CM | POA: Diagnosis not present

## 2020-10-15 DIAGNOSIS — G629 Polyneuropathy, unspecified: Secondary | ICD-10-CM | POA: Diagnosis not present

## 2020-12-13 DIAGNOSIS — G4733 Obstructive sleep apnea (adult) (pediatric): Secondary | ICD-10-CM | POA: Diagnosis not present

## 2021-01-22 DIAGNOSIS — G4733 Obstructive sleep apnea (adult) (pediatric): Secondary | ICD-10-CM | POA: Diagnosis not present

## 2021-01-26 DIAGNOSIS — M5442 Lumbago with sciatica, left side: Secondary | ICD-10-CM | POA: Diagnosis not present

## 2021-02-06 DIAGNOSIS — M5442 Lumbago with sciatica, left side: Secondary | ICD-10-CM | POA: Diagnosis not present

## 2021-02-13 ENCOUNTER — Other Ambulatory Visit: Payer: Self-pay | Admitting: Chiropractic Medicine

## 2021-02-13 DIAGNOSIS — M545 Low back pain, unspecified: Secondary | ICD-10-CM

## 2021-02-13 DIAGNOSIS — M5416 Radiculopathy, lumbar region: Secondary | ICD-10-CM | POA: Diagnosis not present

## 2021-02-14 ENCOUNTER — Other Ambulatory Visit: Payer: Self-pay

## 2021-02-14 ENCOUNTER — Emergency Department (HOSPITAL_COMMUNITY): Payer: PPO

## 2021-02-14 ENCOUNTER — Encounter (HOSPITAL_COMMUNITY): Payer: Self-pay | Admitting: Emergency Medicine

## 2021-02-14 ENCOUNTER — Inpatient Hospital Stay (HOSPITAL_COMMUNITY)
Admission: EM | Admit: 2021-02-14 | Discharge: 2021-02-17 | DRG: 309 | Disposition: A | Discharge status: Home / Self Care | Payer: PPO | Source: Ambulatory Visit | Attending: Internal Medicine | Admitting: Internal Medicine

## 2021-02-14 DIAGNOSIS — Z6832 Body mass index (BMI) 32.0-32.9, adult: Secondary | ICD-10-CM

## 2021-02-14 DIAGNOSIS — I472 Ventricular tachycardia: Secondary | ICD-10-CM | POA: Diagnosis not present

## 2021-02-14 DIAGNOSIS — E669 Obesity, unspecified: Secondary | ICD-10-CM | POA: Diagnosis not present

## 2021-02-14 DIAGNOSIS — E039 Hypothyroidism, unspecified: Secondary | ICD-10-CM | POA: Diagnosis present

## 2021-02-14 DIAGNOSIS — R079 Chest pain, unspecified: Secondary | ICD-10-CM | POA: Diagnosis present

## 2021-02-14 DIAGNOSIS — I480 Paroxysmal atrial fibrillation: Secondary | ICD-10-CM

## 2021-02-14 DIAGNOSIS — I2510 Atherosclerotic heart disease of native coronary artery without angina pectoris: Secondary | ICD-10-CM | POA: Diagnosis present

## 2021-02-14 DIAGNOSIS — T502X5A Adverse effect of carbonic-anhydrase inhibitors, benzothiadiazides and other diuretics, initial encounter: Secondary | ICD-10-CM | POA: Diagnosis not present

## 2021-02-14 DIAGNOSIS — I10 Essential (primary) hypertension: Secondary | ICD-10-CM | POA: Diagnosis present

## 2021-02-14 DIAGNOSIS — R42 Dizziness and giddiness: Secondary | ICD-10-CM | POA: Diagnosis not present

## 2021-02-14 DIAGNOSIS — R61 Generalized hyperhidrosis: Secondary | ICD-10-CM | POA: Diagnosis not present

## 2021-02-14 DIAGNOSIS — Z79890 Hormone replacement therapy (postmenopausal): Secondary | ICD-10-CM

## 2021-02-14 DIAGNOSIS — B354 Tinea corporis: Secondary | ICD-10-CM | POA: Diagnosis not present

## 2021-02-14 DIAGNOSIS — I4819 Other persistent atrial fibrillation: Secondary | ICD-10-CM | POA: Diagnosis not present

## 2021-02-14 DIAGNOSIS — I110 Hypertensive heart disease with heart failure: Secondary | ICD-10-CM | POA: Diagnosis not present

## 2021-02-14 DIAGNOSIS — I5022 Chronic systolic (congestive) heart failure: Secondary | ICD-10-CM | POA: Diagnosis present

## 2021-02-14 DIAGNOSIS — E876 Hypokalemia: Secondary | ICD-10-CM | POA: Diagnosis not present

## 2021-02-14 DIAGNOSIS — Z79899 Other long term (current) drug therapy: Secondary | ICD-10-CM

## 2021-02-14 DIAGNOSIS — Z20822 Contact with and (suspected) exposure to covid-19: Secondary | ICD-10-CM | POA: Diagnosis present

## 2021-02-14 DIAGNOSIS — I4891 Unspecified atrial fibrillation: Secondary | ICD-10-CM | POA: Diagnosis not present

## 2021-02-14 DIAGNOSIS — E871 Hypo-osmolality and hyponatremia: Secondary | ICD-10-CM | POA: Diagnosis present

## 2021-02-14 DIAGNOSIS — E785 Hyperlipidemia, unspecified: Secondary | ICD-10-CM | POA: Diagnosis not present

## 2021-02-14 DIAGNOSIS — D72829 Elevated white blood cell count, unspecified: Secondary | ICD-10-CM | POA: Diagnosis not present

## 2021-02-14 DIAGNOSIS — R0602 Shortness of breath: Secondary | ICD-10-CM | POA: Diagnosis not present

## 2021-02-14 DIAGNOSIS — I420 Dilated cardiomyopathy: Secondary | ICD-10-CM | POA: Diagnosis not present

## 2021-02-14 DIAGNOSIS — R0789 Other chest pain: Secondary | ICD-10-CM | POA: Diagnosis not present

## 2021-02-14 LAB — CBC
HCT: 42.5 % (ref 36.0–46.0)
Hemoglobin: 14.2 g/dL (ref 12.0–15.0)
MCH: 31.9 pg (ref 26.0–34.0)
MCHC: 33.4 g/dL (ref 30.0–36.0)
MCV: 95.5 fL (ref 80.0–100.0)
Platelets: 291 10*3/uL (ref 150–400)
RBC: 4.45 MIL/uL (ref 3.87–5.11)
RDW: 13.8 % (ref 11.5–15.5)
WBC: 16 10*3/uL — ABNORMAL HIGH (ref 4.0–10.5)
nRBC: 0 % (ref 0.0–0.2)

## 2021-02-14 LAB — TROPONIN I (HIGH SENSITIVITY)
Troponin I (High Sensitivity): 14 ng/L (ref <18)
Troponin I (High Sensitivity): 15 ng/L (ref <18)

## 2021-02-14 LAB — BASIC METABOLIC PANEL
Anion gap: 14 (ref 5–15)
BUN: 25 mg/dL — ABNORMAL HIGH (ref 8–23)
CO2: 23 mmol/L (ref 22–32)
Calcium: 9.3 mg/dL (ref 8.9–10.3)
Chloride: 90 mmol/L — ABNORMAL LOW (ref 98–111)
Creatinine, Ser: 0.94 mg/dL (ref 0.44–1.00)
GFR, Estimated: >60 mL/min (ref >60)
Glucose, Bld: 178 mg/dL — ABNORMAL HIGH (ref 70–99)
Potassium: 3.9 mmol/L (ref 3.5–5.1)
Sodium: 127 mmol/L — ABNORMAL LOW (ref 135–145)

## 2021-02-14 MED ORDER — APIXABAN 5 MG PO TABS
5.0000 mg | ORAL_TABLET | Freq: Two times a day (BID) | ORAL | Status: DC
  Administered 2021-02-14 – 2021-02-17 (×6): 5 mg via ORAL
  Filled 2021-02-14 (×6): qty 1

## 2021-02-14 NOTE — Progress Notes (Signed)
Asked to evaluate patient for chest pain. Pt was sitting in lobby with friends, on her phone. When asked if she was having CP, she acknowledged it was present, but that she was on phone trying to call MD. Pt pale, diaphoretic, [facility] front desk staff advised to call 911, but pt refused to allow EMT intervention. Reviewed with patient that any signs/symptons of chest pain was emergent, but she still refused 911 call,and attempted to leave. Able to obtain BP while patient on phone with Dr. Despina Hick. Advised patient that she could be having a heart attack with elevated BP, diaphoresis, chest pain, but still refused 911 call. Patient left facility, accompanied to car, where she did speak with someone at MD's office who advised her to be seen at walk-in clinic at [facility]. Patient drove herself to clinic.
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
## 2021-02-14 NOTE — ED Triage Notes (Signed)
Per pt, states she has been having left sided CP radiating down left arm and diaphoresis-states she has had these symptoms 3 times in the last 2 weeks-states she saw her PCP today and EKG showed AFib

## 2021-02-15 ENCOUNTER — Encounter (HOSPITAL_COMMUNITY): Payer: Self-pay | Admitting: Internal Medicine

## 2021-02-15 ENCOUNTER — Inpatient Hospital Stay (HOSPITAL_COMMUNITY): Payer: PPO

## 2021-02-15 DIAGNOSIS — E669 Obesity, unspecified: Secondary | ICD-10-CM | POA: Diagnosis present

## 2021-02-15 DIAGNOSIS — I10 Essential (primary) hypertension: Secondary | ICD-10-CM | POA: Diagnosis present

## 2021-02-15 DIAGNOSIS — E785 Hyperlipidemia, unspecified: Secondary | ICD-10-CM | POA: Diagnosis present

## 2021-02-15 DIAGNOSIS — I420 Dilated cardiomyopathy: Secondary | ICD-10-CM | POA: Diagnosis not present

## 2021-02-15 DIAGNOSIS — E876 Hypokalemia: Secondary | ICD-10-CM | POA: Diagnosis present

## 2021-02-15 DIAGNOSIS — I480 Paroxysmal atrial fibrillation: Secondary | ICD-10-CM | POA: Diagnosis present

## 2021-02-15 DIAGNOSIS — E871 Hypo-osmolality and hyponatremia: Secondary | ICD-10-CM | POA: Diagnosis present

## 2021-02-15 DIAGNOSIS — E039 Hypothyroidism, unspecified: Secondary | ICD-10-CM | POA: Diagnosis present

## 2021-02-15 DIAGNOSIS — D72829 Elevated white blood cell count, unspecified: Secondary | ICD-10-CM | POA: Diagnosis present

## 2021-02-15 DIAGNOSIS — R079 Chest pain, unspecified: Secondary | ICD-10-CM

## 2021-02-15 DIAGNOSIS — Z79890 Hormone replacement therapy (postmenopausal): Secondary | ICD-10-CM | POA: Diagnosis not present

## 2021-02-15 DIAGNOSIS — I4891 Unspecified atrial fibrillation: Secondary | ICD-10-CM | POA: Diagnosis present

## 2021-02-15 DIAGNOSIS — Z6832 Body mass index (BMI) 32.0-32.9, adult: Secondary | ICD-10-CM | POA: Diagnosis not present

## 2021-02-15 DIAGNOSIS — Z20822 Contact with and (suspected) exposure to covid-19: Secondary | ICD-10-CM | POA: Diagnosis present

## 2021-02-15 DIAGNOSIS — I2510 Atherosclerotic heart disease of native coronary artery without angina pectoris: Secondary | ICD-10-CM | POA: Diagnosis present

## 2021-02-15 DIAGNOSIS — I472 Ventricular tachycardia: Secondary | ICD-10-CM | POA: Diagnosis present

## 2021-02-15 DIAGNOSIS — Z79899 Other long term (current) drug therapy: Secondary | ICD-10-CM | POA: Diagnosis not present

## 2021-02-15 DIAGNOSIS — I5022 Chronic systolic (congestive) heart failure: Secondary | ICD-10-CM | POA: Diagnosis present

## 2021-02-15 DIAGNOSIS — I110 Hypertensive heart disease with heart failure: Secondary | ICD-10-CM | POA: Diagnosis present

## 2021-02-15 DIAGNOSIS — I4819 Other persistent atrial fibrillation: Secondary | ICD-10-CM | POA: Diagnosis not present

## 2021-02-15 DIAGNOSIS — T502X5A Adverse effect of carbonic-anhydrase inhibitors, benzothiadiazides and other diuretics, initial encounter: Secondary | ICD-10-CM | POA: Diagnosis present

## 2021-02-15 LAB — RESP PANEL BY RT-PCR (FLU A&B, COVID) ARPGX2
Influenza A by PCR: NEGATIVE
Influenza B by PCR: NEGATIVE
SARS Coronavirus 2 by RT PCR: NEGATIVE

## 2021-02-15 LAB — BASIC METABOLIC PANEL
Anion gap: 13 (ref 5–15)
Anion gap: 13 (ref 5–15)
BUN: 23 mg/dL (ref 8–23)
BUN: 19 mg/dL (ref 8–23)
CO2: 28 mmol/L (ref 22–32)
CO2: 24 mmol/L (ref 22–32)
Calcium: 8.9 mg/dL (ref 8.9–10.3)
Calcium: 8.7 mg/dL — ABNORMAL LOW (ref 8.9–10.3)
Chloride: 89 mmol/L — ABNORMAL LOW (ref 98–111)
Chloride: 94 mmol/L — ABNORMAL LOW (ref 98–111)
Creatinine, Ser: 0.8 mg/dL (ref 0.44–1.00)
Creatinine, Ser: 0.72 mg/dL (ref 0.44–1.00)
GFR, Estimated: >60 mL/min (ref >60)
GFR, Estimated: >60 mL/min (ref >60)
Glucose, Bld: 128 mg/dL — ABNORMAL HIGH (ref 70–99)
Glucose, Bld: 87 mg/dL (ref 70–99)
Potassium: 3.1 mmol/L — ABNORMAL LOW (ref 3.5–5.1)
Potassium: 2.9 mmol/L — ABNORMAL LOW (ref 3.5–5.1)
Sodium: 130 mmol/L — ABNORMAL LOW (ref 135–145)
Sodium: 131 mmol/L — ABNORMAL LOW (ref 135–145)

## 2021-02-15 LAB — SODIUM, URINE, RANDOM: Sodium, Ur: 18 mmol/L

## 2021-02-15 LAB — ECHOCARDIOGRAM COMPLETE
Area-P 1/2: 3.24 cm2
Height: 62 in
S' Lateral: 3.3 cm
Weight: 2821.89 oz

## 2021-02-15 LAB — TSH: TSH: 0.16 u[IU]/mL — ABNORMAL LOW (ref 0.350–4.500)

## 2021-02-15 LAB — MAGNESIUM: Magnesium: 1.9 mg/dL (ref 1.7–2.4)

## 2021-02-15 MED ORDER — ATORVASTATIN CALCIUM 40 MG PO TABS: 40.0000 mg | ORAL_TABLET | Freq: Every evening | ORAL | Status: DC

## 2021-02-15 MED ORDER — ASPIRIN EC 81 MG PO TBEC
81.0000 mg | DELAYED_RELEASE_TABLET | Freq: Every day | ORAL | Status: DC
  Administered 2021-02-15 – 2021-02-17 (×3): 81 mg via ORAL
  Filled 2021-02-15 (×3): qty 1

## 2021-02-15 MED ORDER — ACETAMINOPHEN 650 MG RE SUPP: 650.0000 mg | Freq: Four times a day (QID) | RECTAL | Status: DC | PRN

## 2021-02-15 MED ORDER — ACETAMINOPHEN 325 MG PO TABS
650.0000 mg | ORAL_TABLET | Freq: Four times a day (QID) | ORAL | Status: DC | PRN
  Administered 2021-02-16 (×2): 650 mg via ORAL
  Filled 2021-02-15: qty 2

## 2021-02-15 MED ORDER — SODIUM CHLORIDE 0.9 % IV SOLN: INTRAVENOUS | Status: DC

## 2021-02-15 MED ORDER — PREDNISONE 20 MG PO TABS
20.0000 mg | ORAL_TABLET | Freq: Every day | ORAL | Status: DC
  Administered 2021-02-15 – 2021-02-17 (×3): 20 mg via ORAL
  Filled 2021-02-15 (×3): qty 1

## 2021-02-15 MED ORDER — CYCLOBENZAPRINE HCL 10 MG PO TABS
5.0000 mg | ORAL_TABLET | Freq: Three times a day (TID) | ORAL | Status: DC | PRN
Start: 2021-02-15 — End: 2021-02-17
  Administered 2021-02-15 – 2021-02-16 (×2): 10 mg via ORAL
  Filled 2021-02-15 (×2): qty 1

## 2021-02-15 MED ORDER — POTASSIUM CHLORIDE CRYS ER 20 MEQ PO TBCR
40.0000 meq | EXTENDED_RELEASE_TABLET | Freq: Once | ORAL | Status: AC
  Administered 2021-02-15: 40 meq via ORAL
  Filled 2021-02-15: qty 2

## 2021-02-15 MED ORDER — MAGNESIUM SULFATE IN D5W 1-5 GM/100ML-% IV SOLN
1.0000 g | Freq: Once | INTRAVENOUS | Status: AC
  Administered 2021-02-15: 1 g via INTRAVENOUS
  Filled 2021-02-15: qty 100

## 2021-02-15 MED ORDER — ALBUTEROL SULFATE HFA 108 (90 BASE) MCG/ACT IN AERS
2.0000 | INHALATION_SPRAY | RESPIRATORY_TRACT | Status: DC | PRN
  Administered 2021-02-16: 2 via RESPIRATORY_TRACT
  Filled 2021-02-15: qty 6.7

## 2021-02-15 MED ORDER — HYDROCHLOROTHIAZIDE 25 MG PO TABS: 25.0000 mg | ORAL_TABLET | Freq: Every morning | ORAL | Status: DC

## 2021-02-15 MED ORDER — OMEPRAZOLE 20 MG PO CPDR
20.0000 mg | DELAYED_RELEASE_CAPSULE | Freq: Every day | ORAL | Status: DC
  Administered 2021-02-15 – 2021-02-17 (×3): 20 mg via ORAL
  Filled 2021-02-15 (×3): qty 1

## 2021-02-15 MED ORDER — LEVOTHYROXINE SODIUM 50 MCG PO TABS
50.0000 ug | ORAL_TABLET | Freq: Every day | ORAL | Status: DC
  Administered 2021-02-15 – 2021-02-16 (×2): 50 ug via ORAL
  Filled 2021-02-15 (×2): qty 1

## 2021-02-15 MED ORDER — OMEPRAZOLE MAGNESIUM 20 MG PO TBEC: 20.0000 mg | DELAYED_RELEASE_TABLET | Freq: Every day | ORAL | Status: DC

## 2021-02-15 MED ORDER — IRBESARTAN 300 MG PO TABS
300.0000 mg | ORAL_TABLET | Freq: Every morning | ORAL | Status: DC
  Administered 2021-02-15 – 2021-02-17 (×3): 300 mg via ORAL
  Filled 2021-02-15 (×3): qty 1

## 2021-02-15 MED ORDER — ALPRAZOLAM 0.5 MG PO TABS
0.5000 mg | ORAL_TABLET | Freq: Every day | ORAL | Status: DC | PRN
  Administered 2021-02-15 – 2021-02-16 (×2): 0.5 mg via ORAL
  Filled 2021-02-15 (×3): qty 1

## 2021-02-15 MED ORDER — ATENOLOL 50 MG PO TABS
100.0000 mg | ORAL_TABLET | Freq: Every day | ORAL | Status: DC
  Administered 2021-02-15 – 2021-02-17 (×3): 100 mg via ORAL
  Filled 2021-02-15: qty 2
  Filled 2021-02-15: qty 1
  Filled 2021-02-15: qty 2

## 2021-02-15 MED ORDER — POTASSIUM CHLORIDE CRYS ER 20 MEQ PO TBCR
40.0000 meq | EXTENDED_RELEASE_TABLET | ORAL | Status: AC
  Administered 2021-02-15 (×2): 40 meq via ORAL
  Filled 2021-02-15 (×2): qty 2

## 2021-02-15 NOTE — Progress Notes (Signed)
  Echocardiogram 2D Echocardiogram has been performed.  Tabitha Murphy G Samaj Wessells 02/15/2021, 3:08 PM

## 2021-02-15 NOTE — Consult Note (Addendum)
Cardiology Consultation:   Patient ID: MYKENNA VIELE MRN: [id number]; DOB: 06-06-1941  Admit date: 02/14/2021 Date of Consult: 02/15/2021  PCP:  [name], [place]  Cardiologist:  Fransico Him, MD (new -remotely Dr. Marlou Porch in 2012) Advanced Practice Provider:  No care team member to display Electrophysiologist:  None    Patient Profile:   Tabitha Murphy is a 80 y.o. female with a hx of mild CAD by cath 2012, HTN, hypothyroidism, hiatal hernia, diverticulosis, mild hyponatremia by prior labs, pre-DM (A1C 6 in 03/2020), chronic back issues who is being seen today for the evaluation of chest pain/afib at the request of Dr. Hal Hope.  History of Present Illness:   She has a remote history of chest pain and abnormal stress test with cath in 2012 showing 30% mid AV groove Cx but otherwise no significant CAD, EF 60%. She has done well over the years without known cardiac issues. No prior h/o atrial fib.  She has had 3 incidents of chest pain over the last 3 weeks, all occurring at rest, lasting max 20-30 minutes, resolving spontaneously without intervention. She attends exercise at the [facility] regularly and does not typically get angina. She completed her workout yesterday without cardiovascular limitation. (She was awaiting an outpatient MRI on her back to evaluate some leg pain she has been having - states this is longstanding due to a prior spiral fracture but denies any falls/unsteadiness.) While getting cleaned up after her workout, she developed significant left sided chest pressure. She began perspiring profusely which was really unusual for her. She denied any SOB or palpitations. She went outside to meet a friend she was planning on going to lunch with and told her she would not be able to go due to the pain.  The [facility] staff got involved because she looked pale and diaphoretic. She declined EMS and drove to the hospital. While en route her symptoms  spontaneously resolved. Here in the ED she was found to have newly recognized rate control atrial fib. She remains in afib at this time without cardiac awareness. Telemetry shows normal rates in the 60s-80s except some nocturnal bradycardia (briefly) in the upper 40s, no pauses >2 sec. She did have 1 14 beat run of NSVT overnight, asymptomatic. Troponins negative x 2. Otherwise labs notable for hyponatremia (nadir 127), hypokalemia at 3.1 today, leukocytosis and suppressed TSH of 0.160. Covid panel negative. CXR NAD. She is really hoping to go home. Father and brother had cardiac disease - father died of MI in his early 28s, other concrete details not really clear for the remainder of the family. She is on atenolol 100mg  at home which was continued here. Home HCTZ was discontinued due to hyponatremia. She was started on apixaban. No known hx of stroke/TIA. Denies prior issues with bleeding.   Past Medical History:  Diagnosis Date  . Diverticulosis   . Hiatal hernia   . S/P laparoscopic cholecystectomy 07/28/2011   23 years ago  . Thyroid goiter   . Unspecified essential hypertension     Past Surgical History:  Procedure Laterality Date  . child birth     x3  . CHOLECYSTECTOMY       Home Medications:  Prior to Admission medications   Medication Sig Start Date End Date Taking? Authorizing Provider  atenolol (TENORMIN) 100 MG tablet Take 100 mg by mouth daily.   Yes [provider]  b complex vitamins tablet Take 1 tablet by mouth daily.  Yes [provider]  Biotin 1 MG CAPS Take 1 capsule by mouth daily.   Yes [provider]  calcium carbonate (OS-CAL) 600 MG TABS Take 600 mg by mouth 2 (two) times daily with a meal.   Yes [provider]  hydrochlorothiazide (HYDRODIURIL) 25 MG tablet Take 25 mg by mouth every morning. 12/07/20  Yes [provider]  HYDROcodone-acetaminophen (NORCO/VICODIN) 5-325 MG tablet Take 1 tablet by mouth every 6 (six)  hours as needed for moderate pain.   Yes [provider]  irbesartan (AVAPRO) 300 MG tablet Take 300 mg by mouth every morning. 02/02/21  Yes [provider]  levothyroxine (SYNTHROID, LEVOTHROID) 50 MCG tablet Take 50 mcg by mouth daily.   Yes [provider]  Multiple Vitamin (MULTIVITAMIN) tablet Take 1 tablet by mouth daily.   Yes [provider]  omeprazole (PRILOSEC OTC) 20 MG tablet Take 20 mg by mouth daily.   Yes [provider]  predniSONE (DELTASONE) 20 MG tablet Take 20 mg by mouth daily with breakfast. 02/06/21  Yes [provider]  promethazine (PHENERGAN) 25 MG tablet Take 25 mg by mouth every 6 (six) hours as needed for nausea or vomiting.   Yes [provider]  Selenium 100 MCG TABS Take 1 tablet by mouth daily.   Yes [provider]  vitamin C (ASCORBIC ACID) 500 MG tablet Take 500 mg by mouth daily.   Yes [provider]  vitamin E (VITAMIN E) 180 MG (400 UNITS) capsule Take 400 Units by mouth daily.   Yes [provider]  albuterol (VENTOLIN HFA) 108 (90 Base) MCG/ACT inhaler Inhale 2 puffs into the lungs every 4 (four) hours as needed for wheezing or shortness of breath. 03/18/20   [provider]  ALPRAZolam Duanne Moron) 0.5 MG tablet Take 0.5 mg by mouth daily as needed for anxiety.    [provider]  cyclobenzaprine (FLEXERIL) 10 MG tablet Take 5-10 mg by mouth 3 (three) times daily as needed for muscle spasms. 01/12/21   [provider]  diclofenac Sodium (VOLTAREN) 1 % GEL APPLY 4 G TOPICALLY 3 (THREE) TIMES DAILY AS NEEDED. Patient not taking: Reported on 02/14/2021 05/24/20   Marcial Pacas, MD  lidocaine-prilocaine (EMLA) cream Apply 1 application topically 3 (three) times daily as needed. Patient taking differently: Apply 1 application topically 3 (three) times daily as needed (pain). 05/09/20   Marcial Pacas, MD  valACYclovir (VALTREX) 1000 MG tablet Take 1,000 mg by mouth  daily as needed (fever blisters).    [provider]    Inpatient Medications: Scheduled Meds: . apixaban  5 mg Oral BID  . aspirin EC  81 mg Oral Daily  . atenolol  100 mg Oral Daily  . atorvastatin  40 mg Oral QPM  . irbesartan  300 mg Oral q morning  . levothyroxine  50 mcg Oral Daily  . omeprazole  20 mg Oral Daily  . potassium chloride  40 mEq Oral Once  . predniSONE  20 mg Oral Q breakfast   Continuous Infusions: . sodium chloride 50 mL/hr at 02/15/21 0614   PRN Meds: acetaminophen **OR** acetaminophen, albuterol, ALPRAZolam, cyclobenzaprine  Allergies:    Allergies  Allergen Reactions  . Codeine     REACTION: nausea  . Duloxetine Diarrhea  . Lyrica [Pregabalin] Other (See Comments)    Made pain in legs and feet worse.    Social History:   Social History   Socioeconomic History  . Marital status: Widowed  Spouse name: Not on file  . Number of children: Not on file  . Years of education: Not on file  . Highest education level: Not on file  Occupational History  . Occupation: retired  Tobacco Use  . Smoking status: Never Smoker  . Smokeless tobacco: Never Used  Substance and Sexual Activity  . Alcohol use: No  . Drug use: No  . Sexual activity: Not on file  Other Topics Concern  . Not on file  Social History Narrative  . Not on file   Social Determinants of Health   Financial Resource Strain: Not on file  Food Insecurity: Not on file  Transportation Needs: Not on file  Physical Activity: Not on file  Stress: Not on file  Social Connections: Not on file  Intimate Partner Violence: Not on file    Family History:   Family History  Problem Relation Age of Onset  . Cervical cancer Mother   . CAD Sister   . Heart attack Father        Died in early 33s with MI  . Heart disease Brother   . Colon cancer Neg Hx      ROS:  Please see the history of present illness.  All other ROS reviewed and negative.     Physical Exam/Data:    Vitals:   02/15/21 0545 02/15/21 0600 02/15/21 0615 02/15/21 0900  BP:  118/75  137/72  Pulse: 70 75 69 75  Resp: (!) 28 19 17 18   Temp:      TempSrc:      SpO2: 95% 96% 96% 97%  Weight:      Height:       No intake or output data in the 24 hours ending 02/15/21 0931 Last 3 Weights 02/14/2021 04/09/2020 05/27/2011  Weight (lbs) 176 lb 5.9 oz 177 lb 176 lb  Weight (kg) 80 kg 80.287 kg 79.833 kg     Body mass index is 32.26 kg/m.  General: Well developed, well nourished WF, in no acute distress. Head: Normocephalic, atraumatic, sclera non-icteric, no xanthomas, nares are without discharge. Neck: Negative for carotid bruits. JVP not elevated. Lungs: Clear bilaterally to auscultation without wheezes, rales, or rhonchi. Breathing is unlabored. Heart: RRR S1 S2 without murmurs, rubs, or gallops.  Abdomen: Soft, non-tender, non-distended with normoactive bowel sounds. No rebound/guarding. Extremities: No clubbing or cyanosis. No edema. Distal pedal pulses are 2+ and equal bilaterally. Neuro: Alert and oriented X 3. Moves all extremities spontaneously. Psych:  Responds to questions appropriately with a normal affect.  EKG:  The EKG was personally reviewed and demonstrates:   Atrial fibrillation 80bpm, NSIVCD, nonspecific STTW changes  Telemetry:  Telemetry was personally reviewed and demonstrates:   Telemetry shows normal rates in the 60s-80s except some nocturnal bradycardia (briefly) in the upper 40s, no pauses >2 sec. 1 14 beat run NSVT on tele overnight.  Relevant CV Studies: 01/2011   CARDIAC CATHETERIZATION   INDICATIONS:  A 80 year old female with mildly abnormal stress test with symptoms of exertional angina when walking up hill.  PROCEDURE DETAILS:  Informed consent was obtained.  Risk of stroke, heart attack, death, renal impairment, arterial damage, bleeding were explained to the patient at length.  The groin was visualized with fluoroscopy.  A 1% lidocaine was used  for local anesthesia.  Zofran was given prior to procedure due to the patient's prior intolerances with anesthesia, 2 mg of Versed and 50 mcg of fentanyl were used for conscious sedation.  A 4-French sheath  was inserted in the right femoral artery without difficulty using the modified Seldinger technique and no torque Williams right catheter and Judkins left #4 catheter were used to selectively cannulate the coronary arteries and multiple views with hand injection of Omnipaque were obtained.  An angled pigtail was used to cross the left ventricle, and a left ventriculogram was performed in the RAO position using power injection of 30 mL of contrast.  Following procedure, sheath was removed.  Hemodynamics were stable, manual compression held.  FINDINGS: 1. Left main very short, almost dual ostia of the LAD and circumflex.     No angiographic significant disease. 2. LAD - the vessel divides into four very small-caliber diagonal     branches.  No angiographically significant disease is present. 3. Circumflex artery.  The larger caliber vessel than the LAD giving     rise to two major obtuse marginal branches.  No angiographically     significant disease in the obtuse marginal branches.  In the AV     groove circumflex, there is a 30% irregularity between the second     and third obtuse marginal branch, minor, non-flow limiting. 4. Right coronary artery gives rise to the posterior descending     artery.  No angiographically significant disease is present.  One     acute marginal branch.  Left ventriculogram demonstrates normal left ventricular ejection fraction of 65% with no wall motion abnormality.  No significant mitral regurgitation.  Descending aorta is mildly tortuous but no evidence of aortic aneurysm.  IMPRESSION: 1. A 30% mid atrioventricular groove circumflex stenosis but     otherwise, no angiographically significant heart disease present. 2. Normal left ventricular  ejection fraction of 60% with no wall     motion abnormality. 3. No significant mitral regurgitation, no aortic stenosis present.     Hemodynamics demonstrate left ventricular systolic pressure of 400     with an end-diastolic pressure of 16 mmHg.  Aortic pressure 124/54     with a mean of 80 mmHg.  PLAN:  I will have her follow up in 1 week in clinic for postcath followup.  Overall, reassuring cardiac catheterization.  Continue with medical management.  Jerline Pain, MD    Laboratory Data:  High Sensitivity Troponin:   Recent Labs  Lab 02/14/21 1653 02/14/21 2128  TROPONINIHS 14 15     Chemistry Recent Labs  Lab 02/14/21 1652 02/15/21 0646  NA 127* 130*  K 3.9 3.1*  CL 90* 89*  CO2 23 28  GLUCOSE 178* 128*  BUN 25* 23  CREATININE 0.94 0.80  CALCIUM 9.3 8.9  GFRNONAA >60 >60  ANIONGAP 14 13    No results for input(s): PROT, ALBUMIN, AST, ALT, ALKPHOS, BILITOT in the last 168 hours. Hematology Recent Labs  Lab 02/14/21 1652  WBC 16.0*  RBC 4.45  HGB 14.2  HCT 42.5  MCV 95.5  MCH 31.9  MCHC 33.4  RDW 13.8  PLT 291   BNPNo results for input(s): BNP, PROBNP in the last 168 hours.  DDimer No results for input(s): DDIMER in the last 168 hours.   Radiology/Studies:  DG Chest 2 View  Result Date: 02/14/2021 CLINICAL DATA:  LEFT side chest pain radiating down LEFT arm with diaphoresis, 3 episodes in last 2 weeks, atrial fibrillation on EKG EXAM: CHEST - 2 VIEW COMPARISON:  07/28/2017 FINDINGS: Upper normal heart size. Mediastinal contours and pulmonary vascularity normal. Lungs clear. No pulmonary infiltrate, pleural effusion, or pneumothorax. Minimal biconvex thoracolumbar scoliosis.  IMPRESSION: No acute abnormalities. Electronically Signed   By: Lavonia Dana M.D.   On: 02/14/2021 16:41     Assessment and Plan:   1. Episodic chest pain (with h/o mild CAD) - mixed typical atypical features - occurs at rest but not exertion, but was associated with  significant diaphoresis - hsTroponins negative and reassuring - not clearly related to atrial fib as she remains in afib at same rate from presentation and is now asymptomatic - await echo - assuming LVEF is normal, we will plan for a Lexiscan nuclear stress tomorrow.  I placed order and spoke with nuc med so they are aware - if echo is abnormal, will need to consider holding apixaban and putting on heparin per pharmacy with consideration of cardiac cath. - continue ASA until nuc is back - continue BB - add statin and check lipid profile/LFTs - if the patient is tolerating statin at time of follow-up appointment, would consider rechecking liver function/lipid panel in 6-8 weeks  2. Newly recognized atrial fibrillation - unknown chronicity, last in sinus 2018 - rate controlled on home beta blocker atenolol, continue - she was started on apixaban for CHADSVASC tentatively 5 for HTN, age x 2, vascular disease (mild CAD) and female - discussed rationale for therapy and bleeding precautions with patient - will obtain A1C given hyperglycemia and await LVEF to complete CHADSVASC assessment - await echo/nuc as above - she has no present indication for acute cardioversion, so anticipate we'll follow in the outpatient setting and consider DCCV after at least 3 weeks of uninterrupted anticoagulation  3. HTN - follow in context above  4. NSVT (14 beats, no recurrent episodes) - ? Relationship to hypokalemia - IM treating this - Mg 1.9, will give 1g mag sulfate to keep Mg 2.0 or greater - continue BB and follow on telemetry - f/u echo - f/u labs in AM  5. Lab abnormalities including worsening hyponatremia, leukocytosis, hyponatremia, and abnormal TSH - HCTZ discontinued, may be cause of low Na/K - s/p replacement of K, follow - w/u of ?Na and ?WBC per IM - TSH suppressed but she is on Biotin as outpatient which can interfere with assay so would suggest repeating studies when off Biotin for a week.  If she is truly tipping towards hyperthyroid, this could be driving Afib   Risk Assessment/Risk Scores:     HEAR Score (for undifferentiated chest pain):  HEAR Score: 5   CHA2DS2-VASc Score = 5  This indicates a 7.2% annual risk of stroke. The patient's score is based upon: CHF History: No HTN History: Yes Diabetes History: No Stroke History: No Vascular Disease History: Yes Age Score: 2 Gender Score: 1       For questions or updates, please contact [place] Please consult [email or web address] for contact info under    Signed, Charlie Pitter, PA-C  02/15/2021 9:31 AM

## 2021-02-15 NOTE — H&P (Signed)
History and Physical    Tabitha Murphy WLN:[id number] DOB: 11/13/1941 DOA: 02/14/2021  PCP: [name], MD  Patient coming from: Home.  Chief Complaint: Chest pain.  HPI: Tabitha Murphy is a 80 y.o. female with history of hypertension, hypothyroidism presents to the ER after patient started experiencing chest pain.  Patient states over the last evening patient had 3 chest pain episode.  The first 2 were at home when patient was in a resting position.  Lasted for a few minutes and was left-sided anterior chest wall.  Pressure-like nonradiating with no shortness of breath.  Had some diaphoresis.  Yesterday when patient was at the [facility] patient started experiencing some chest pain which this time became more persistent.  Patient has also had diaphoresis.  Patient went to the primary care's office over there patient was found to be in A. fib but rate controlled advised to come to the ER.  ED Course: In the ER I sensitive troponin Covid test and EKG did not show any acute.  EKG did show A. fib.  Rate controlled.  Labs are significant for hyponatremia with sodium of 127.Marland Kitchen  Patient admitted for further management of chest pain and new onset A. fib.  Review of Systems: As per HPI, rest all negative.   Past Medical History:  Diagnosis Date  . Diverticulosis   . Hiatal hernia   . S/P laparoscopic cholecystectomy 07/28/2011   23 years ago  . Thyroid goiter   . Unspecified essential hypertension     Past Surgical History:  Procedure Laterality Date  . child birth     x3  . CHOLECYSTECTOMY       reports that she has never smoked. She has never used smokeless tobacco. She reports that she does not drink alcohol and does not use drugs.  Allergies  Allergen Reactions  . Codeine     REACTION: nausea  . Duloxetine Diarrhea  . Lyrica [Pregabalin] Other (See Comments)    Made pain in legs and feet worse.    Family History  Problem Relation Age of Onset  . Cervical cancer Mother   . CAD  Sister   . Colon cancer Neg Hx     Prior to Admission medications   Medication Sig Start Date End Date Taking? Authorizing Provider  atenolol (TENORMIN) 100 MG tablet Take 100 mg by mouth daily.   Yes [provider]  b complex vitamins tablet Take 1 tablet by mouth daily.   Yes [provider]  Biotin 1 MG CAPS Take 1 capsule by mouth daily.   Yes [provider]  calcium carbonate (OS-CAL) 600 MG TABS Take 600 mg by mouth 2 (two) times daily with a meal.   Yes [provider]  hydrochlorothiazide (HYDRODIURIL) 25 MG tablet Take 25 mg by mouth every morning. 12/07/20  Yes [provider]  HYDROcodone-acetaminophen (NORCO/VICODIN) 5-325 MG tablet Take 1 tablet by mouth every 6 (six) hours as needed for moderate pain.   Yes [provider]  irbesartan (AVAPRO) 300 MG tablet Take 300 mg by mouth every morning. 02/02/21  Yes [provider]  levothyroxine (SYNTHROID, LEVOTHROID) 50 MCG tablet Take 50 mcg by mouth daily.   Yes [provider]  Multiple Vitamin (MULTIVITAMIN) tablet Take 1 tablet by mouth daily.   Yes [provider]  omeprazole (PRILOSEC OTC) 20 MG tablet Take 20 mg by mouth daily.   Yes [provider]  predniSONE (DELTASONE) 20 MG tablet Take 20 mg  by mouth daily with breakfast. 02/06/21  Yes [provider]  promethazine (PHENERGAN) 25 MG tablet Take 25 mg by mouth every 6 (six) hours as needed for nausea or vomiting.   Yes [provider]  Selenium 100 MCG TABS Take 1 tablet by mouth daily.   Yes [provider]  vitamin C (ASCORBIC ACID) 500 MG tablet Take 500 mg by mouth daily.   Yes [provider]  vitamin E (VITAMIN E) 180 MG (400 UNITS) capsule Take 400 Units by mouth daily.   Yes [provider]  albuterol (VENTOLIN HFA) 108 (90 Base) MCG/ACT inhaler Inhale 2 puffs into the lungs every 4 (four) hours as needed for wheezing or shortness of  breath. 03/18/20   [provider]  ALPRAZolam Duanne Moron) 0.5 MG tablet Take 0.5 mg by mouth daily as needed for anxiety.    [provider]  cyclobenzaprine (FLEXERIL) 10 MG tablet Take 5-10 mg by mouth 3 (three) times daily as needed for muscle spasms. 01/12/21   [provider]  diclofenac Sodium (VOLTAREN) 1 % GEL APPLY 4 G TOPICALLY 3 (THREE) TIMES DAILY AS NEEDED. Patient not taking: Reported on 02/14/2021 05/24/20   Marcial Pacas, MD  lidocaine-prilocaine (EMLA) cream Apply 1 application topically 3 (three) times daily as needed. Patient taking differently: Apply 1 application topically 3 (three) times daily as needed (pain). 05/09/20   Marcial Pacas, MD  valACYclovir (VALTREX) 1000 MG tablet Take 1,000 mg by mouth daily as needed (fever blisters).    [provider]    Physical Exam: Constitutional: Moderately built and nourished. Vitals:   02/14/21 2100 02/14/21 2130 02/14/21 2200 02/14/21 2300  BP: (!) 138/94 (!) 142/78 (!) 147/89 (!) 155/103  Pulse: 68 84 82 90  Resp: (!) 26 18 15 18   Temp:      TempSrc:      SpO2: 96% 95% 93% 97%  Weight:      Height:       Eyes: Anicteric no pallor. ENMT: No discharge from the ears eyes nose or mouth. Neck: No mass felt.  No neck rigidity. Respiratory: No rhonchi or crepitations. Cardiovascular: S1-S2 heard. Abdomen:   Soft nontender bowel sounds present.                    Musculoskeletal: No edema.  Skin: No rash. Neurologic: Alert awake oriented to time place and person.  Moves all extremities. Psychiatric: Appears normal.  Normal affect.   Labs on Admission: I have personally reviewed following labs and imaging studies  CBC: Recent Labs  Lab 02/14/21 1652  WBC 16.0*  HGB 14.2  HCT 42.5  MCV 95.5  PLT 035   Basic Metabolic Panel: Recent Labs  Lab 02/14/21 1652  NA 127*  K 3.9  CL 90*  CO2 23  GLUCOSE 178*  BUN 25*  CREATININE 0.94  CALCIUM 9.3   GFR: Estimated Creatinine Clearance: 47.6  mL/min (by C-G formula based on SCr of 0.94 mg/dL). Liver Function Tests: No results for input(s): AST, ALT, ALKPHOS, BILITOT, PROT, ALBUMIN in the last 168 hours. No results for input(s): LIPASE, AMYLASE in the last 168 hours. No results for input(s): AMMONIA in the last 168 hours. Coagulation Profile: No results for input(s): INR, PROTIME in the last 168 hours. Cardiac Enzymes: No results for input(s): CKTOTAL, CKMB, CKMBINDEX, TROPONINI in the last 168 hours. BNP (last 3 results) No results for input(s): PROBNP in the last 8760 hours. HbA1C: No results for input(s): HGBA1C  in the last 72 hours. CBG: No results for input(s): GLUCAP in the last 168 hours. Lipid Profile: No results for input(s): CHOL, HDL, LDLCALC, TRIG, CHOLHDL, LDLDIRECT in the last 72 hours. Thyroid Function Tests: No results for input(s): TSH, T4TOTAL, FREET4, T3FREE, THYROIDAB in the last 72 hours. Anemia Panel: No results for input(s): VITAMINB12, FOLATE, FERRITIN, TIBC, IRON, RETICCTPCT in the last 72 hours. Urine analysis:    Component Value Date/Time   COLORURINE YELLOW 02/15/2010 0059   APPEARANCEUR CLEAR 02/15/2010 0059   LABSPEC 1.045 (H) 02/15/2010 0059   PHURINE 6.0 02/15/2010 0059   GLUCOSEU NEGATIVE 02/15/2010 0059   HGBUR NEGATIVE 02/15/2010 0059   BILIRUBINUR NEGATIVE 02/15/2010 0059   KETONESUR 15 (A) 02/15/2010 0059   PROTEINUR NEGATIVE 02/15/2010 0059   UROBILINOGEN 0.2 02/15/2010 0059   NITRITE NEGATIVE 02/15/2010 0059   LEUKOCYTESUR SMALL (A) 02/15/2010 0059   Sepsis Labs: @LABRCNTIP (procalcitonin:4,lacticidven:4) )No results found for this or any previous visit (from the past 240 hour(s)).   Radiological Exams on Admission: DG Chest 2 View  Result Date: 02/14/2021 CLINICAL DATA:  LEFT side chest pain radiating down LEFT arm with diaphoresis, 3 episodes in last 2 weeks, atrial fibrillation on EKG EXAM: CHEST - 2 VIEW COMPARISON:  07/28/2017 FINDINGS: Upper normal heart size.  Mediastinal contours and pulmonary vascularity normal. Lungs clear. No pulmonary infiltrate, pleural effusion, or pneumothorax. Minimal biconvex thoracolumbar scoliosis. IMPRESSION: No acute abnormalities. Electronically Signed   By: Lavonia Dana M.D.   On: 02/14/2021 16:41    EKG: Independently reviewed.  A. fib rate controlled.  Assessment/Plan Principal Problem:   Chest pain Active Problems:   Essential hypertension   Atrial fibrillation (HCC)    1. Chest pain -cause not clear.  Patient is presently chest pain-free.  Will cycle cardiac markers consult cardiology.  Aspirin.  Patient is also on apixaban. 2. New onset A. fib rate controlled on beta-blockers and apixaban has been started. 3. Hyponatremia we will hold hydrochlorothiazide gently hydrate follow metabolic panel check urine studies including urine sodium and osmolality. 4. Hypertension on ARB beta-blockers hold hydrochlorothiazide due to hyponatremia.   DVT prophylaxis: Eliquis. Code Status: Full code. Family Communication: Family at the bedside. Disposition Plan: Home. Consults called: Cardiology. Admission status: Observation.   Rise Patience MD [facility] Pager [phone].  If 7PM-7AM, please contact night-coverage [email or web address] Password [facility]  02/15/2021, 12:23 AM

## 2021-02-15 NOTE — Progress Notes (Signed)
[facility] PROGRESS NOTE   AMMI HUTT EYC:[id number] DOB: 1941-02-04 DOA: 02/14/2021  PCP: [name], MD  Brief History/Interval Summary: 80 y.o. female with history of hypertension, hypothyroidism presented to the ER after patient started experiencing chest pain.  She was found to be in atrial fibrillation.  She was hospitalized.  Cardiology was consulted.  Consultants: Cardiology  Procedures: Transthoracic echocardiogram is pending  Antibiotics: Anti-infectives (From admission, onward)   None      Subjective/Interval History: Patient denies any chest discomfort this morning.  Wants to know if she can go home.  Denies any shortness of breath.  No nausea or vomiting.    Assessment/Plan:  Chest pain Etiology is unclear.  EKG shows atrial fibrillation but no obvious ischemic changes were noted.  Echocardiogram is pending.  Patient appears to have ruled out for ACS by troponin.  Patient is on aspirin.  Patient was started on apixaban for atrial fibrillation.  Cardiology was consulted.  They waiting for echocardiogram report to decide further course of action.  New onset atrial fibrillation Currently rate controlled with beta-blockers.  Was started on apixaban in the emergency department.  Echocardiogram is pending.  TSH noted to be low at 0.16.  We will check free T4.  Patient does have a history of hypothyroidism and is noted to be on levothyroxine.  Hypokalemia and hyponatremia Sodium levels noted to be better compared to last night.  HCTZ on hold.  Potassium noted to be lower.  Will replete.  Essential hypertension Holding HCTZ.  Continue beta-blocker as well as ARB.  Monitor blood pressures closely.  Hypothyroidism Continue levothyroxine.  May need to adjust dose if free T4 is elevated.  Obesity Estimated body mass index is 32.26 kg/m as calculated from the following:   Height as of this encounter: 5\' 2"  (1.575 m).   Weight as of this encounter: 80  kg.  DVT Prophylaxis: Noted to be on apixaban Code Status: Full code Family Communication: Discussed with patient Disposition Plan: Hopefully return home when cardiac work-up has been completed  Status is: Inpatient  Remains inpatient appropriate because:Ongoing diagnostic testing needed not appropriate for outpatient work up   Dispo: The patient is from: Home              Anticipated d/c is to: Home              Patient currently is not medically stable to d/c.   Difficult to place patient No     Medications:  Scheduled: . apixaban  5 mg Oral BID  . aspirin EC  81 mg Oral Daily  . atenolol  100 mg Oral Daily  . atorvastatin  40 mg Oral QPM  . irbesartan  300 mg Oral q morning  . levothyroxine  50 mcg Oral Daily  . omeprazole  20 mg Oral Daily  . predniSONE  20 mg Oral Q breakfast   Continuous: . sodium chloride 50 mL/hr at 02/15/21 1497   WYO:VZCHYIFOYDXAJ **OR** acetaminophen, albuterol, ALPRAZolam, cyclobenzaprine   Objective:  Vital Signs  Vitals:   02/15/21 1115 02/15/21 1130 02/15/21 1145 02/15/21 1200  BP:    115/77  Pulse: 83 76 99 73  Resp: 19 20 18 18   Temp:      TempSrc:      SpO2: 97% 95% 95% 95%  Weight:      Height:       No intake or output data in the 24 hours ending 02/15/21 [place]  02/14/21 1611  Weight: 80 kg    General appearance: Awake alert.  In no distress Resp: Clear to auscultation bilaterally.  Normal effort Cardio: S1-S2 is irregularly irregular.  No S3-S4.  No rubs murmurs or bruit GI: Abdomen is soft.  Nontender nondistended.  Bowel sounds are present normal.  No masses organomegaly Extremities: No edema.  Full range of motion of lower extremities. Neurologic: Alert and oriented x3.  No focal neurological deficits.    Lab Results:  Data Reviewed: I have personally reviewed following labs and imaging studies  CBC: Recent Labs  Lab 02/14/21 1652  WBC 16.0*  HGB 14.2  HCT 42.5  MCV 95.5  PLT 291     Basic Metabolic Panel: Recent Labs  Lab 02/14/21 1652 02/15/21 0646 02/15/21 1000  NA 127* 130* 131*  K 3.9 3.1* 2.9*  CL 90* 89* 94*  CO2 23 28 24   GLUCOSE 178* 128* 87  BUN 25* 23 19  CREATININE 0.94 0.80 0.72  CALCIUM 9.3 8.9 8.7*  MG  --  1.9  --     GFR: Estimated Creatinine Clearance: 55.9 mL/min (by C-G formula based on SCr of 0.72 mg/dL).   Thyroid Function Tests: Recent Labs    02/14/21 1642  TSH 0.160*     Recent Results (from the past 240 hour(s))  Resp Panel by RT-PCR (Flu A&B, Covid) Nasopharyngeal Swab     Status: None   Collection Time: 02/14/21 11:32 PM   Specimen: Nasopharyngeal Swab; Nasopharyngeal(NP) swabs in vial transport medium  Result Value Ref Range Status   SARS Coronavirus 2 by RT PCR NEGATIVE NEGATIVE Final    Comment: (NOTE) SARS-CoV-2 target nucleic acids are NOT DETECTED.  The SARS-CoV-2 RNA is generally detectable in upper respiratory specimens during the acute phase of infection. The lowest concentration of SARS-CoV-2 viral copies this assay can detect is 138 copies/mL. A negative result does not preclude SARS-Cov-2 infection and should not be used as the sole basis for treatment or other patient management decisions. A negative result may occur with  improper specimen collection/handling, submission of specimen other than nasopharyngeal swab, presence of viral mutation(s) within the areas targeted by this assay, and inadequate number of viral copies(<138 copies/mL). A negative result must be combined with clinical observations, patient history, and epidemiological information. The expected result is Negative.  Fact Sheet for Patients:  EntrepreneurPulse.com.au  Fact Sheet for Healthcare Providers:  IncredibleEmployment.be  This test is no t yet approved or cleared by the [place] FDA and  has been authorized for detection and/or diagnosis of SARS-CoV-2 by FDA under an Emergency  Use Authorization (EUA). This EUA will remain  in effect (meaning this test can be used) for the duration of the COVID-19 declaration under Section 564(b)(1) of the Act, 21 U.S.C.section 360bbb-3(b)(1), unless the authorization is terminated  or revoked sooner.       Influenza A by PCR NEGATIVE NEGATIVE Final   Influenza B by PCR NEGATIVE NEGATIVE Final    Comment: (NOTE) The Xpert Xpress SARS-CoV-2/FLU/RSV plus assay is intended as an aid in the diagnosis of influenza from Nasopharyngeal swab specimens and should not be used as a sole basis for treatment. Nasal washings and aspirates are unacceptable for Xpert Xpress SARS-CoV-2/FLU/RSV testing.  Fact Sheet for Patients: EntrepreneurPulse.com.au  Fact Sheet for Healthcare Providers: IncredibleEmployment.be  This test is not yet approved or cleared by the [place] FDA and has been authorized for detection and/or diagnosis of SARS-CoV-2 by FDA under an Emergency Use Authorization (  EUA). This EUA will remain in effect (meaning this test can be used) for the duration of the COVID-19 declaration under Section 564(b)(1) of the Act, 21 U.S.C. section 360bbb-3(b)(1), unless the authorization is terminated or revoked.  Performed at [facility], [place] [address] [place], [place] [address]       Radiology Studies: DG Chest 2 View  Result Date: 02/14/2021 CLINICAL DATA:  LEFT side chest pain radiating down LEFT arm with diaphoresis, 3 episodes in last 2 weeks, atrial fibrillation on EKG EXAM: CHEST - 2 VIEW COMPARISON:  07/28/2017 FINDINGS: Upper normal heart size. Mediastinal contours and pulmonary vascularity normal. Lungs clear. No pulmonary infiltrate, pleural effusion, or pneumothorax. Minimal biconvex thoracolumbar scoliosis. IMPRESSION: No acute abnormalities. Electronically Signed   By: Lavonia Dana M.D.   On: 02/14/2021 16:41       LOS: 0 days   [facility] Pager on [email or web address]  02/15/2021, 12:30 PM

## 2021-02-15 NOTE — Progress Notes (Signed)
2D echo reviewed. LVEF mildly decreased EF 45-50%. Basal segments are hypokinetic with best preseved function in mid and apex. RV mildly reduced as well. Dr. Radford Pax is gone for the day so I discussed with Dr. Meda Coffee. She would still consider moving forward with nuclear test tomorrow and continuing Eliquis as planned. We will keep orders in as written, but Dr. Radford Pax is rounding tomorrow and can review and provide final recommendations. I spoke with the nuc med team to let them know to please await input from our weekend cardiology team about moving forward with stress test once they've had a chance to discuss with Dr. Radford Pax. Also notified nursing of the same. Sent message to our cardmaster inbox (checked by weekend day APP) to look into this early and communicate Dr. Theodosia Blender recommendation to nuc med team.   Melina Copa PA-C

## 2021-02-15 NOTE — ED Notes (Signed)
Pt sit to stand with 1 person assist to bedside commode. Pt had a BM and urinated. Unable to obtain urine sample due to stool contamination

## 2021-02-16 ENCOUNTER — Ambulatory Visit (HOSPITAL_COMMUNITY)
Admit: 2021-02-16 | Discharge: 2021-02-16 | Disposition: A | Discharge status: Home / Self Care | Payer: PPO | Attending: Physician Assistant | Admitting: Physician Assistant

## 2021-02-16 ENCOUNTER — Other Ambulatory Visit: Payer: PPO

## 2021-02-16 DIAGNOSIS — I4819 Other persistent atrial fibrillation: Secondary | ICD-10-CM

## 2021-02-16 DIAGNOSIS — E039 Hypothyroidism, unspecified: Secondary | ICD-10-CM

## 2021-02-16 DIAGNOSIS — R079 Chest pain, unspecified: Secondary | ICD-10-CM | POA: Insufficient documentation

## 2021-02-16 DIAGNOSIS — I10 Essential (primary) hypertension: Secondary | ICD-10-CM

## 2021-02-16 DIAGNOSIS — I420 Dilated cardiomyopathy: Secondary | ICD-10-CM

## 2021-02-16 DIAGNOSIS — I4891 Unspecified atrial fibrillation: Secondary | ICD-10-CM

## 2021-02-16 LAB — HEPATIC FUNCTION PANEL
ALT: 17 U/L (ref 0–44)
AST: 15 U/L (ref 15–41)
Albumin: 3.4 g/dL — ABNORMAL LOW (ref 3.5–5.0)
Alkaline Phosphatase: 61 U/L (ref 38–126)
Bilirubin, Direct: 0.1 mg/dL (ref 0.0–0.2)
Indirect Bilirubin: 0.9 mg/dL (ref 0.3–0.9)
Total Bilirubin: 1 mg/dL (ref 0.3–1.2)
Total Protein: 6 g/dL — ABNORMAL LOW (ref 6.5–8.1)

## 2021-02-16 LAB — BASIC METABOLIC PANEL
Anion gap: 10 (ref 5–15)
BUN: 19 mg/dL (ref 8–23)
CO2: 27 mmol/L (ref 22–32)
Calcium: 8.8 mg/dL — ABNORMAL LOW (ref 8.9–10.3)
Chloride: 96 mmol/L — ABNORMAL LOW (ref 98–111)
Creatinine, Ser: 0.76 mg/dL (ref 0.44–1.00)
GFR, Estimated: >60 mL/min (ref >60)
Glucose, Bld: 107 mg/dL — ABNORMAL HIGH (ref 70–99)
Potassium: 5 mmol/L (ref 3.5–5.1)
Sodium: 133 mmol/L — ABNORMAL LOW (ref 135–145)

## 2021-02-16 LAB — NM MYOCAR MULTI W/SPECT W/WALL MOTION / EF
Estimated workload: 1 METS
Exercise duration (min): 7 min
Exercise duration (sec): 18 s
MPHR: 141 {beats}/min
Peak HR: 101 {beats}/min
Percent HR: 71 %
Rest HR: 71 {beats}/min

## 2021-02-16 LAB — CBC
HCT: 36.9 % (ref 36.0–46.0)
Hemoglobin: 12.2 g/dL (ref 12.0–15.0)
MCH: 31.4 pg (ref 26.0–34.0)
MCHC: 33.1 g/dL (ref 30.0–36.0)
MCV: 95.1 fL (ref 80.0–100.0)
Platelets: 330 10*3/uL (ref 150–400)
RBC: 3.88 MIL/uL (ref 3.87–5.11)
RDW: 14 % (ref 11.5–15.5)
WBC: 11.1 10*3/uL — ABNORMAL HIGH (ref 4.0–10.5)
nRBC: 0 % (ref 0.0–0.2)

## 2021-02-16 LAB — LIPID PANEL
Cholesterol: 171 mg/dL (ref 0–200)
HDL: 45 mg/dL (ref >40)
LDL Cholesterol: 104 mg/dL — ABNORMAL HIGH (ref 0–99)
Total CHOL/HDL Ratio: 3.8 RATIO
Triglycerides: 111 mg/dL (ref <150)
VLDL: 22 mg/dL (ref 0–40)

## 2021-02-16 LAB — HEMOGLOBIN A1C
Hgb A1c MFr Bld: 6.4 % — ABNORMAL HIGH (ref 4.8–5.6)
Mean Plasma Glucose: 136.98 mg/dL

## 2021-02-16 LAB — MAGNESIUM: Magnesium: 2 mg/dL (ref 1.7–2.4)

## 2021-02-16 LAB — T4, FREE: Free T4: 1.33 ng/dL — ABNORMAL HIGH (ref 0.61–1.12)

## 2021-02-16 LAB — OSMOLALITY, URINE: Osmolality, Ur: 338 mOsm/kg (ref 300–900)

## 2021-02-16 MED ORDER — REGADENOSON 0.4 MG/5ML IV SOLN
INTRAVENOUS | Status: AC
  Filled 2021-02-16: qty 5

## 2021-02-16 MED ORDER — LEVOTHYROXINE SODIUM 25 MCG PO TABS
25.0000 ug | ORAL_TABLET | Freq: Every day | ORAL | Status: DC
  Administered 2021-02-17: 25 ug via ORAL
  Filled 2021-02-16: qty 1

## 2021-02-16 MED ORDER — TECHNETIUM TC 99M TETROFOSMIN IV KIT
10.8000 | PACK | Freq: Once | INTRAVENOUS | Status: AC | PRN
  Administered 2021-02-16: 10.8 via INTRAVENOUS

## 2021-02-16 MED ORDER — TECHNETIUM TC 99M TETROFOSMIN IV KIT
29.1000 | PACK | Freq: Once | INTRAVENOUS | Status: AC | PRN
  Administered 2021-02-16: 29.1 via INTRAVENOUS

## 2021-02-16 MED ORDER — REGADENOSON 0.4 MG/5ML IV SOLN
0.4000 mg | Freq: Once | INTRAVENOUS | Status: AC
  Administered 2021-02-16: 0.4 mg via INTRAVENOUS
  Filled 2021-02-16: qty 5

## 2021-02-16 MED ORDER — ACETAMINOPHEN 325 MG PO TABS
ORAL_TABLET | ORAL | Status: AC
  Filled 2021-02-16: qty 2

## 2021-02-16 NOTE — Progress Notes (Signed)
   [name] presented for a nuclear stress test today.  No immediate complications.  Stress imaging is pending at this time.  Preliminary EKG findings may be listed in the chart, but the stress test result will not be finalized until perfusion imaging is complete.  1 day study, [place] to read.  Rosaria Ferries, PA-C 02/16/2021, 12:58 PM

## 2021-02-16 NOTE — Progress Notes (Signed)
[facility] PROGRESS NOTE   Tabitha Murphy:[id number] DOB: March 27, 1941 DOA: 02/14/2021  PCP: [name], MD  Brief History/Interval Summary: 81 y.o. female with history of hypertension, hypothyroidism presented to the ER after patient started experiencing chest pain.  She was found to be in atrial fibrillation.  She was hospitalized.  Cardiology was consulted.  Consultants: Cardiology  Procedures:  Transthoracic echocardiogram 3/18 1. Left ventricular ejection fraction, by estimation, is 45 to 50%. The  left ventricle has mildly decreased function. Left ventricular endocardial  border not optimally defined to evaluate regional wall motion. Basal  segments are hypokinetic with best  preseved function in mid and apex. Left ventricular diastolic parameters  are indeterminate.  2. Right ventricular systolic function is mildly reduced. The right  ventricular size is normal. Tricuspid regurgitation signal is inadequate  for assessing PA pressure.  3. Left atrial size was mildly dilated.  4. Right atrial size was mildly dilated.  5. The mitral valve is normal in structure. Trivial mitral valve  regurgitation. No evidence of mitral stenosis.  6. The aortic valve is grossly normal. There is mild calcification of the  aortic valve. Aortic valve regurgitation is not visualized. No aortic  stenosis is present.  7. The inferior vena cava is normal in size with greater than 50%  respiratory variability, suggesting right atrial pressure of 3 mmHg.   Antibiotics: Anti-infectives (From admission, onward)   None      Subjective/Interval History: Patient denies any further episodes of chest pain.  Denies any palpitations.  No shortness of breath.  No nausea or vomiting.     Assessment/Plan:  Chest pain Etiology is unclear.  EKG shows atrial fibrillation but no obvious ischemic changes were noted.  Echocardiogram reveals diminished systolic function with hypokinetic  areas.  Cardiology was consulted.  Plan is for a nuclear stress test today.  Patient is on aspirin.    New onset atrial fibrillation Currently rate controlled with beta-blockers.  Was started on apixaban in the emergency department.  Echocardiogram is as above.  TSH noted to be low at 0.16.  Free T4 noted to be 1.33 which is slightly elevated.  Patient with history of hypothyroidism and on levothyroxine.  See below.  Chronic systolic CHF Echocardiogram shows EF to be 40 to 50%. This is a new diagnosis for her.  She appears to be euvolemic.  Hypokalemia and hyponatremia Sodium level continues to improve gradually.  Likely due to HCTZ which is on hold.  Potassium is better.  Magnesium 2.0.  Essential hypertension Holding HCTZ.  Continue beta-blocker as well as ARB.  Blood pressure is reasonably well controlled.  Hypothyroidism Patient's TSH was noted to be low at 0.16 and free T4 elevated at 1.33.  Will decrease the dose of her levothyroxine.  It looks like she was taking thyroxine.  We will decrease it to 25 micrograms daily.  Obesity Estimated body mass index is 32.37 kg/m as calculated from the following:   Height as of this encounter: 5\' 2"  (1.575 m).   Weight as of this encounter: 80.3 kg.  DVT Prophylaxis: Noted to be on apixaban Code Status: Full code Family Communication: Discussed with patient Disposition Plan: Hopefully return home when cardiac work-up has been completed  Status is: Inpatient  Remains inpatient appropriate because:Ongoing diagnostic testing needed not appropriate for outpatient work up   Dispo: The patient is from: Home              Anticipated d/c is to: Home  Patient currently is not medically stable to d/c.   Difficult to place patient No     Medications:  Scheduled: . apixaban  5 mg Oral BID  . aspirin EC  81 mg Oral Daily  . atenolol  100 mg Oral Daily  . atorvastatin  40 mg Oral QPM  . irbesartan  300 mg Oral q morning  .  levothyroxine  50 mcg Oral Daily  . omeprazole  20 mg Oral Daily  . predniSONE  20 mg Oral Q breakfast   Continuous:  LEX:[id number] **OR** acetaminophen, albuterol, ALPRAZolam, cyclobenzaprine   Objective:  Vital Signs  Vitals:   02/15/21 1537 02/15/21 2015 02/16/21 0002 02/16/21 0338  BP: 124/66 (!) 142/107 124/67 123/77  Pulse: 65 80 70 64  Resp: 18  18 20   Temp: 98.2 F (36.8 C) 98 F (36.7 C) 98 F (36.7 C) 97.8 F (36.6 C)  TempSrc: Oral Oral Oral Oral  SpO2: 98% 96% 97% 97%  Weight: 80.3 kg     Height: 5\' 2"  (1.575 m)       Intake/Output Summary (Last 24 hours) at 02/16/2021 1034 Last data filed at 02/16/2021 0359 Gross per 24 hour  Intake 1086.89 ml  Output --  Net 1086.89 ml   Filed Weights   02/14/21 1611 02/15/21 1537  Weight: 80 kg 80.3 kg    General appearance: Awake alert.  In no distress Resp: Clear to auscultation bilaterally.  Normal effort Cardio: S1-S2 is irregularly irregular.  No S3-S4.  No rubs murmurs or bruit GI: Abdomen is soft.  Nontender nondistended.  Bowel sounds are present normal.  No masses organomegaly Extremities: No edema.  Full range of motion of lower extremities. Neurologic: Alert and oriented x3.  No focal neurological deficits.    Lab Results:  Data Reviewed: I have personally reviewed following labs and imaging studies  CBC: Recent Labs  Lab 02/14/21 1652 02/16/21 0404  WBC 16.0* 11.1*  HGB 14.2 12.2  HCT 42.5 36.9  MCV 95.5 95.1  PLT 291 759    Basic Metabolic Panel: Recent Labs  Lab 02/14/21 1652 02/15/21 0646 02/15/21 1000 02/16/21 0404  NA 127* 130* 131* 133*  K 3.9 3.1* 2.9* 5.0  CL 90* 89* 94* 96*  CO2 23 28 24 27   GLUCOSE 178* 128* 87 107*  BUN 25* 23 19 19   CREATININE 0.94 0.80 0.72 0.76  CALCIUM 9.3 8.9 8.7* 8.8*  MG  --  1.9  --  2.0    GFR: Estimated Creatinine Clearance: 56 mL/min (by C-G formula based on SCr of 0.76 mg/dL).   Thyroid Function Tests: Recent Labs     02/14/21 1642 02/16/21 0404  TSH 0.160*  --   FREET4  --  1.33*     Recent Results (from the past 240 hour(s))  Resp Panel by RT-PCR (Flu A&B, Covid) Nasopharyngeal Swab     Status: None   Collection Time: 02/14/21 11:32 PM   Specimen: Nasopharyngeal Swab; Nasopharyngeal(NP) swabs in vial transport medium  Result Value Ref Range Status   SARS Coronavirus 2 by RT PCR NEGATIVE NEGATIVE Final    Comment: (NOTE) SARS-CoV-2 target nucleic acids are NOT DETECTED.  The SARS-CoV-2 RNA is generally detectable in upper respiratory specimens during the acute phase of infection. The lowest concentration of SARS-CoV-2 viral copies this assay can detect is 138 copies/mL. A negative result does not preclude SARS-Cov-2 infection and should not be used as the sole basis for treatment or other patient management decisions. A negative  result may occur with  improper specimen collection/handling, submission of specimen other than nasopharyngeal swab, presence of viral mutation(s) within the areas targeted by this assay, and inadequate number of viral copies(<138 copies/mL). A negative result must be combined with clinical observations, patient history, and epidemiological information. The expected result is Negative.  Fact Sheet for Patients:  EntrepreneurPulse.com.au  Fact Sheet for Healthcare Providers:  IncredibleEmployment.be  This test is no t yet approved or cleared by the [place] FDA and  has been authorized for detection and/or diagnosis of SARS-CoV-2 by FDA under an Emergency Use Authorization (EUA). This EUA will remain  in effect (meaning this test can be used) for the duration of the COVID-19 declaration under Section 564(b)(1) of the Act, 21 U.S.C.section 360bbb-3(b)(1), unless the authorization is terminated  or revoked sooner.       Influenza A by PCR NEGATIVE NEGATIVE Final   Influenza B by PCR NEGATIVE NEGATIVE Final    Comment:  (NOTE) The Xpert Xpress SARS-CoV-2/FLU/RSV plus assay is intended as an aid in the diagnosis of influenza from Nasopharyngeal swab specimens and should not be used as a sole basis for treatment. Nasal washings and aspirates are unacceptable for Xpert Xpress SARS-CoV-2/FLU/RSV testing.  Fact Sheet for Patients: EntrepreneurPulse.com.au  Fact Sheet for Healthcare Providers: IncredibleEmployment.be  This test is not yet approved or cleared by the [place] FDA and has been authorized for detection and/or diagnosis of SARS-CoV-2 by FDA under an Emergency Use Authorization (EUA). This EUA will remain in effect (meaning this test can be used) for the duration of the COVID-19 declaration under Section 564(b)(1) of the Act, 21 U.S.C. section 360bbb-3(b)(1), unless the authorization is terminated or revoked.  Performed at [facility], [place] [address], [place], [place] [address]       Radiology Studies: DG Chest 2 View  Result Date: 02/14/2021 CLINICAL DATA:  LEFT side chest pain radiating down LEFT arm with diaphoresis, 3 episodes in last 2 weeks, atrial fibrillation on EKG EXAM: CHEST - 2 VIEW COMPARISON:  07/28/2017 FINDINGS: Upper normal heart size. Mediastinal contours and pulmonary vascularity normal. Lungs clear. No pulmonary infiltrate, pleural effusion, or pneumothorax. Minimal biconvex thoracolumbar scoliosis. IMPRESSION: No acute abnormalities. Electronically Signed   By: Lavonia Dana M.D.   On: 02/14/2021 16:41   ECHOCARDIOGRAM COMPLETE  Result Date: 02/15/2021    ECHOCARDIOGRAM REPORT   Patient Name:   Tabitha Murphy Date of Exam: 02/15/2021 Medical Rec #:  [id number]      Height:       62.0 in Accession #:    [id number]     Weight:       176.4 lb Date of Birth:  [birth year]      BSA:          1.812 m Patient Age:    36 years       BP:           133/83 mmHg Patient Gender: F              HR:           78 bpm. Exam Location:   Inpatient Procedure: 2D Echo, Cardiac Doppler and Color Doppler Indications:    R07.9* Chest pain, unspecified  History:        Patient has no prior history of Echocardiogram examinations.                 Risk Factors:Hypertension.  Sonographer:    Jonelle Sidle Dance Referring Phys: [phone]  ARSHAD N KAKRAKANDY IMPRESSIONS  1. Left ventricular ejection fraction, by estimation, is 45 to 50%. The left ventricle has mildly decreased function. Left ventricular endocardial border not optimally defined to evaluate regional wall motion. Basal segments are hypokinetic with best preseved function in mid and apex. Left ventricular diastolic parameters are indeterminate.  2. Right ventricular systolic function is mildly reduced. The right ventricular size is normal. Tricuspid regurgitation signal is inadequate for assessing PA pressure.  3. Left atrial size was mildly dilated.  4. Right atrial size was mildly dilated.  5. The mitral valve is normal in structure. Trivial mitral valve regurgitation. No evidence of mitral stenosis.  6. The aortic valve is grossly normal. There is mild calcification of the aortic valve. Aortic valve regurgitation is not visualized. No aortic stenosis is present.  7. The inferior vena cava is normal in size with greater than 50% respiratory variability, suggesting right atrial pressure of 3 mmHg. FINDINGS  Left Ventricle: Left ventricular ejection fraction, by estimation, is 45 to 50%. The left ventricle has mildly decreased function. Left ventricular endocardial border not optimally defined to evaluate regional wall motion. The left ventricular internal cavity size was normal in size. There is no left ventricular hypertrophy. Left ventricular diastolic parameters are indeterminate. Right Ventricle: The right ventricular size is normal. No increase in right ventricular wall thickness. Right ventricular systolic function is mildly reduced. Tricuspid regurgitation signal is inadequate for assessing PA  pressure. Left Atrium: Left atrial size was mildly dilated. Right Atrium: Right atrial size was mildly dilated. Pericardium: There is no evidence of pericardial effusion. Presence of pericardial fat pad. Mitral Valve: The mitral valve is normal in structure. Trivial mitral valve regurgitation. No evidence of mitral valve stenosis. Tricuspid Valve: The tricuspid valve is normal in structure. Tricuspid valve regurgitation is trivial. No evidence of tricuspid stenosis. Aortic Valve: The aortic valve is grossly normal. There is mild calcification of the aortic valve. Aortic valve regurgitation is not visualized. No aortic stenosis is present. Pulmonic Valve: The pulmonic valve was grossly normal. Pulmonic valve regurgitation is trivial. No evidence of pulmonic stenosis. Aorta: The aortic root is normal in size and structure. Venous: The inferior vena cava is normal in size with greater than 50% respiratory variability, suggesting right atrial pressure of 3 mmHg. IAS/Shunts: No atrial level shunt detected by color flow Doppler.  LEFT VENTRICLE PLAX 2D LVIDd:         4.30 cm LVIDs:         3.30 cm LV PW:         1.00 cm LV IVS:        0.80 cm LVOT diam:     1.90 cm LV SV:         40 LV SV Index:   22 LVOT Area:     2.84 cm  RIGHT VENTRICLE          IVC RV Basal diam:  2.70 cm  IVC diam: 2.00 cm TAPSE (M-mode): 1.6 cm LEFT ATRIUM             Index       RIGHT ATRIUM           Index LA diam:        3.20 cm 1.77 cm/m  RA Area:     12.40 cm LA Vol (A2C):   38.4 ml 21.19 ml/m RA Volume:   25.90 ml  14.29 ml/m LA Vol (A4C):   54.3 ml 29.96 ml/m LA Biplane Vol: 50.6  ml 27.92 ml/m  AORTIC VALVE LVOT Vmax:   81.40 cm/s LVOT Vmean:  55.250 cm/s LVOT VTI:    0.142 m  AORTA Ao Asc diam: 3.50 cm MITRAL VALVE MV Area (PHT): 3.24 cm    SHUNTS MV Decel Time: 234 msec    Systemic VTI:  0.14 m MV E velocity: 94.75 cm/s  Systemic Diam: 1.90 cm Cherlynn Kaiser MD Electronically signed by Cherlynn Kaiser MD Signature Date/Time:  02/15/2021/3:39:55 PM    Final        LOS: 1 day   [facility] Pager on [email or web address]  02/16/2021, 10:34 AM

## 2021-02-16 NOTE — Progress Notes (Addendum)
Progress Note  Patient Name: Tabitha Murphy Date of Encounter: 02/16/2021  [facility] Cardiologist: Fransico Him, MD   Subjective   Denies any chest pain today  Inpatient Medications    Scheduled Meds: . apixaban  5 mg Oral BID  . aspirin EC  81 mg Oral Daily  . atenolol  100 mg Oral Daily  . atorvastatin  40 mg Oral QPM  . irbesartan  300 mg Oral q morning  . levothyroxine  50 mcg Oral Daily  . omeprazole  20 mg Oral Daily  . predniSONE  20 mg Oral Q breakfast   Continuous Infusions:  PRN Meds: acetaminophen **OR** acetaminophen, albuterol, ALPRAZolam, cyclobenzaprine   Vital Signs    Vitals:   02/15/21 1537 02/15/21 2015 02/16/21 0002 02/16/21 0338  BP: 124/66 (!) 142/107 124/67 123/77  Pulse: 65 80 70 64  Resp: 18  18 20   Temp: 98.2 F (36.8 C) 98 F (36.7 C) 98 F (36.7 C) 97.8 F (36.6 C)  TempSrc: Oral Oral Oral Oral  SpO2: 98% 96% 97% 97%  Weight: 80.3 kg     Height: 5\' 2"  (1.575 m)       Intake/Output Summary (Last 24 hours) at 02/16/2021 0943 Last data filed at 02/16/2021 0359 Gross per 24 hour  Intake 1086.89 ml  Output --  Net 1086.89 ml   Last 3 Weights 02/15/2021 02/14/2021 04/09/2020  Weight (lbs) 177 lb 176 lb 5.9 oz 177 lb  Weight (kg) 80.287 kg 80 kg 80.287 kg      Telemetry    NSR - Personally Reviewed  ECG    No new EKG to review - Personally Reviewed  Physical Exam   GEN: No acute distress.   Neck: No JVD Cardiac: RRR, no murmurs, rubs, or gallops.  Respiratory: Clear to auscultation bilaterally. GI: Soft, nontender, non-distended  MS: No edema; No deformity. Neuro:  Nonfocal  Psych: Normal affect   Labs    High Sensitivity Troponin:   Recent Labs  Lab 02/14/21 1653 02/14/21 2128  TROPONINIHS 14 15      Chemistry Recent Labs  Lab 02/15/21 0646 02/15/21 1000 02/16/21 0404  NA 130* 131* 133*  K 3.1* 2.9* 5.0  CL 89* 94* 96*  CO2 28 24 27   GLUCOSE 128* 87 107*  BUN 23 19 19   CREATININE 0.80 0.72 0.76   CALCIUM 8.9 8.7* 8.8*  PROT  --   --  6.0*  ALBUMIN  --   --  3.4*  AST  --   --  15  ALT  --   --  17  ALKPHOS  --   --  61  BILITOT  --   --  1.0  GFRNONAA >60 >60 >60  ANIONGAP 13 13 10      Hematology Recent Labs  Lab 02/14/21 1652 02/16/21 0404  WBC 16.0* 11.1*  RBC 4.45 3.88  HGB 14.2 12.2  HCT 42.5 36.9  MCV 95.5 95.1  MCH 31.9 31.4  MCHC 33.4 33.1  RDW 13.8 14.0  PLT 291 330    BNPNo results for input(s): BNP, PROBNP in the last 168 hours.   DDimer No results for input(s): DDIMER in the last 168 hours.   CHA2DS2-VASc Score = 5  This indicates a 7.2% annual risk of stroke. The patient's score is based upon: CHF History: No HTN History: Yes Diabetes History: No Stroke History: No Vascular Disease History: Yes Age Score: 2 Gender Score: 1      Radiology  DG Chest 2 View  Result Date: 02/14/2021 CLINICAL DATA:  LEFT side chest pain radiating down LEFT arm with diaphoresis, 3 episodes in last 2 weeks, atrial fibrillation on EKG EXAM: CHEST - 2 VIEW COMPARISON:  07/28/2017 FINDINGS: Upper normal heart size. Mediastinal contours and pulmonary vascularity normal. Lungs clear. No pulmonary infiltrate, pleural effusion, or pneumothorax. Minimal biconvex thoracolumbar scoliosis. IMPRESSION: No acute abnormalities. Electronically Signed   By: Lavonia Dana M.D.   On: 02/14/2021 16:41   ECHOCARDIOGRAM COMPLETE  Result Date: 02/15/2021    ECHOCARDIOGRAM REPORT   Patient Name:   Tabitha Murphy Date of Exam: 02/15/2021 Medical Rec #:  [id number]      Height:       62.0 in Accession #:    [id number]     Weight:       176.4 lb Date of Birth:  1941-07-02      BSA:          1.812 m Patient Age:    66 years       BP:           133/83 mmHg Patient Gender: F              HR:           78 bpm. Exam Location:  Inpatient Procedure: 2D Echo, Cardiac Doppler and Color Doppler Indications:    R07.9* Chest pain, unspecified  History:        Patient has no prior history of  Echocardiogram examinations.                 Risk Factors:Hypertension.  Sonographer:    Jonelle Sidle Dance Referring Phys: [place]  1. Left ventricular ejection fraction, by estimation, is 45 to 50%. The left ventricle has mildly decreased function. Left ventricular endocardial border not optimally defined to evaluate regional wall motion. Basal segments are hypokinetic with best preseved function in mid and apex. Left ventricular diastolic parameters are indeterminate.  2. Right ventricular systolic function is mildly reduced. The right ventricular size is normal. Tricuspid regurgitation signal is inadequate for assessing PA pressure.  3. Left atrial size was mildly dilated.  4. Right atrial size was mildly dilated.  5. The mitral valve is normal in structure. Trivial mitral valve regurgitation. No evidence of mitral stenosis.  6. The aortic valve is grossly normal. There is mild calcification of the aortic valve. Aortic valve regurgitation is not visualized. No aortic stenosis is present.  7. The inferior vena cava is normal in size with greater than 50% respiratory variability, suggesting right atrial pressure of 3 mmHg. FINDINGS  Left Ventricle: Left ventricular ejection fraction, by estimation, is 45 to 50%. The left ventricle has mildly decreased function. Left ventricular endocardial border not optimally defined to evaluate regional wall motion. The left ventricular internal cavity size was normal in size. There is no left ventricular hypertrophy. Left ventricular diastolic parameters are indeterminate. Right Ventricle: The right ventricular size is normal. No increase in right ventricular wall thickness. Right ventricular systolic function is mildly reduced. Tricuspid regurgitation signal is inadequate for assessing PA pressure. Left Atrium: Left atrial size was mildly dilated. Right Atrium: Right atrial size was mildly dilated. Pericardium: There is no evidence of pericardial  effusion. Presence of pericardial fat pad. Mitral Valve: The mitral valve is normal in structure. Trivial mitral valve regurgitation. No evidence of mitral valve stenosis. Tricuspid Valve: The tricuspid valve is normal in structure. Tricuspid valve regurgitation is trivial. No evidence  of tricuspid stenosis. Aortic Valve: The aortic valve is grossly normal. There is mild calcification of the aortic valve. Aortic valve regurgitation is not visualized. No aortic stenosis is present. Pulmonic Valve: The pulmonic valve was grossly normal. Pulmonic valve regurgitation is trivial. No evidence of pulmonic stenosis. Aorta: The aortic root is normal in size and structure. Venous: The inferior vena cava is normal in size with greater than 50% respiratory variability, suggesting right atrial pressure of 3 mmHg. IAS/Shunts: No atrial level shunt detected by color flow Doppler.  LEFT VENTRICLE PLAX 2D LVIDd:         4.30 cm LVIDs:         3.30 cm LV PW:         1.00 cm LV IVS:        0.80 cm LVOT diam:     1.90 cm LV SV:         40 LV SV Index:   22 LVOT Area:     2.84 cm  RIGHT VENTRICLE          IVC RV Basal diam:  2.70 cm  IVC diam: 2.00 cm TAPSE (M-mode): 1.6 cm LEFT ATRIUM             Index       RIGHT ATRIUM           Index LA diam:        3.20 cm 1.77 cm/m  RA Area:     12.40 cm LA Vol (A2C):   38.4 ml 21.19 ml/m RA Volume:   25.90 ml  14.29 ml/m LA Vol (A4C):   54.3 ml 29.96 ml/m LA Biplane Vol: 50.6 ml 27.92 ml/m  AORTIC VALVE LVOT Vmax:   81.40 cm/s LVOT Vmean:  55.250 cm/s LVOT VTI:    0.142 m  AORTA Ao Asc diam: 3.50 cm MITRAL VALVE MV Area (PHT): 3.24 cm    SHUNTS MV Decel Time: 234 msec    Systemic VTI:  0.14 m MV E velocity: 94.75 cm/s  Systemic Diam: 1.90 cm Cherlynn Kaiser MD Electronically signed by Cherlynn Kaiser MD Signature Date/Time: 02/15/2021/3:39:55 PM    Final     Cardiac Studies   2D echo 01/2021 IMPRESSIONS   1. Left ventricular ejection fraction, by estimation, is 45 to 50%. The   left ventricle has mildly decreased function. Left ventricular endocardial  border not optimally defined to evaluate regional wall motion. Basal  segments are hypokinetic with best  preseved function in mid and apex. Left ventricular diastolic parameters  are indeterminate.  2. Right ventricular systolic function is mildly reduced. The right  ventricular size is normal. Tricuspid regurgitation signal is inadequate  for assessing PA pressure.  3. Left atrial size was mildly dilated.  4. Right atrial size was mildly dilated.  5. The mitral valve is normal in structure. Trivial mitral valve  regurgitation. No evidence of mitral stenosis.  6. The aortic valve is grossly normal. There is mild calcification of the  aortic valve. Aortic valve regurgitation is not visualized. No aortic  stenosis is present.  7. The inferior vena cava is normal in size with greater than 50%  respiratory variability, suggesting right atrial pressure of 3 mmHg.   Patient Profile     80 y.o. female with a hx of mild CAD by cath 2012, HTN, hypothyroidism, hiatal hernia, diverticulosis, mild hyponatremia by prior labs, pre-DM (A1C 6 in 03/2020), chronic back issues who is being seen  for the evaluation of chest  pain/afib at the request of Dr. Hal Hope.  Assessment & Plan    1. Episodic chest pain (with h/o mild CAD) - mixed typical atypical features - occurs at rest but not exertion, but was associated with significant diaphoresis - hsTroponins negative and reassuring - not clearly related to atrial fib as she remains in afib at same rate from presentation and is now asymptomatic - 2D echo with mild LV dysfunction EF 45-50% with basal segments HK - she is NPO and will proceed with  Lexiscan nuclear stress test today. - continue ASA until nuc is back - continue BB and statin - repeat FLP and ALT in 6 weeks  2. Newly recognized atrial fibrillation - unknown chronicity, last in sinus 2018 - HR is well  controlled on tele - she was started on apixaban for CHADSVASC tentatively 5 for HTN, age x 2, vascular disease (mild CAD) and female - discussed rationale for therapy and bleeding precautions with patient - 2D echo with mildly reduced LVF with EF 45-50% - she has no present indication for acute cardioversion, so anticipate we'll follow in the outpatient setting and consider DCCV after at least 3 weeks of uninterrupted anticoagulation  3. HTN - BP well controlled - continue Irbesartan 300mg  daily and Atenolol 100mg  daily  4. NSVT (14 beats, no recurrent episodes) - ? Relationship to hypokalemia (K+ 2.9) - Mg 1.9>>repleted and 2 today - K+ repleated and 5 today - continue BB and follow on telemetry - EF on echo mildly reduced at 45-50%  5. Lab abnormalities including worsening hyponatremia, leukocytosis, hypokalemia and abnormal TSH - HCTZ discontinued, may be cause of low Na/K - s/p replacement of K, follow - w/u of ?Na and ?WBC per IM - TSH suppressed but she is on Biotin as outpatient which can interfere with assay so would suggest repeating studies when off Biotin for a week. If she is truly tipping towards hyperthyroid, this could be driving Afib      For questions or updates, please contact [place] Please consult [email or web address] for contact info under        Signed, Fransico Him, MD  02/16/2021, 9:43 AM

## 2021-02-17 LAB — CBC
HCT: 35 % — ABNORMAL LOW (ref 36.0–46.0)
Hemoglobin: 11.6 g/dL — ABNORMAL LOW (ref 12.0–15.0)
MCH: 31.6 pg (ref 26.0–34.0)
MCHC: 33.1 g/dL (ref 30.0–36.0)
MCV: 95.4 fL (ref 80.0–100.0)
Platelets: 301 10*3/uL (ref 150–400)
RBC: 3.67 MIL/uL — ABNORMAL LOW (ref 3.87–5.11)
RDW: 14 % (ref 11.5–15.5)
WBC: 11.6 10*3/uL — ABNORMAL HIGH (ref 4.0–10.5)
nRBC: 0 % (ref 0.0–0.2)

## 2021-02-17 LAB — BASIC METABOLIC PANEL
Anion gap: 10 (ref 5–15)
BUN: 21 mg/dL (ref 8–23)
CO2: 26 mmol/L (ref 22–32)
Calcium: 8.6 mg/dL — ABNORMAL LOW (ref 8.9–10.3)
Chloride: 97 mmol/L — ABNORMAL LOW (ref 98–111)
Creatinine, Ser: 0.74 mg/dL (ref 0.44–1.00)
GFR, Estimated: >60 mL/min (ref >60)
Glucose, Bld: 132 mg/dL — ABNORMAL HIGH (ref 70–99)
Potassium: 3.9 mmol/L (ref 3.5–5.1)
Sodium: 133 mmol/L — ABNORMAL LOW (ref 135–145)

## 2021-02-17 MED ORDER — LEVOTHYROXINE SODIUM 50 MCG PO TABS: 50.0000 ug | ORAL_TABLET | Freq: Every day | ORAL | 1 refills | Status: DC

## 2021-02-17 MED ORDER — ATORVASTATIN CALCIUM 40 MG PO TABS: 40.0000 mg | ORAL_TABLET | Freq: Every evening | ORAL | 1 refills | Status: DC

## 2021-02-17 MED ORDER — APIXABAN 5 MG PO TABS: 5.0000 mg | ORAL_TABLET | Freq: Two times a day (BID) | ORAL | 2 refills | Status: DC

## 2021-02-17 NOTE — Discharge Summary (Signed)
[facility]  Physician Discharge Summary   Patient ID: Tabitha Murphy MRN: [id number] DOB/AGE: 1941-09-05 80 y.o.  Admit date: 02/14/2021 Discharge date: 02/18/2021  PCP: [name], MD  DISCHARGE DIAGNOSES:  Chest pain, likely due to atrial fibrillation, resolved New onset atrial fibrillation Chronic systolic CHF Essential hypertension Hypothyroidism with dose adjustment made due to abnormal thyroid function tests  RECOMMENDATIONS FOR OUTPATIENT FOLLOW UP: 1. Please note that the dose of the levothyroxine has been decreased.  Please recheck thyroid function tests in 3 to 4 weeks. 2. Cardiology to arrange outpatient follow-up    Home Health: None Equipment/Devices: None  CODE STATUS: Full code  DISCHARGE CONDITION: fair  Diet recommendation: Heart healthy  INITIAL HISTORY: 80 y.o.femalewithhistory of hypertension, hypothyroidism presented to the ER after patient started experiencing chest pain.  She was found to be in atrial fibrillation.  She was hospitalized.  Cardiology was consulted.  Consultants: Cardiology  Procedures:  Transthoracic echocardiogram 3/18 1. Left ventricular ejection fraction, by estimation, is 45 to 50%. The  left ventricle has mildly decreased function. Left ventricular endocardial  border not optimally defined to evaluate regional wall motion. Basal  segments are hypokinetic with best  preseved function in mid and apex. Left ventricular diastolic parameters  are indeterminate.  2. Right ventricular systolic function is mildly reduced. The right  ventricular size is normal. Tricuspid regurgitation signal is inadequate  for assessing PA pressure.  3. Left atrial size was mildly dilated.  4. Right atrial size was mildly dilated.  5. The mitral valve is normal in structure. Trivial mitral valve  regurgitation. No evidence of mitral stenosis.  6. The aortic valve is grossly normal. There is mild calcification of the   aortic valve. Aortic valve regurgitation is not visualized. No aortic  stenosis is present.  7. The inferior vena cava is normal in size with greater than 50%  respiratory variability, suggesting right atrial pressure of 3 mmHg.   Nuclear stress test IMPRESSION: 1. No reversible ischemia. Small region of scarring in the anterior apex. Reduced inferior wall activity on stress and rest images favors diaphragmatic attenuation.  2. Normal left ventricular wall motion.  3. Left ventricular ejection fraction 61%  4. Non invasive risk stratification*: Low    HOSPITAL COURSE:   Chest pain Patient presented with chest pain.  EKG showed atrial fibrillation.  No ischemic changes were noted.  Echocardiogram revealed diminished systolic function with hypokinetic areas.  Cardiology was consulted.  Patient underwent nuclear stress test which was low risk study.  Seen by Dr. This morning and cleared for discharge.  Patient does not have any chest pain anymore.  Outpatient follow-up with cardiology.  No need for aspirin as patient is anticoagulated with apixaban   New onset atrial fibrillation Currently rate controlled with beta-blockers.    Echocardiogram and nuclear stress test results as above.  Heart rate remains well controlled.  Started on apixaban which can be continued.   TSH noted to be low at 0.16.  Free T4 noted to be 1.33 which is slightly elevated.  Patient with history of hypothyroidism and on levothyroxine.  See below.  Chronic systolic CHF Echocardiogram shows EF to be 40 to 50%. This is a new diagnosis for her.  She appears to be euvolemic.  Hypokalemia and hyponatremia Sodium level continues to improve gradually.  Likely due to HCTZ which is on hold.  Potassium is better.  Magnesium 2.0.  Essential hypertension Resuming ARB and beta-blocker.  Continue to hold HCTZ.  Hypothyroidism  Patient's TSH was noted to be low at 0.16 and free T4 elevated at 1.33.    Dose of  levothyroxine decreased to 25 mcg daily.  Outpatient follow-up and recheck thyroid function tests in a few weeks.  Obesity Estimated body mass index is 32.37 kg/m as calculated from the following:   Height as of this encounter: 5\' 2"  (1.575 m).   Weight as of this encounter: 80.3 kg.   Patient is stable.  Discussed with patient and daughter.  Discussed with Dr. Radford Pax.  Okay for discharge home today.   PERTINENT LABS:  The results of significant diagnostics from this hospitalization (including imaging, microbiology, ancillary and laboratory) are listed below for reference.    Microbiology: Recent Results (from the past 240 hour(s))  Resp Panel by RT-PCR (Flu A&B, Covid) Nasopharyngeal Swab     Status: None   Collection Time: 02/14/21 11:32 PM   Specimen: Nasopharyngeal Swab; Nasopharyngeal(NP) swabs in vial transport medium  Result Value Ref Range Status   SARS Coronavirus 2 by RT PCR NEGATIVE NEGATIVE Final    Comment: (NOTE) SARS-CoV-2 target nucleic acids are NOT DETECTED.  The SARS-CoV-2 RNA is generally detectable in upper respiratory specimens during the acute phase of infection. The lowest concentration of SARS-CoV-2 viral copies this assay can detect is 138 copies/mL. A negative result does not preclude SARS-Cov-2 infection and should not be used as the sole basis for treatment or other patient management decisions. A negative result may occur with  improper specimen collection/handling, submission of specimen other than nasopharyngeal swab, presence of viral mutation(s) within the areas targeted by this assay, and inadequate number of viral copies(<138 copies/mL). A negative result must be combined with clinical observations, patient history, and epidemiological information. The expected result is Negative.  Fact Sheet for Patients:  EntrepreneurPulse.com.au  Fact Sheet for Healthcare Providers:  IncredibleEmployment.be  This  test is no t yet approved or cleared by the [place] FDA and  has been authorized for detection and/or diagnosis of SARS-CoV-2 by FDA under an Emergency Use Authorization (EUA). This EUA will remain  in effect (meaning this test can be used) for the duration of the COVID-19 declaration under Section 564(b)(1) of the Act, 21 U.S.C.section 360bbb-3(b)(1), unless the authorization is terminated  or revoked sooner.       Influenza A by PCR NEGATIVE NEGATIVE Final   Influenza B by PCR NEGATIVE NEGATIVE Final    Comment: (NOTE) The Xpert Xpress SARS-CoV-2/FLU/RSV plus assay is intended as an aid in the diagnosis of influenza from Nasopharyngeal swab specimens and should not be used as a sole basis for treatment. Nasal washings and aspirates are unacceptable for Xpert Xpress SARS-CoV-2/FLU/RSV testing.  Fact Sheet for Patients: EntrepreneurPulse.com.au  Fact Sheet for Healthcare Providers: IncredibleEmployment.be  This test is not yet approved or cleared by the [place] FDA and has been authorized for detection and/or diagnosis of SARS-CoV-2 by FDA under an Emergency Use Authorization (EUA). This EUA will remain in effect (meaning this test can be used) for the duration of the COVID-19 declaration under Section 564(b)(1) of the Act, 21 U.S.C. section 360bbb-3(b)(1), unless the authorization is terminated or revoked.  Performed at [facility], [place] [address], [place], [place] [address]      Labs:  COVID-19 Labs   Lab Results  Component Value Date   [place] NEGATIVE 02/14/2021      Basic Metabolic Panel: Recent Labs  Lab 02/14/21 1652 02/15/21 0646 02/15/21 1000 02/16/21 0404 02/17/21 0401  NA  127* 130* 131* 133* 133*  K 3.9 3.1* 2.9* 5.0 3.9  CL 90* 89* 94* 96* 97*  CO2 23 28 24 27 26   GLUCOSE 178* 128* 87 107* 132*  BUN 25* 23 19 19 21   CREATININE 0.94 0.80 0.72 0.76 0.74  CALCIUM 9.3 8.9  8.7* 8.8* 8.6*  MG  --  1.9  --  2.0  --    Liver Function Tests: Recent Labs  Lab 02/16/21 0404  AST 15  ALT 17  ALKPHOS 61  BILITOT 1.0  PROT 6.0*  ALBUMIN 3.4*   CBC: Recent Labs  Lab 02/14/21 1652 02/16/21 0404 02/17/21 0401  WBC 16.0* 11.1* 11.6*  HGB 14.2 12.2 11.6*  HCT 42.5 36.9 35.0*  MCV 95.5 95.1 95.4  PLT 291 330 301     IMAGING STUDIES DG Chest 2 View  Result Date: 02/14/2021 CLINICAL DATA:  LEFT side chest pain radiating down LEFT arm with diaphoresis, 3 episodes in last 2 weeks, atrial fibrillation on EKG EXAM: CHEST - 2 VIEW COMPARISON:  07/28/2017 FINDINGS: Upper normal heart size. Mediastinal contours and pulmonary vascularity normal. Lungs clear. No pulmonary infiltrate, pleural effusion, or pneumothorax. Minimal biconvex thoracolumbar scoliosis. IMPRESSION: No acute abnormalities. Electronically Signed   By: Lavonia Dana M.D.   On: 02/14/2021 16:41   NM Myocar Multi W/Spect W/Wall Motion / EF  Result Date: 02/16/2021 CLINICAL DATA:  Chest pain EXAM: MYOCARDIAL IMAGING WITH SPECT (REST AND PHARMACOLOGIC-STRESS) GATED LEFT VENTRICULAR WALL MOTION STUDY LEFT VENTRICULAR EJECTION FRACTION TECHNIQUE: Standard myocardial SPECT imaging was performed after resting intravenous injection of 10.8 mCi Tc-24m Myoview. Subsequently, intravenous infusion of Lexiscan was performed under the supervision of the Cardiology staff. At peak effect of the drug, 29.1 mCi Tc-25m Myoview was injected intravenously and standard myocardial SPECT imaging was performed. Quantitative gated imaging was also performed to evaluate left ventricular wall motion, and estimate left ventricular ejection fraction. COMPARISON:  None. FINDINGS: Perfusion: Matched small region of reduced activity in the anterior apex on stress and rest images suggesting scarring. Reduced inferior wall activity probably from diaphragmatic attenuation rather than scar. No inducible ischemia. Wall Motion: Normal left  ventricular wall motion. No left ventricular dilation. Left Ventricular Ejection Fraction: 61 % End diastolic volume 81 ml End systolic volume 32 ml IMPRESSION: 1. No reversible ischemia. Small region of scarring in the anterior apex. Reduced inferior wall activity on stress and rest images favors diaphragmatic attenuation. 2. Normal left ventricular wall motion. 3. Left ventricular ejection fraction 61% 4. Non invasive risk stratification*: Low *2012 Appropriate Use Criteria for Coronary Revascularization Focused Update: J Am Coll Cardiol. 6283;66(2):947-654. http://content.[email or web address].aspx?articleid=[email or web address] Electronically Signed   By: Van Clines M.D.   On: 02/16/2021 17:27   ECHOCARDIOGRAM COMPLETE  Result Date: 02/15/2021    ECHOCARDIOGRAM REPORT   Patient Name:   TARHONDA HOLLENBERG Date of Exam: 02/15/2021 Medical Rec #:  [id number]      Height:       62.0 in Accession #:    [id number]     Weight:       176.4 lb Date of Birth:  [birth year]      BSA:          1.812 m Patient Age:    73 years       BP:           133/83 mmHg Patient Gender: F              HR:  78 bpm. Exam Location:  Inpatient Procedure: 2D Echo, Cardiac Doppler and Color Doppler Indications:    R07.9* Chest pain, unspecified  History:        Patient has no prior history of Echocardiogram examinations.                 Risk Factors:Hypertension.  Sonographer:    Jonelle Sidle Dance Referring Phys: [place]  1. Left ventricular ejection fraction, by estimation, is 45 to 50%. The left ventricle has mildly decreased function. Left ventricular endocardial border not optimally defined to evaluate regional wall motion. Basal segments are hypokinetic with best preseved function in mid and apex. Left ventricular diastolic parameters are indeterminate.  2. Right ventricular systolic function is mildly reduced. The right ventricular size is normal. Tricuspid regurgitation signal is inadequate for assessing PA  pressure.  3. Left atrial size was mildly dilated.  4. Right atrial size was mildly dilated.  5. The mitral valve is normal in structure. Trivial mitral valve regurgitation. No evidence of mitral stenosis.  6. The aortic valve is grossly normal. There is mild calcification of the aortic valve. Aortic valve regurgitation is not visualized. No aortic stenosis is present.  7. The inferior vena cava is normal in size with greater than 50% respiratory variability, suggesting right atrial pressure of 3 mmHg. FINDINGS  Left Ventricle: Left ventricular ejection fraction, by estimation, is 45 to 50%. The left ventricle has mildly decreased function. Left ventricular endocardial border not optimally defined to evaluate regional wall motion. The left ventricular internal cavity size was normal in size. There is no left ventricular hypertrophy. Left ventricular diastolic parameters are indeterminate. Right Ventricle: The right ventricular size is normal. No increase in right ventricular wall thickness. Right ventricular systolic function is mildly reduced. Tricuspid regurgitation signal is inadequate for assessing PA pressure. Left Atrium: Left atrial size was mildly dilated. Right Atrium: Right atrial size was mildly dilated. Pericardium: There is no evidence of pericardial effusion. Presence of pericardial fat pad. Mitral Valve: The mitral valve is normal in structure. Trivial mitral valve regurgitation. No evidence of mitral valve stenosis. Tricuspid Valve: The tricuspid valve is normal in structure. Tricuspid valve regurgitation is trivial. No evidence of tricuspid stenosis. Aortic Valve: The aortic valve is grossly normal. There is mild calcification of the aortic valve. Aortic valve regurgitation is not visualized. No aortic stenosis is present. Pulmonic Valve: The pulmonic valve was grossly normal. Pulmonic valve regurgitation is trivial. No evidence of pulmonic stenosis. Aorta: The aortic root is normal in size and  structure. Venous: The inferior vena cava is normal in size with greater than 50% respiratory variability, suggesting right atrial pressure of 3 mmHg. IAS/Shunts: No atrial level shunt detected by color flow Doppler.  LEFT VENTRICLE PLAX 2D LVIDd:         4.30 cm LVIDs:         3.30 cm LV PW:         1.00 cm LV IVS:        0.80 cm LVOT diam:     1.90 cm LV SV:         40 LV SV Index:   22 LVOT Area:     2.84 cm  RIGHT VENTRICLE          IVC RV Basal diam:  2.70 cm  IVC diam: 2.00 cm TAPSE (M-mode): 1.6 cm LEFT ATRIUM             Index  RIGHT ATRIUM           Index LA diam:        3.20 cm 1.77 cm/m  RA Area:     12.40 cm LA Vol (A2C):   38.4 ml 21.19 ml/m RA Volume:   25.90 ml  14.29 ml/m LA Vol (A4C):   54.3 ml 29.96 ml/m LA Biplane Vol: 50.6 ml 27.92 ml/m  AORTIC VALVE LVOT Vmax:   81.40 cm/s LVOT Vmean:  55.250 cm/s LVOT VTI:    0.142 m  AORTA Ao Asc diam: 3.50 cm MITRAL VALVE MV Area (PHT): 3.24 cm    SHUNTS MV Decel Time: 234 msec    Systemic VTI:  0.14 m MV E velocity: 94.75 cm/s  Systemic Diam: 1.90 cm Cherlynn Kaiser MD Electronically signed by Cherlynn Kaiser MD Signature Date/Time: 02/15/2021/3:39:55 PM    Final     DISCHARGE EXAMINATION: Vitals:   02/16/21 1252 02/16/21 1431 02/16/21 2100 02/17/21 0459  BP: (!) 143/69 131/78 137/77 129/77  Pulse: 74 72 77 66  Resp:  16 18 18   Temp:  98 F (36.7 C) 98.1 F (36.7 C) 97.8 F (36.6 C)  TempSrc:  Oral Oral Oral  SpO2:  100% 99% 97%  Weight:      Height:       General appearance: Awake alert.  In no distress Resp: Clear to auscultation bilaterally.  Normal effort Cardio: S1-S2 is normal regular.  No S3-S4.  No rubs murmurs or bruit GI: Abdomen is soft.  Nontender nondistended.  Bowel sounds are present normal.  No masses organomegaly    DISPOSITION: Home  Discharge Instructions    Call MD for:  difficulty breathing, headache or visual disturbances   Complete by: As directed    Call MD for:  extreme fatigue   Complete by:  As directed    Call MD for:  persistant dizziness or light-headedness   Complete by: As directed    Call MD for:  persistant nausea and vomiting   Complete by: As directed    Call MD for:  severe uncontrolled pain   Complete by: As directed    Call MD for:  temperature >100.4   Complete by: As directed    Diet - low sodium heart healthy   Complete by: As directed    Discharge instructions   Complete by: As directed    Please follow-up with your primary care provider in 1 week.  Please note that the dose of your levothyroxine has been decreased due to elevated thyroid hormone levels on blood work.  Your doctor will need to recheck your thyroid function test in 3 to 4 weeks.   Watch out for signs of bleeding now that you are on a blood thinner.  This could include blood in the urine, blood in the stool, black-colored stool or bleeding from any other source.  If you notice any bleeding please seek attention immediately.  You were cared for by a hospitalist during your hospital stay. If you have any questions about your discharge medications or the care you received while you were in the hospital after you are discharged, you can call the unit and asked to speak with the hospitalist on call if the hospitalist that took care of you is not available. Once you are discharged, your primary care physician will handle any further medical issues. Please note that NO REFILLS for any discharge medications will be authorized once you are discharged, as it is imperative that you return  to your primary care physician (or establish a relationship with a primary care physician if you do not have one) for your aftercare needs so that they can reassess your need for medications and monitor your lab values. If you do not have a primary care physician, you can call [phone] for a physician referral.   Increase activity slowly   Complete by: As directed         Allergies as of 02/17/2021      Reactions   Codeine     REACTION: nausea   Duloxetine Diarrhea   Lyrica [pregabalin] Other (See Comments)   Made pain in legs and feet worse.      Medication List    STOP taking these medications   diclofenac Sodium 1 % Gel Commonly known as: VOLTAREN   hydrochlorothiazide 25 MG tablet Commonly known as: HYDRODIURIL     TAKE these medications   albuterol 108 (90 Base) MCG/ACT inhaler Commonly known as: VENTOLIN HFA Inhale 2 puffs into the lungs every 4 (four) hours as needed for wheezing or shortness of breath.   ALPRAZolam 0.5 MG tablet Commonly known as: XANAX Take 0.5 mg by mouth daily as needed for anxiety.   apixaban 5 MG Tabs tablet Commonly known as: ELIQUIS Take 1 tablet (5 mg total) by mouth 2 (two) times daily.   atenolol 100 MG tablet Commonly known as: TENORMIN Take 100 mg by mouth daily.   atorvastatin 40 MG tablet Commonly known as: LIPITOR Take 1 tablet (40 mg total) by mouth every evening.   b complex vitamins tablet Take 1 tablet by mouth daily.   Biotin 1 MG Caps Take 1 capsule by mouth daily.   calcium carbonate 600 MG Tabs tablet Commonly known as: OS-CAL Take 600 mg by mouth 2 (two) times daily with a meal.   cyclobenzaprine 10 MG tablet Commonly known as: FLEXERIL Take 5-10 mg by mouth 3 (three) times daily as needed for muscle spasms.   HYDROcodone-acetaminophen 5-325 MG tablet Commonly known as: NORCO/VICODIN Take 1 tablet by mouth every 6 (six) hours as needed for moderate pain.   irbesartan 300 MG tablet Commonly known as: AVAPRO Take 300 mg by mouth every morning.   levothyroxine 50 MCG tablet Commonly known as: SYNTHROID Take 1 tablet (50 mcg total) by mouth daily.   lidocaine-prilocaine cream Commonly known as: EMLA Apply 1 application topically 3 (three) times daily as needed. What changed: reasons to take this   multivitamin tablet Take 1 tablet by mouth daily.   omeprazole 20 MG tablet Commonly known as: PRILOSEC OTC Take 20 mg by  mouth daily.   predniSONE 20 MG tablet Commonly known as: DELTASONE Take 20 mg by mouth daily with breakfast.   promethazine 25 MG tablet Commonly known as: PHENERGAN Take 25 mg by mouth every 6 (six) hours as needed for nausea or vomiting.   Selenium 100 MCG Tabs Take 1 tablet by mouth daily.   valACYclovir 1000 MG tablet Commonly known as: VALTREX Take 1,000 mg by mouth daily as needed (fever blisters).   vitamin C 500 MG tablet Commonly known as: ASCORBIC ACID Take 500 mg by mouth daily.   vitamin E 180 MG (400 UNITS) capsule Generic drug: vitamin E Take 400 Units by mouth daily.         Follow-up Information    [name], MD. Schedule an appointment as soon as possible for a visit in 1 week(s).   Specialty: Family Medicine Contact information: [place]  [place] [address] [phone]        Sueanne Margarita, MD Follow up.   Specialty: Cardiology Why: her office will call to schedule appointment Contact information: [address] [place] [address] [phone]               TOTAL DISCHARGE TIME: 35 minutes  [place]  [facility] Pager on [email or web address]  02/18/2021, 11:31 AM

## 2021-02-17 NOTE — Progress Notes (Signed)
AVS given to patient and explained at the bedside. Medications and follow up appointments have been explained with pt verbalizing understanding.  

## 2021-02-17 NOTE — Discharge Instructions (Signed)
Atrial Fibrillation  Atrial fibrillation is a type of heartbeat that is irregular or fast. If you have this condition, your heart beats without any order. This makes it hard for your heart to pump blood in a normal way. Atrial fibrillation may come and go, or it may become a long-lasting problem. If this condition is not treated, it can put you at higher risk for stroke, heart failure, and other heart problems. What are the causes? This condition may be caused by diseases that damage the heart. They include:  High blood pressure.  Heart failure.  Heart valve disease.  Heart surgery. Other causes include:  Diabetes.  Thyroid disease.  Being overweight.  Kidney disease. Sometimes the cause is not known. What increases the risk? You are more likely to develop this condition if:  You are older.  You smoke.  You exercise often and very hard.  You have a family history of this condition.  You are a man.  You use drugs.  You drink a lot of alcohol.  You have lung conditions, such as emphysema, pneumonia, or COPD.  You have sleep apnea. What are the signs or symptoms? Common symptoms of this condition include:  A feeling that your heart is beating very fast.  Chest pain or discomfort.  Feeling short of breath.  Suddenly feeling light-headed or weak.  Getting tired easily during activity.  Fainting.  Sweating. In some cases, there are no symptoms. How is this treated? Treatment for this condition depends on underlying conditions and how you feel when you have atrial fibrillation. They include:  Medicines to: ? Prevent blood clots. ? Treat heart rate or heart rhythm problems.  Using devices, such as a pacemaker, to correct heart rhythm problems.  Doing surgery to remove the part of the heart that sends bad signals.  Closing an area where clots can form in the heart (left atrial appendage). In some cases, your doctor will treat other underlying  conditions. Follow these instructions at home: Medicines  Take over-the-counter and prescription medicines only as told by your doctor.  Do not take any new medicines without first talking to your doctor.  If you are taking blood thinners: ? Talk with your doctor before you take any medicines that have aspirin or NSAIDs, such as ibuprofen, in them. ? Take your medicine exactly as told by your doctor. Take it at the same time each day. ? Avoid activities that could hurt or bruise you. Follow instructions about how to prevent falls. ? Wear a bracelet that says you are taking blood thinners. Or, carry a card that lists what medicines you take. Lifestyle  Do not use any products that have nicotine or tobacco in them. These include cigarettes, e-cigarettes, and chewing tobacco. If you need help quitting, ask your doctor.  Eat heart-healthy foods. Talk with your doctor about the right eating plan for you.  Exercise regularly as told by your doctor.  Do not drink alcohol.  Lose weight if you are overweight.  Do not use drugs, including cannabis.      General instructions  If you have a condition that causes breathing to stop for a short period of time (apnea), treat it as told by your doctor.  Keep a healthy weight. Do not use diet pills unless your doctor says they are safe for you. Diet pills may make heart problems worse.  Keep all follow-up visits as told by your doctor. This is important. Contact a doctor if:  You notice  a change in the speed, rhythm, or strength of your heartbeat.  You are taking a blood-thinning medicine and you get more bruising.  You get tired more easily when you move or exercise.  You have a sudden change in weight. Get help right away if:  You have pain in your chest or your belly (abdomen).  You have trouble breathing.  You have side effects of blood thinners, such as blood in your vomit, poop (stool), or pee (urine), or bleeding that cannot  stop.  You have any signs of a stroke. "BE FAST" is an easy way to remember the main warning signs: ? B - Balance. Signs are dizziness, sudden trouble walking, or loss of balance. ? E - Eyes. Signs are trouble seeing or a change in how you see. ? F - Face. Signs are sudden weakness or loss of feeling in the face, or the face or eyelid drooping on one side. ? A - Arms. Signs are weakness or loss of feeling in an arm. This happens suddenly and usually on one side of the body. ? S - Speech. Signs are sudden trouble speaking, slurred speech, or trouble understanding what people say. ? T - Time. Time to call emergency services. Write down what time symptoms started.  You have other signs of a stroke, such as: ? A sudden, very bad headache with no known cause. ? Feeling like you may vomit (nausea). ? Vomiting. ? A seizure. These symptoms may be an emergency. Do not wait to see if the symptoms will go away. Get medical help right away. Call your local emergency services (911 in the [place] Do not drive yourself to the hospital.   Summary  Atrial fibrillation is a type of heartbeat that is irregular or fast.  You are at higher risk of this condition if you smoke, are older, have diabetes, or are overweight.  Follow your doctor's instructions about medicines, diet, exercise, and follow-up visits.  Get help right away if you have signs or symptoms of a stroke.  Get help right away if you cannot catch your breath, or you have chest pain or discomfort. This information is not intended to replace advice given to you by your health care provider. Make sure you discuss any questions you have with your health care provider. Document Revised: 05/11/2019 Document Reviewed: 05/11/2019 Elsevier Patient Education  2021 [employer]vier Inc. Information on my medicine - ELIQUIS (apixaban)  Why was Eliquis prescribed for you? Eliquis was prescribed for you to reduce the risk of a blood clot forming that can  cause a stroke if you have a medical condition called atrial fibrillation (a type of irregular heartbeat).  What do You need to know about Eliquis ? Take your Eliquis TWICE DAILY - one tablet in the morning and one tablet in the evening with or without food. If you have difficulty swallowing the tablet whole please discuss with your pharmacist how to take the medication safely.  Take Eliquis exactly as prescribed by your doctor and DO NOT stop taking Eliquis without talking to the doctor who prescribed the medication.  Stopping may increase your risk of developing a stroke.  Refill your prescription before you run out.  After discharge, you should have regular check-up appointments with your healthcare provider that is prescribing your Eliquis.  In the future your dose may need to be changed if your kidney function or weight changes by a significant amount or as you get older.  What do you do if   you miss a dose? If you miss a dose, take it as soon as you remember on the same day and resume taking twice daily.  Do not take more than one dose of ELIQUIS at the same time to make up a missed dose.  Important Safety Information A possible side effect of Eliquis is bleeding. You should call your healthcare provider right away if you experience any of the following: ? Bleeding from an injury or your nose that does not stop. ? Unusual colored urine (red or dark brown) or unusual colored stools (red or black). ? Unusual bruising for unknown reasons. ? A serious fall or if you hit your head (even if there is no bleeding).  Some medicines may interact with Eliquis and might increase your risk of bleeding or clotting while on Eliquis. To help avoid this, consult your healthcare provider or pharmacist prior to using any new prescription or non-prescription medications, including herbals, vitamins, non-steroidal anti-inflammatory drugs (NSAIDs) and supplements.  This website has more information on  Eliquis (apixaban): http://www.eliquis.com/eliquis/home  

## 2021-02-17 NOTE — Progress Notes (Signed)
Progress Note  Patient Name: Tabitha Murphy Date of Encounter: 02/17/2021  [facility] Cardiologist: Fransico Him, MD   Subjective   No further chest pain or SOB.  Nuclear stress test with no ischemia  Inpatient Medications    Scheduled Meds: . apixaban  5 mg Oral BID  . aspirin EC  81 mg Oral Daily  . atenolol  100 mg Oral Daily  . atorvastatin  40 mg Oral QPM  . irbesartan  300 mg Oral q morning  . levothyroxine  25 mcg Oral Daily  . omeprazole  20 mg Oral Daily  . predniSONE  20 mg Oral Q breakfast   Continuous Infusions:  PRN Meds: acetaminophen **OR** acetaminophen, albuterol, ALPRAZolam, cyclobenzaprine   Vital Signs    Vitals:   02/16/21 1252 02/16/21 1431 02/16/21 2100 02/17/21 0459  BP: (!) 143/69 131/78 137/77 129/77  Pulse: 74 72 77 66  Resp:  16 18 18   Temp:  98 F (36.7 C) 98.1 F (36.7 C) 97.8 F (36.6 C)  TempSrc:  Oral Oral Oral  SpO2:  100% 99% 97%  Weight:      Height:       No intake or output data in the 24 hours ending 02/17/21 0834 Last 3 Weights 02/15/2021 02/14/2021 04/09/2020  Weight (lbs) 177 lb 176 lb 5.9 oz 177 lb  Weight (kg) 80.287 kg 80 kg 80.287 kg      Telemetry    NSR- Personally Reviewed  ECG    No new EKG to review - Personally Reviewed  Physical Exam   GEN: Well nourished, well developed in no acute distress HEENT: Normal NECK: No JVD; No carotid bruits LYMPHATICS: No lymphadenopathy CARDIAC:RRR, no murmurs, rubs, gallops RESPIRATORY:  Clear to auscultation without rales, wheezing or rhonchi  ABDOMEN: Soft, non-tender, non-distended MUSCULOSKELETAL:  No edema; No deformity  SKIN: Warm and dry NEUROLOGIC:  Alert and oriented x 3 PSYCHIATRIC:  Normal affect    Labs    High Sensitivity Troponin:   Recent Labs  Lab 02/14/21 1653 02/14/21 2128  TROPONINIHS 14 15      Chemistry Recent Labs  Lab 02/15/21 1000 02/16/21 0404 02/17/21 0401  NA 131* 133* 133*  K 2.9* 5.0 3.9  CL 94* 96* 97*  CO2  24 27 26   GLUCOSE 87 107* 132*  BUN 19 19 21   CREATININE 0.72 0.76 0.74  CALCIUM 8.7* 8.8* 8.6*  PROT  --  6.0*  --   ALBUMIN  --  3.4*  --   AST  --  15  --   ALT  --  17  --   ALKPHOS  --  61  --   BILITOT  --  1.0  --   GFRNONAA >60 >60 >60  ANIONGAP 13 10 10      Hematology Recent Labs  Lab 02/14/21 1652 02/16/21 0404 02/17/21 0401  WBC 16.0* 11.1* 11.6*  RBC 4.45 3.88 3.67*  HGB 14.2 12.2 11.6*  HCT 42.5 36.9 35.0*  MCV 95.5 95.1 95.4  MCH 31.9 31.4 31.6  MCHC 33.4 33.1 33.1  RDW 13.8 14.0 14.0  PLT 291 330 301    BNPNo results for input(s): BNP, PROBNP in the last 168 hours.   DDimer No results for input(s): DDIMER in the last 168 hours.   CHA2DS2-VASc Score = 5  This indicates a 7.2% annual risk of stroke. The patient's score is based upon: CHF History: No HTN History: Yes Diabetes History: No Stroke History: No Vascular Disease  History: Yes Age Score: 2 Gender Score: 1      Radiology    NM Myocar Multi W/Spect W/Wall Motion / EF  Result Date: 02/16/2021 CLINICAL DATA:  Chest pain EXAM: MYOCARDIAL IMAGING WITH SPECT (REST AND PHARMACOLOGIC-STRESS) GATED LEFT VENTRICULAR WALL MOTION STUDY LEFT VENTRICULAR EJECTION FRACTION TECHNIQUE: Standard myocardial SPECT imaging was performed after resting intravenous injection of 10.8 mCi Tc-4m Myoview. Subsequently, intravenous infusion of Lexiscan was performed under the supervision of the Cardiology staff. At peak effect of the drug, 29.1 mCi Tc-54m Myoview was injected intravenously and standard myocardial SPECT imaging was performed. Quantitative gated imaging was also performed to evaluate left ventricular wall motion, and estimate left ventricular ejection fraction. COMPARISON:  None. FINDINGS: Perfusion: Matched small region of reduced activity in the anterior apex on stress and rest images suggesting scarring. Reduced inferior wall activity probably from diaphragmatic attenuation rather than scar. No  inducible ischemia. Wall Motion: Normal left ventricular wall motion. No left ventricular dilation. Left Ventricular Ejection Fraction: 61 % End diastolic volume 81 ml End systolic volume 32 ml IMPRESSION: 1. No reversible ischemia. Small region of scarring in the anterior apex. Reduced inferior wall activity on stress and rest images favors diaphragmatic attenuation. 2. Normal left ventricular wall motion. 3. Left ventricular ejection fraction 61% 4. Non invasive risk stratification*: Low *2012 Appropriate Use Criteria for Coronary Revascularization Focused Update: J Am Coll Cardiol. 6387;56(4):332-951. http://content.[email or web address].aspx?articleid=[email or web address] Electronically Signed   By: Van Clines M.D.   On: 02/16/2021 17:27   ECHOCARDIOGRAM COMPLETE  Result Date: 02/15/2021    ECHOCARDIOGRAM REPORT   Patient Name:   Tabitha Murphy Date of Exam: 02/15/2021 Medical Rec #:  [id number]      Height:       62.0 in Accession #:    [id number]     Weight:       176.4 lb Date of Birth:  Mar 10, 1941      BSA:          1.812 m Patient Age:    80 years       BP:           133/83 mmHg Patient Gender: F              HR:           78 bpm. Exam Location:  Inpatient Procedure: 2D Echo, Cardiac Doppler and Color Doppler Indications:    R07.9* Chest pain, unspecified  History:        Patient has no prior history of Echocardiogram examinations.                 Risk Factors:Hypertension.  Sonographer:    Jonelle Sidle Dance Referring Phys: [place]  1. Left ventricular ejection fraction, by estimation, is 45 to 50%. The left ventricle has mildly decreased function. Left ventricular endocardial border not optimally defined to evaluate regional wall motion. Basal segments are hypokinetic with best preseved function in mid and apex. Left ventricular diastolic parameters are indeterminate.  2. Right ventricular systolic function is mildly reduced. The right ventricular size is normal. Tricuspid  regurgitation signal is inadequate for assessing PA pressure.  3. Left atrial size was mildly dilated.  4. Right atrial size was mildly dilated.  5. The mitral valve is normal in structure. Trivial mitral valve regurgitation. No evidence of mitral stenosis.  6. The aortic valve is grossly normal. There is mild calcification of the aortic valve. Aortic valve regurgitation is not visualized. No aortic stenosis is  present.  7. The inferior vena cava is normal in size with greater than 50% respiratory variability, suggesting right atrial pressure of 3 mmHg. FINDINGS  Left Ventricle: Left ventricular ejection fraction, by estimation, is 45 to 50%. The left ventricle has mildly decreased function. Left ventricular endocardial border not optimally defined to evaluate regional wall motion. The left ventricular internal cavity size was normal in size. There is no left ventricular hypertrophy. Left ventricular diastolic parameters are indeterminate. Right Ventricle: The right ventricular size is normal. No increase in right ventricular wall thickness. Right ventricular systolic function is mildly reduced. Tricuspid regurgitation signal is inadequate for assessing PA pressure. Left Atrium: Left atrial size was mildly dilated. Right Atrium: Right atrial size was mildly dilated. Pericardium: There is no evidence of pericardial effusion. Presence of pericardial fat pad. Mitral Valve: The mitral valve is normal in structure. Trivial mitral valve regurgitation. No evidence of mitral valve stenosis. Tricuspid Valve: The tricuspid valve is normal in structure. Tricuspid valve regurgitation is trivial. No evidence of tricuspid stenosis. Aortic Valve: The aortic valve is grossly normal. There is mild calcification of the aortic valve. Aortic valve regurgitation is not visualized. No aortic stenosis is present. Pulmonic Valve: The pulmonic valve was grossly normal. Pulmonic valve regurgitation is trivial. No evidence of pulmonic  stenosis. Aorta: The aortic root is normal in size and structure. Venous: The inferior vena cava is normal in size with greater than 50% respiratory variability, suggesting right atrial pressure of 3 mmHg. IAS/Shunts: No atrial level shunt detected by color flow Doppler.  LEFT VENTRICLE PLAX 2D LVIDd:         4.30 cm LVIDs:         3.30 cm LV PW:         1.00 cm LV IVS:        0.80 cm LVOT diam:     1.90 cm LV SV:         40 LV SV Index:   22 LVOT Area:     2.84 cm  RIGHT VENTRICLE          IVC RV Basal diam:  2.70 cm  IVC diam: 2.00 cm TAPSE (M-mode): 1.6 cm LEFT ATRIUM             Index       RIGHT ATRIUM           Index LA diam:        3.20 cm 1.77 cm/m  RA Area:     12.40 cm LA Vol (A2C):   38.4 ml 21.19 ml/m RA Volume:   25.90 ml  14.29 ml/m LA Vol (A4C):   54.3 ml 29.96 ml/m LA Biplane Vol: 50.6 ml 27.92 ml/m  AORTIC VALVE LVOT Vmax:   81.40 cm/s LVOT Vmean:  55.250 cm/s LVOT VTI:    0.142 m  AORTA Ao Asc diam: 3.50 cm MITRAL VALVE MV Area (PHT): 3.24 cm    SHUNTS MV Decel Time: 234 msec    Systemic VTI:  0.14 m MV E velocity: 94.75 cm/s  Systemic Diam: 1.90 cm Cherlynn Kaiser MD Electronically signed by Cherlynn Kaiser MD Signature Date/Time: 02/15/2021/3:39:55 PM    Final     Cardiac Studies   2D echo 01/2021 IMPRESSIONS   1. Left ventricular ejection fraction, by estimation, is 45 to 50%. The  left ventricle has mildly decreased function. Left ventricular endocardial  border not optimally defined to evaluate regional wall motion. Basal  segments are hypokinetic with best  preseved  function in mid and apex. Left ventricular diastolic parameters  are indeterminate.  2. Right ventricular systolic function is mildly reduced. The right  ventricular size is normal. Tricuspid regurgitation signal is inadequate  for assessing PA pressure.  3. Left atrial size was mildly dilated.  4. Right atrial size was mildly dilated.  5. The mitral valve is normal in structure. Trivial mitral valve   regurgitation. No evidence of mitral stenosis.  6. The aortic valve is grossly normal. There is mild calcification of the  aortic valve. Aortic valve regurgitation is not visualized. No aortic  stenosis is present.  7. The inferior vena cava is normal in size with greater than 50%  respiratory variability, suggesting right atrial pressure of 3 mmHg.   Lexiscan myoview 01/2021  IMPRESSION: 1. No reversible ischemia. Small region of scarring in the anterior apex. Reduced inferior wall activity on stress and rest images favors diaphragmatic attenuation.  2. Normal left ventricular wall motion.  3. Left ventricular ejection fraction 61%  4. Non invasive risk stratification*: Low  Patient Profile     80 y.o. female with a hx of mild CAD by cath 2012, HTN, hypothyroidism, hiatal hernia, diverticulosis, mild hyponatremia by prior labs, pre-DM (A1C 6 in 03/2020), chronic back issues who is being seen  for the evaluation of chest pain/afib at the request of Dr. Hal Hope.  Assessment & Plan    1. Episodic chest pain (with h/o mild CAD) - mixed typical atypical features - occurs at rest but not exertion, but was associated with significant diaphoresis - hsTroponins negative and reassuring - not clearly related to atrial fib as she remains in afib at same rate from presentation and is now asymptomatic - 2D echo with mild LV dysfunction EF 45-50% with basal segments HK but normal on myoview - Lexiscan myoview showed no ischemia and normal LVF - continue BB and statin - ok to stop ASA   2. Newly recognized atrial fibrillation - unknown chronicity, last in sinus 2018 - HR is well controlled on tele - she was started on apixaban for CHADSVASC tentatively 5 for HTN, age x 2, vascular disease (mild CAD) and female - discussed rationale for therapy and bleeding precautions with patient - 2D echo with mildly reduced LVF with EF 45-50% but normal LVF on Lexiscan myoview - she has no  present indication for acute cardioversion, so anticipate we'll follow in the outpatient setting and consider DCCV after at least 3 weeks of uninterrupted anticoagulation -continue Atenolol and Eliquis 5mg  BID  3. HTN - BP remains controlled on exam  - continue Irbesartan 300mg  daily and Atenolol 100mg  daily  4. NSVT (14 beats, no recurrent episodes) - ? Relationship to hypokalemia (K+ 2.9) - Mg 1.9>>repleted  - K+ repleated and 3.9 today - continue BB - further arrhythmias on tele - EF on echo mildly reduced at 45-50% but normal LVF with EF 60% on echo  5. Lab abnormalities including worsening hyponatremia, leukocytosis, hypokalemia and abnormal TSH - HCTZ discontinued, may be cause of low Na/K - s/p replacement of K - Na and K+ normal today - TSH suppressed but she is on Biotin as outpatient which can interfere with assay so would suggest repeating studies when off Biotin for a week. If she is truly tipping towards hyperthyroid, this could be driving Afib  6.  HLD -LDL goal < 70 with hx of mild CAD -LDL 106 this admit -started on Atrovastatin 40mg  dialy -check FLP and ALT In 6 weeks  [facility] will sign off.   Medication Recommendations:  Irbesartan 300mg  daily, Atenolol 100mg  daily, Atorvastatin 40mg  daily and Eliquis 5mg  BID Other recommendations (labs, testing, etc):  FLP and ALT In 6 weeks Follow up as an outpatient:  followup in 4 weeks with extender to set up DCCV  For questions or updates, please contact [facility] Please consult [email or web address] for contact info under        Signed, Fransico Him, MD  02/17/2021, 8:34 AM

## 2021-02-17 NOTE — Progress Notes (Signed)
Pt lives with her son. She plans to return home with the support of her children. Provided pt with a 30-day trial offer for Eliquis and a $10 co-pay card.

## 2021-02-21 ENCOUNTER — Ambulatory Visit
Admission: RE | Admit: 2021-02-21 | Discharge: 2021-02-21 | Disposition: A | Discharge status: Home / Self Care | Payer: PPO | Source: Ambulatory Visit | Attending: Chiropractic Medicine | Admitting: Chiropractic Medicine

## 2021-02-21 ENCOUNTER — Other Ambulatory Visit: Payer: Self-pay

## 2021-02-21 DIAGNOSIS — M48061 Spinal stenosis, lumbar region without neurogenic claudication: Secondary | ICD-10-CM | POA: Diagnosis not present

## 2021-02-21 DIAGNOSIS — M545 Low back pain, unspecified: Secondary | ICD-10-CM | POA: Diagnosis not present

## 2021-02-25 DIAGNOSIS — G629 Polyneuropathy, unspecified: Secondary | ICD-10-CM | POA: Diagnosis not present

## 2021-02-25 DIAGNOSIS — I4891 Unspecified atrial fibrillation: Secondary | ICD-10-CM | POA: Diagnosis not present

## 2021-02-25 DIAGNOSIS — I10 Essential (primary) hypertension: Secondary | ICD-10-CM | POA: Diagnosis not present

## 2021-02-25 DIAGNOSIS — L299 Pruritus, unspecified: Secondary | ICD-10-CM | POA: Diagnosis not present

## 2021-02-25 DIAGNOSIS — K219 Gastro-esophageal reflux disease without esophagitis: Secondary | ICD-10-CM | POA: Diagnosis not present

## 2021-02-25 DIAGNOSIS — E7800 Pure hypercholesterolemia, unspecified: Secondary | ICD-10-CM | POA: Diagnosis not present

## 2021-02-25 DIAGNOSIS — J45909 Unspecified asthma, uncomplicated: Secondary | ICD-10-CM | POA: Diagnosis not present

## 2021-02-25 DIAGNOSIS — R7309 Other abnormal glucose: Secondary | ICD-10-CM | POA: Diagnosis not present

## 2021-02-25 DIAGNOSIS — E039 Hypothyroidism, unspecified: Secondary | ICD-10-CM | POA: Diagnosis not present

## 2021-02-25 DIAGNOSIS — F419 Anxiety disorder, unspecified: Secondary | ICD-10-CM | POA: Diagnosis not present

## 2021-02-25 DIAGNOSIS — R609 Edema, unspecified: Secondary | ICD-10-CM | POA: Diagnosis not present

## 2021-02-25 DIAGNOSIS — Z0000 Encounter for general adult medical examination without abnormal findings: Secondary | ICD-10-CM | POA: Diagnosis not present

## 2021-03-05 ENCOUNTER — Telehealth: Payer: Self-pay | Admitting: Cardiology

## 2021-03-05 NOTE — Telephone Encounter (Signed)
Patient called to say that she having a epidural on 4/28 and the wanting to know what she needs to do bout the Eliquis that she is currently taking. She stated that she needs something sent to the dr to say is okay for her to stop taking Eliquis. Please advise

## 2021-03-05 NOTE — Telephone Encounter (Signed)
S/w the pt and informed her the MD office that will be doing the epidural injection will need to fax over a clearance request to our office. Pt said it is Dr. Nelva Bush office who will be doing the injection. Pt saw Dr. Radford Pax in the hospital and has an appt 03/27/21. Pt's epidural injection is 03/28/21. Pt is on Eliquis. I explained to the pt that her injection may be postponed in light that of her being on Eliquis and will most likely need to be held. I informed the pt that this decision will be up to Dr. Radford Pax when she see's her on 03/27/21. Pt said this is why she called in now to since she is not seeing Dr. Radford Pax until 4/27 and injection is 4/28. I again stated need the clearance request to be faxed to our office for review by the cardiologist. Pt states she left message for Dr. Nelva Bush office with Dr. Theodosia Blender name and office phone number.

## 2021-03-06 NOTE — Telephone Encounter (Signed)
Awaiting cardiac clearance request from Dr. Nelva Bush' office.

## 2021-03-21 ENCOUNTER — Telehealth: Payer: Self-pay | Admitting: Cardiology

## 2021-03-21 NOTE — Telephone Encounter (Signed)
Please contact Dr. Herma Mering office and obtain cardiac clearance paperwork.  I cannot locate in in our system.

## 2021-03-21 NOTE — Telephone Encounter (Signed)
Our office never received actual clearance request though pt has been cleared, see notes; notes faxed to Dr. Nelva Bush with [facility] fax # [phone]

## 2021-03-21 NOTE — Telephone Encounter (Signed)
    Tabitha Murphy DOB:  04-05-1941  MRN:  [id number]   Primary Cardiologist: Fransico Him, MD  Chart reviewed as part of pre-operative protocol coverage. Given past medical history and time since last visit, based on ACC/AHA guidelines, Tabitha Murphy would be at acceptable risk for the planned procedure without further cardiovascular testing.   The patient was advised that if she develops new symptoms prior to surgery to contact our office to arrange for a follow-up visit, and she verbalized understanding.  Patient will need to hold Eliquis for 3 days prior to the epidural injection and restart as soon as possible afterward at the surgeon's discretion.  I will route this recommendation to the requesting party via Epic fax function and remove from pre-op pool.  Please call with questions.  Note, I have called the patient and spoke with her regarding the epidural injection.  When she see Dr. Radford Pax next week, there is a chance Dr. Radford Pax may recommend a cardioversion as mentioned in her progress note in the hospital.  She will need uninterrupted anticoagulation for 3 weeks before the cardioversion and 4 weeks afterward.  If she interrupt anticoagulation now, she will need to delay the cardioversion until later time (again, need 3 weeks of uninterrupted anticoagulation before cardioversion can happen).  This is really up to the patient to see which procedure she would like first.  She says she can deal with the pain.  She would rather have the cardiac procedure out of the way first.  She plans to delay this back injection until later.  I will go ahead and clear her for the procedure now.  If the epidural injection happens after the cardioversion, it is important to note that she will need at least 4 weeks of uninterrupted anticoagulation before she can begin to hold Eliquis for 3 days for the procedure.   [place], [place] 03/21/2021, 12:49 PM

## 2021-03-21 NOTE — Telephone Encounter (Signed)
Clinical pharmacist to review Eliquis, see telephone note from 03/05/2021.  Apparently patient is pending epidural injection by Dr. Nelva Bush on 4/28.  Her appointment with Dr. Radford Pax is on 03/27/2021.  There is not enough holding time between Dr. Theodosia Blender appointment and her epidural injection.  Therefore we will need to calculate Eliquis holding time prior to that date.

## 2021-03-21 NOTE — Telephone Encounter (Signed)
ALL DOAC's need 72 hours HOLD prior to epidural injections/anesthesia.  1st visit with [facility] scheduled for 03/27/2021  Patient with diagnosis of NEW ONSET ATRIAL FIBRILLATION on ELIQUIS for anticoagulation.    Procedure: EPIDURAL INJECTION Date of procedure: 03/28/2021   CHA2DS2-VASc Score = 5  This indicates a 7.2% annual risk of stroke. The patient's score is based upon: CHF History: No HTN History: Yes Diabetes History: No Stroke History: No Vascular Disease History: Yes Age Score: 2 Gender Score: 1   CrCl 63ml/min Platelet count 301

## 2021-03-21 NOTE — Telephone Encounter (Signed)
Pt is calling to confirm paperwork from DR.Ramos office for her epidural was received.Pt states the paperwork was sent last week. Please advise

## 2021-03-25 DIAGNOSIS — R921 Mammographic calcification found on diagnostic imaging of breast: Secondary | ICD-10-CM | POA: Diagnosis not present

## 2021-03-27 ENCOUNTER — Ambulatory Visit: Payer: PPO | Admitting: Cardiology

## 2021-03-27 ENCOUNTER — Encounter: Payer: Self-pay | Admitting: Cardiology

## 2021-03-27 ENCOUNTER — Other Ambulatory Visit: Payer: Self-pay

## 2021-03-27 VITALS — BP 150/70 | HR 75 | Ht 62.0 in | Wt 175.0 lb

## 2021-03-27 DIAGNOSIS — E7800 Pure hypercholesterolemia, unspecified: Secondary | ICD-10-CM | POA: Diagnosis not present

## 2021-03-27 DIAGNOSIS — I4819 Other persistent atrial fibrillation: Secondary | ICD-10-CM | POA: Diagnosis not present

## 2021-03-27 DIAGNOSIS — I2583 Coronary atherosclerosis due to lipid rich plaque: Secondary | ICD-10-CM

## 2021-03-27 DIAGNOSIS — I2510 Atherosclerotic heart disease of native coronary artery without angina pectoris: Secondary | ICD-10-CM | POA: Diagnosis not present

## 2021-03-27 DIAGNOSIS — I10 Essential (primary) hypertension: Secondary | ICD-10-CM | POA: Diagnosis not present

## 2021-03-27 DIAGNOSIS — I420 Dilated cardiomyopathy: Secondary | ICD-10-CM | POA: Insufficient documentation

## 2021-03-27 DIAGNOSIS — E785 Hyperlipidemia, unspecified: Secondary | ICD-10-CM | POA: Insufficient documentation

## 2021-03-27 LAB — COMPREHENSIVE METABOLIC PANEL
ALT: 14 IU/L (ref 0–32)
AST: 14 IU/L (ref 0–40)
Albumin/Globulin Ratio: 1.6 (ref 1.2–2.2)
Albumin: 4.3 g/dL (ref 3.7–4.7)
Alkaline Phosphatase: 107 IU/L (ref 44–121)
BUN/Creatinine Ratio: 9 — ABNORMAL LOW (ref 12–28)
BUN: 8 mg/dL (ref 8–27)
Bilirubin Total: 0.4 mg/dL (ref 0.0–1.2)
CO2: 27 mmol/L (ref 20–29)
Calcium: 9.4 mg/dL (ref 8.7–10.3)
Chloride: 93 mmol/L — ABNORMAL LOW (ref 96–106)
Creatinine, Ser: 0.89 mg/dL (ref 0.57–1.00)
Globulin, Total: 2.7 g/dL (ref 1.5–4.5)
Glucose: 119 mg/dL — ABNORMAL HIGH (ref 65–99)
Potassium: 4.3 mmol/L (ref 3.5–5.2)
Sodium: 135 mmol/L (ref 134–144)
Total Protein: 7 g/dL (ref 6.0–8.5)
eGFR: 66 mL/min/{1.73_m2} (ref >59)

## 2021-03-27 LAB — LIPID PANEL
Chol/HDL Ratio: 4.2 ratio (ref 0.0–4.4)
Cholesterol, Total: 184 mg/dL (ref 100–199)
HDL: 44 mg/dL (ref >39)
LDL Chol Calc (NIH): 110 mg/dL — ABNORMAL HIGH (ref 0–99)
Triglycerides: 172 mg/dL — ABNORMAL HIGH (ref 0–149)
VLDL Cholesterol Cal: 30 mg/dL (ref 5–40)

## 2021-03-27 NOTE — Progress Notes (Signed)
Cardiology Office Note:    Date:  03/27/2021   ID:  TRISH MANCINELLI, DOB 1941/04/07, MRN [id number]  PCP:  [name], MD  Cardiologist:  Fransico Him, MD    Referring MD: [name], MD   Chief Complaint  Patient presents with  . Atrial Fibrillation  . Coronary Artery Disease  . Hypertension  . Hyperlipidemia  . Cardiomyopathy    History of Present Illness:    Tabitha Murphy is a 80 y.o. female with a hx of mild ASCAD with 30% mLCx by cath in 2012 with normal LVF, HTN, HH, pre DM who presented to the ER in March 2022 with complaints of chest pain.  She had had several episodes of chest pain usually lasting 20-30 min associated with diaphoresis and then spontaneously resolve.   The day PTA she was at the [facility] and had just finished swimming.  She got out of the pool and developed chest pressure and broke out in a profuse sweat.  She denied any SOB, nausea or radiation of the discomfort. She met a friend at the [facility] to go to lunch and told her that she did not feel well and the staff at the [facility].    On arrival with as in new rate controlled afib with HR in the 60-80's.  She had a 14 beat run of WCT but this was in the setting of hypokalemia (K+ 2.9). TSH was low at 0.16. She ruled out for MI with normal hsTrop.  2D echo showed mild LV dysfunction with EF 45-50% with HK of the basal segments and myoview showed no ischemia.   She was started on apixaban 26m BID for CHADS2VASC score of 5.  Plan was for outpt followup and DCCV after 3 weeks of anticoagulation if still in afib. She was continued on Atenolol 1064mdaily.  She was also started on statin for an LDL of 106.    SHe is here today for followup and is doing well.  She had one episode of chest pressure that she described as someone squeezing her chest but only lasted about 5 minutes and resolved.  She denies any SOB, DOE, PND, orthopnea, LE edema, dizziness or syncope. She notices her heart skipping all the time which she  says that she has had for years. She is compliant with her meds and is tolerating meds with no SE.    Past Medical History:  Diagnosis Date  . Benign essential HTN   . CAD (coronary artery disease), native coronary artery    Cath 2012 with 30% LCx  . DCM [place]) [place]   ? tachy mediated from afib with RVR.  EF 45-50% by echo 01/2021  . Diverticulosis   . Hiatal hernia   . HLD (hyperlipidemia)   . Persistent atrial fibrillation (HCC)    On DOAC  . S/P laparoscopic cholecystectomy 07/28/2011   23 years ago  . Thyroid goiter     Past Surgical History:  Procedure Laterality Date  . child birth     x3  . CHOLECYSTECTOMY      Current Medications: Current Meds  Medication Sig  . albuterol (VENTOLIN HFA) 108 (90 Base) MCG/ACT inhaler Inhale 2 puffs into the lungs every 4 (four) hours as needed for wheezing or shortness of breath.  . ALPRAZolam (XANAX) 0.5 MG tablet Take 0.5 mg by mouth daily as needed for anxiety.  . Marland Kitchenpixaban (ELIQUIS) 5 MG TABS tablet Take 1 tablet (5 mg total) by mouth  2 (two) times daily.  Marland Kitchen atenolol (TENORMIN) 100 MG tablet Take 100 mg by mouth daily.  Marland Kitchen atorvastatin (LIPITOR) 40 MG tablet Take 1 tablet (40 mg total) by mouth every evening.  Marland Kitchen b complex vitamins tablet Take 1 tablet by mouth daily.  . Biotin 1 MG CAPS Take 1 capsule by mouth daily.  . calcium carbonate (OS-CAL) 600 MG TABS Take 600 mg by mouth 2 (two) times daily with a meal.  . cyclobenzaprine (FLEXERIL) 10 MG tablet Take 5-10 mg by mouth 3 (three) times daily as needed for muscle spasms.  . hydrochlorothiazide (HYDRODIURIL) 12.5 MG tablet Take 12.5 mg by mouth daily.  Marland Kitchen HYDROcodone-acetaminophen (NORCO/VICODIN) 5-325 MG tablet Take 1 tablet by mouth every 6 (six) hours as needed for moderate pain.  Marland Kitchen irbesartan (AVAPRO) 300 MG tablet Take 300 mg by mouth every morning.  Marland Kitchen levothyroxine (SYNTHROID) 25 MCG tablet Take 25 mcg by mouth every morning.  Marland Kitchen levothyroxine (SYNTHROID) 50  MCG tablet Take 1 tablet (50 mcg total) by mouth daily.  Marland Kitchen lidocaine-prilocaine (EMLA) cream Apply 1 application topically 3 (three) times daily as needed.  . Multiple Vitamin (MULTIVITAMIN) tablet Take 1 tablet by mouth daily.  Marland Kitchen omeprazole (PRILOSEC OTC) 20 MG tablet Take 20 mg by mouth daily.  . predniSONE (DELTASONE) 20 MG tablet Take 20 mg by mouth daily with breakfast.  . promethazine (PHENERGAN) 25 MG tablet Take 25 mg by mouth every 6 (six) hours as needed for nausea or vomiting.  . Selenium 100 MCG TABS Take 1 tablet by mouth daily.  . valACYclovir (VALTREX) 1000 MG tablet Take 1,000 mg by mouth daily as needed (fever blisters).  . vitamin C (ASCORBIC ACID) 500 MG tablet Take 500 mg by mouth daily.  . vitamin E 180 MG (400 UNITS) capsule Take 400 Units by mouth daily.     Allergies:   Codeine, Duloxetine, and Lyrica [pregabalin]   Social History   Socioeconomic History  . Marital status: Widowed    Spouse name: Not on file  . Number of children: Not on file  . Years of education: Not on file  . Highest education level: Not on file  Occupational History  . Occupation: retired  Tobacco Use  . Smoking status: Never Smoker  . Smokeless tobacco: Never Used  Substance and Sexual Activity  . Alcohol use: No  . Drug use: No  . Sexual activity: Not on file  Other Topics Concern  . Not on file  Social History Narrative  . Not on file   Social Determinants of Health   Financial Resource Strain: Not on file  Food Insecurity: Not on file  Transportation Needs: Not on file  Physical Activity: Not on file  Stress: Not on file  Social Connections: Not on file     Family History: The patient's family history includes CAD in her sister; Cervical cancer in her mother; Heart attack in her father; Heart disease in her brother. There is no history of Colon cancer.  ROS:   Please see the history of present illness.    ROS  All other systems reviewed and negative.    EKGs/Labs/Other Studies Reviewed:    The following studies were reviewed today: 2D echo, Lexiscan myoview  EKG:  EKG is  ordered today.  The ekg ordered today demonstrates NSR  Recent Labs: 02/14/2021: TSH 0.160 02/16/2021: ALT 17; Magnesium 2.0 02/17/2021: BUN 21; Creatinine, Ser 0.74; Hemoglobin 11.6; Platelets 301; Potassium 3.9; Sodium 133   Recent Lipid Panel  Component Value Date/Time   CHOL 171 02/16/2021 0404   TRIG 111 02/16/2021 0404   HDL 45 02/16/2021 0404   CHOLHDL 3.8 02/16/2021 0404   VLDL 22 02/16/2021 0404   LDLCALC 104 (H) 02/16/2021 0404    CHA2DS2-VASc Score = 5 [CHF History: No, HTN History: Yes, Diabetes History: No, Stroke History: No, Vascular Disease History: Yes, Age Score: 2, Gender Score: 1].  Therefore, the patient's annual risk of stroke is 7.2 %.        Physical Exam:    VS:  BP (!) 150/70 (BP Location: Left Arm, Patient Position: Sitting, Cuff Size: Normal)   Pulse 75   Ht _0  (1.575 m)   Wt 175 lb (79.4 kg)   SpO2 96%   BMI 32.01 kg/m     Wt Readings from Last 3 Encounters:  03/27/21 175 lb (79.4 kg)  02/15/21 177 lb (80.3 kg)  04/09/20 177 lb (80.3 kg)     GEN:  Well nourished, well developed in no acute distress HEENT: Normal NECK: No JVD; No carotid bruits LYMPHATICS: No lymphadenopathy CARDIAC: RRR, no murmurs, rubs, gallops RESPIRATORY:  Clear to auscultation without rales, wheezing or rhonchi  ABDOMEN: Soft, non-tender, non-distended MUSCULOSKELETAL:  No edema; No deformity  SKIN: Warm and dry NEUROLOGIC:  Alert and oriented x 3 PSYCHIATRIC:  Normal affect   ASSESSMENT:    1. Persistent atrial fibrillation ([place])   2. Coronary artery disease due to lipid rich plaque   3. Essential hypertension   4. Pure hypercholesterolemia   5. DCM (dilated cardiomyopathy) ([place])   6. Coronary artery disease involving native coronary artery of native heart without angina pectoris    PLAN:    In order of problems listed  above:  Persistent atrial fibrillation -found to be in new onset atrial fibrillation in march in setting of hypokalemia -now on apixaban 326m BID for CHADS2VASC score of 5. -denies any bleeding problems -she is in NSR today -she has not missed any doses of her DOAC -continue Atenolol 1056mdaily and APixaban 26m74mID -SCr stable at 0.8 and Hbg 12.6 in March -repeat BMET today  ASCAD -30% mLCx by cath in 2012 -[place] no ischemia in March 2022 -no ASA due to DOAC -continue BB and statin -she had 1 episode of vague chest discomfort lasting 5 minutes since discharge  HTN -BP borderline controlled on exam -continue Atenolol 100m62mily, HCTZ 12.26mg 48mly, Irbesartan 300mg 88my -check BMET  HLD -LDL goal < 70 -started on Atorvastatin 40mg a83mst OV -check FLP and ALT  DCM -? Tachy mediated due to afib with RVR -lexiscan myoview with no ischemia -continue ARB and BB     Medication Adjustments/Labs and Tests Ordered: Current medicines are reviewed at length with the patient today.  Concerns regarding medicines are outlined above.  No orders of the defined types were placed in this encounter.  No orders of the defined types were placed in this encounter.   Signed, Shoshannah Faubert TFransico Him/27/2022 9:59 AM    [facility]

## 2021-03-27 NOTE — Patient Instructions (Signed)
Medication Instructions:  Your physician recommends that you continue on your current medications as directed. Please refer to the Current Medication list given to you today.  *If you need a refill on your cardiac medications before your next appointment, please call your pharmacy*   Lab Work: TODAY: Fasting lipids and CMET If you have labs (blood work) drawn today and your tests are completely normal, you will receive your results only by: Marland Kitchen MyChart Message (if you have MyChart) OR . A paper copy in the mail If you have any lab test that is abnormal or we need to change your treatment, we will call you to review the results.  Follow-Up: At [facility], you and your health needs are our priority.  As part of our continuing mission to provide you with exceptional heart care, we have created designated Provider Care Teams.  These Care Teams include your primary Cardiologist (physician) and Advanced Practice Providers (APPs -  Physician Assistants and Nurse Practitioners) who all work together to provide you with the care you need, when you need it.  Your next appointment:   6 month(s)  The format for your next appointment:   In Person  Provider:   You may see Fransico Him, MD or one of the following Advanced Practice Providers on your designated Care Team:    Melina Copa, PA-C  Ermalinda Barrios, PA-C

## 2021-03-28 ENCOUNTER — Telehealth: Payer: Self-pay

## 2021-03-28 DIAGNOSIS — G4733 Obstructive sleep apnea (adult) (pediatric): Secondary | ICD-10-CM | POA: Diagnosis not present

## 2021-03-28 DIAGNOSIS — E7800 Pure hypercholesterolemia, unspecified: Secondary | ICD-10-CM

## 2021-03-28 MED ORDER — ATORVASTATIN CALCIUM 40 MG PO TABS: 40.0000 mg | ORAL_TABLET | Freq: Every day | ORAL | 3 refills | Status: DC

## 2021-03-28 NOTE — Telephone Encounter (Signed)
Spoke with the patient and she will increase atorvastatin to 40 mg daily. Repeat lab work has been scheduled.

## 2021-03-28 NOTE — Telephone Encounter (Signed)
Left message for patient to call back  

## 2021-03-28 NOTE — Telephone Encounter (Signed)
-----   Message from Sueanne Margarita, MD sent at 03/28/2021  9:36 AM EDT ----- Start taking 40mg  daily and repeat FLP and ALT In 6 weeks ----- Message ----- From: Antonieta Iba, RN Sent: 03/28/2021   9:35 AM EDT To: Sueanne Margarita, MD  The patient has been notified of the result and verbalized understanding.  All questions (if any) were answered. Antonieta Iba, RN 03/28/2021 9:35 AM   Patient states that she is only taking her atorvastatin 40 mg once per week rather than every day.

## 2021-03-28 NOTE — Telephone Encounter (Signed)
  Pt c/o medication issue:  1. Name of Medication:   atorvastatin (LIPITOR) 40 MG tablet   2. How are you currently taking this medication (dosage and times per day)? Take 1 tablet (40 mg total) by mouth daily.  3. Are you having a reaction (difficulty breathing--STAT)?   4. What is your medication issue? Pt is calling back, she picked up the medication however she didn't know that Dr. Radford Pax changed it to Atorvastatin. She said she's been taking rosuvastatin 5 mg 1 tablet a week, she said if Dr. Radford Pax would like to increase her meds she wanted to stay on rosuvastatin. She said the Atorvastatin is generic of Lipitor and she heard a lot from her friends who got severe reaction to it, she doesn't want to take the Atorvastatin

## 2021-03-29 ENCOUNTER — Encounter: Payer: Self-pay | Admitting: Cardiology

## 2021-03-29 MED ORDER — ROSUVASTATIN CALCIUM 5 MG PO TABS: 5.0000 mg | ORAL_TABLET | Freq: Every day | ORAL | 3 refills | Status: DC

## 2021-03-29 NOTE — Telephone Encounter (Signed)
Patient mentioned that she wants to stay on her current medication and not to take LIPITOR.

## 2021-03-29 NOTE — Telephone Encounter (Signed)
Medication list updated. D/c'd lipitor, called in Crestor 5 mg daily to [facility]. Repeat fasting labs confirmed 05/13/21. The patient was grateful for call and agrees with plan.

## 2021-03-29 NOTE — Addendum Note (Signed)
Addended by: Harland German A on: 03/29/2021 11:54 AM   Modules accepted: Orders

## 2021-03-29 NOTE — Telephone Encounter (Signed)
Returned patient's call. She reports she has been taking Crestor 5 mg once weekly but atorvastatin 40 mg daily was refilled for her at her recent visit with Dr. Radford Pax. She is adamant Dr. Radford Pax said she would call in Crestor 5 mg daily. She refuses to take atorvastatin and has never taken it.  She requests correct Rx called to [facility].  She understands order will need to be confirmed with Dr. Radford Pax.

## 2021-03-29 NOTE — Telephone Encounter (Signed)
This encounter was created in error - please disregard.

## 2021-04-30 DIAGNOSIS — J45909 Unspecified asthma, uncomplicated: Secondary | ICD-10-CM | POA: Diagnosis not present

## 2021-04-30 DIAGNOSIS — I10 Essential (primary) hypertension: Secondary | ICD-10-CM | POA: Diagnosis not present

## 2021-04-30 DIAGNOSIS — G4700 Insomnia, unspecified: Secondary | ICD-10-CM | POA: Diagnosis not present

## 2021-04-30 DIAGNOSIS — K219 Gastro-esophageal reflux disease without esophagitis: Secondary | ICD-10-CM | POA: Diagnosis not present

## 2021-04-30 DIAGNOSIS — F324 Major depressive disorder, single episode, in partial remission: Secondary | ICD-10-CM | POA: Diagnosis not present

## 2021-04-30 DIAGNOSIS — I4891 Unspecified atrial fibrillation: Secondary | ICD-10-CM | POA: Diagnosis not present

## 2021-04-30 DIAGNOSIS — F329 Major depressive disorder, single episode, unspecified: Secondary | ICD-10-CM | POA: Diagnosis not present

## 2021-04-30 DIAGNOSIS — E7800 Pure hypercholesterolemia, unspecified: Secondary | ICD-10-CM | POA: Diagnosis not present

## 2021-04-30 DIAGNOSIS — E039 Hypothyroidism, unspecified: Secondary | ICD-10-CM | POA: Diagnosis not present

## 2021-05-08 ENCOUNTER — Telehealth: Payer: Self-pay | Admitting: Cardiology

## 2021-05-08 DIAGNOSIS — I4891 Unspecified atrial fibrillation: Secondary | ICD-10-CM | POA: Diagnosis not present

## 2021-05-08 DIAGNOSIS — Z01419 Encounter for gynecological examination (general) (routine) without abnormal findings: Secondary | ICD-10-CM | POA: Diagnosis not present

## 2021-05-08 DIAGNOSIS — I10 Essential (primary) hypertension: Secondary | ICD-10-CM | POA: Diagnosis not present

## 2021-05-08 DIAGNOSIS — Z780 Asymptomatic menopausal state: Secondary | ICD-10-CM | POA: Diagnosis not present

## 2021-05-08 NOTE — Telephone Encounter (Signed)
Patient c/o Palpitations:  High priority if patient c/o lightheadedness, shortness of breath, or chest pain  1) How long have you had palpitations/irregular HR/ Afib? Are you having the symptoms now? yes  2) Are you currently experiencing lightheadedness, SOB or CP? unknown  3) Do you have a history of afib (atrial fibrillation) or irregular heart rhythm? yes  4) Have you checked your BP or HR? (document readings if available): 120/78  HR 107  5) Are you experiencing any other symptoms?

## 2021-05-08 NOTE — Telephone Encounter (Signed)
Spoke with the patient who states that she started feeling bad on Saturday and could tell that she was in A fib. She states that she was shaky and weak. Reports that she could feel palpitations. She reports SOB with exertion. She states that each day since Saturday she has felt better. Today she feels okay but feels that she is still in Afib.  She is adherent with atenolol 100 mg daily and Eliquis 5 mg BID.

## 2021-05-08 NOTE — Telephone Encounter (Signed)
Spoke with Minette Brine from Dr. Harvie Bridge office.  She reports that pt is in Afib and has been since Saturday or Sunday. No EKG was performed by their office. 120/78-107 are vitals per phone note.  I also spoke with Dr. Harvie Bridge who reports that she doesn't want anything to happen to the patient.  I informed Minette Brine that pt can leave the office and Dr. Theodosia Blender nurse will call her.

## 2021-05-08 NOTE — Telephone Encounter (Signed)
Left message for patient to call back  

## 2021-05-09 NOTE — Telephone Encounter (Signed)
Spoke with Dawn at the A Fib clinic. Patient has been scheduled for Monday 6/13 at 3pm. Patient is aware.

## 2021-05-13 ENCOUNTER — Other Ambulatory Visit: Payer: PPO

## 2021-05-13 ENCOUNTER — Encounter (HOSPITAL_COMMUNITY): Payer: Self-pay | Admitting: Physician Assistant

## 2021-05-13 ENCOUNTER — Ambulatory Visit (HOSPITAL_COMMUNITY)
Admission: RE | Admit: 2021-05-13 | Discharge: 2021-05-13 | Disposition: A | Discharge status: Home / Self Care | Payer: PPO | Source: Ambulatory Visit | Attending: Physician Assistant | Admitting: Physician Assistant

## 2021-05-13 ENCOUNTER — Other Ambulatory Visit: Payer: Self-pay

## 2021-05-13 VITALS — BP 146/76 | HR 106 | Ht 62.0 in | Wt 176.4 lb

## 2021-05-13 DIAGNOSIS — Z9989 Dependence on other enabling machines and devices: Secondary | ICD-10-CM | POA: Diagnosis not present

## 2021-05-13 DIAGNOSIS — E7800 Pure hypercholesterolemia, unspecified: Secondary | ICD-10-CM | POA: Diagnosis not present

## 2021-05-13 DIAGNOSIS — G4733 Obstructive sleep apnea (adult) (pediatric): Secondary | ICD-10-CM | POA: Diagnosis not present

## 2021-05-13 DIAGNOSIS — Z79899 Other long term (current) drug therapy: Secondary | ICD-10-CM | POA: Insufficient documentation

## 2021-05-13 DIAGNOSIS — I4819 Other persistent atrial fibrillation: Secondary | ICD-10-CM | POA: Insufficient documentation

## 2021-05-13 DIAGNOSIS — E669 Obesity, unspecified: Secondary | ICD-10-CM | POA: Insufficient documentation

## 2021-05-13 DIAGNOSIS — E785 Hyperlipidemia, unspecified: Secondary | ICD-10-CM | POA: Diagnosis not present

## 2021-05-13 DIAGNOSIS — Z8249 Family history of ischemic heart disease and other diseases of the circulatory system: Secondary | ICD-10-CM | POA: Insufficient documentation

## 2021-05-13 DIAGNOSIS — Z7901 Long term (current) use of anticoagulants: Secondary | ICD-10-CM | POA: Insufficient documentation

## 2021-05-13 DIAGNOSIS — Z79890 Hormone replacement therapy (postmenopausal): Secondary | ICD-10-CM | POA: Diagnosis not present

## 2021-05-13 DIAGNOSIS — I2510 Atherosclerotic heart disease of native coronary artery without angina pectoris: Secondary | ICD-10-CM | POA: Diagnosis not present

## 2021-05-13 DIAGNOSIS — Z6832 Body mass index (BMI) 32.0-32.9, adult: Secondary | ICD-10-CM | POA: Insufficient documentation

## 2021-05-13 DIAGNOSIS — E039 Hypothyroidism, unspecified: Secondary | ICD-10-CM | POA: Insufficient documentation

## 2021-05-13 DIAGNOSIS — I10 Essential (primary) hypertension: Secondary | ICD-10-CM | POA: Insufficient documentation

## 2021-05-13 DIAGNOSIS — D6869 Other thrombophilia: Secondary | ICD-10-CM | POA: Diagnosis not present

## 2021-05-13 LAB — LIPID PANEL
Chol/HDL Ratio: 4 ratio (ref 0.0–4.4)
Cholesterol, Total: 190 mg/dL (ref 100–199)
HDL: 47 mg/dL (ref >39)
LDL Chol Calc (NIH): 99 mg/dL (ref 0–99)
Triglycerides: 260 mg/dL — ABNORMAL HIGH (ref 0–149)
VLDL Cholesterol Cal: 44 mg/dL — ABNORMAL HIGH (ref 5–40)

## 2021-05-13 LAB — ALT: ALT: 14 IU/L (ref 0–32)

## 2021-05-13 MED ORDER — DILTIAZEM HCL 30 MG PO TABS: ORAL_TABLET | ORAL | 1 refills | Status: DC

## 2021-05-13 NOTE — Progress Notes (Signed)
Primary Care Physician: [name], MD Primary Cardiologist: Dr Radford Pax Primary Electrophysiologist: none Referring Physician: Dr Marzetta Merino is a 80 y.o. female with a history of CAD, HTN, OSA, HLD, hypothyroidism, atrial fibrillation who presents for consultation in the [facility]. The patient was initially diagnosed with atrial fibrillation 01/2021 after presenting to the ED with symptoms of intermittent CP. She was in rate controlled afib. Echo showed EF 45-50% with hypokinetic basal segments. Lexiscan did not show ischemia. She was discharged in rate controlled afib with plan for outpatient DCCV but converted spontaneously on her own. Patient is on Eliquis for a CHADS2VASC score of 5. She reports that she went back into afib since 05/04/21 with symptoms of palpitations and heart racing. There were no specific triggers that she could identify. She is compliant with CPAP therapy.   Today, she denies symptoms of chest pain, shortness of breath, orthopnea, PND, lower extremity edema, dizziness, presyncope, syncope, snoring, daytime somnolence, bleeding, or neurologic sequela. The patient is tolerating medications without difficulties and is otherwise without complaint today.    Atrial Fibrillation Risk Factors:  she does have symptoms or diagnosis of sleep apnea. she is compliant with CPAP therapy. she does have a history of rheumatic fever. she does not have a history of alcohol use. The patient does not have a history of early familial atrial fibrillation or other arrhythmias.  she has a BMI of Body mass index is 32.26 kg/m.Marland Kitchen Filed Weights   05/13/21 1503  Weight: 80 kg    Family History  Problem Relation Age of Onset   Cervical cancer Mother    CAD Sister    Heart attack Father        Died in early 62s with MI   Heart disease Brother    Colon cancer Neg Hx      Atrial Fibrillation Management history:  Previous antiarrhythmic  drugs: none Previous cardioversions: none Previous ablations: none CHADS2VASC score: 5 Anticoagulation history: Eliquis   Past Medical History:  Diagnosis Date   Benign essential HTN    CAD (coronary artery disease), native coronary artery    Cath 2012 with 30% LCx   DCM (dilated cardiomyopathy) ([place])    ? tachy mediated from afib with RVR.  EF 45-50% by echo 01/2021.  Lexiscan myoview with no ischemia   Diverticulosis    Hiatal hernia    HLD (hyperlipidemia)    Persistent atrial fibrillation (HCC)    On DOAC   S/P laparoscopic cholecystectomy 07/28/2011   23 years ago   Thyroid goiter    Past Surgical History:  Procedure Laterality Date   child birth     x3   CHOLECYSTECTOMY      Current Outpatient Medications  Medication Sig Dispense Refill   albuterol (VENTOLIN HFA) 108 (90 Base) MCG/ACT inhaler Inhale 2 puffs into the lungs every 4 (four) hours as needed for wheezing or shortness of breath.     ALPRAZolam (XANAX) 0.5 MG tablet Take 0.5 mg by mouth daily as needed for anxiety.     apixaban (ELIQUIS) 5 MG TABS tablet Take 1 tablet (5 mg total) by mouth 2 (two) times daily. 60 tablet 2   atenolol (TENORMIN) 100 MG tablet Take 100 mg by mouth daily.     b complex vitamins tablet Take 1 tablet by mouth daily.     Biotin 1 MG CAPS Take 1 capsule by mouth daily.     calcium carbonate (OS-CAL) 600  MG TABS Take 600 mg by mouth 2 (two) times daily with a meal.     cyclobenzaprine (FLEXERIL) 10 MG tablet Take 5-10 mg by mouth 3 (three) times daily as needed for muscle spasms.     hydrochlorothiazide (HYDRODIURIL) 12.5 MG tablet Take 12.5 mg by mouth daily.     HYDROcodone-acetaminophen (NORCO/VICODIN) 5-325 MG tablet Take 1 tablet by mouth every 6 (six) hours as needed for moderate pain.     irbesartan (AVAPRO) 300 MG tablet Take 300 mg by mouth every morning.     levothyroxine (SYNTHROID) 25 MCG tablet Take 25 mcg by mouth every morning.     Multiple Vitamin (MULTIVITAMIN) tablet  Take 1 tablet by mouth daily.     omeprazole (PRILOSEC OTC) 20 MG tablet Take 20 mg by mouth daily.     predniSONE (DELTASONE) 20 MG tablet Take 20 mg by mouth daily with breakfast.     promethazine (PHENERGAN) 25 MG tablet Take 25 mg by mouth every 6 (six) hours as needed for nausea or vomiting.     rosuvastatin (CRESTOR) 5 MG tablet Take 1 tablet (5 mg total) by mouth daily. 90 tablet 3   Selenium 100 MCG TABS Take 1 tablet by mouth daily.     valACYclovir (VALTREX) 1000 MG tablet Take 1,000 mg by mouth daily as needed (fever blisters).     vitamin C (ASCORBIC ACID) 500 MG tablet Take 500 mg by mouth daily.     vitamin E 180 MG (400 UNITS) capsule Take 400 Units by mouth daily.     No current facility-administered medications for this encounter.    Allergies  Allergen Reactions   Codeine     REACTION: nausea   Duloxetine Diarrhea   Lyrica [Pregabalin] Other (See Comments)    Made pain in legs and feet worse.    Social History   Socioeconomic History   Marital status: Widowed    Spouse name: Not on file   Number of children: Not on file   Years of education: Not on file   Highest education level: Not on file  Occupational History   Occupation: retired  Tobacco Use   Smoking status: Never   Smokeless tobacco: Never  Substance and Sexual Activity   Alcohol use: No   Drug use: No    Comment: CBD gummies   Sexual activity: Not on file  Other Topics Concern   Not on file  Social History Narrative   Not on file   Social Determinants of Health   Financial Resource Strain: Not on file  Food Insecurity: Not on file  Transportation Needs: Not on file  Physical Activity: Not on file  Stress: Not on file  Social Connections: Not on file  Intimate Partner Violence: Not on file     ROS- All systems are reviewed and negative except as per the HPI above.  Physical Exam: Vitals:   05/13/21 1503  BP: (!) 146/76  Pulse: (!) 106  Weight: 80 kg  Height: 5\' 2"  (1.575 m)     GEN- The patient is a well appearing obese elderly female, alert and oriented x 3 today.   Head- normocephalic, atraumatic Eyes-  Sclera clear, conjunctiva pink Ears- hearing intact Oropharynx- clear Neck- supple  Lungs- Clear to ausculation bilaterally, normal work of breathing Heart- irregular rate and rhythm, no murmurs, rubs or gallops  GI- soft, NT, ND, + BS Extremities- no clubbing, cyanosis, or edema MS- no significant deformity or atrophy Skin- no rash or lesion  Psych- euthymic mood, full affect Neuro- strength and sensation are intact  Wt Readings from Last 3 Encounters:  05/13/21 80 kg  03/27/21 79.4 kg  02/15/21 80.3 kg    EKG today demonstrates  Afib, PVC Vent. rate 106 BPM PR interval * ms QRS duration 78 ms QT/QTcB 340/451 ms  Echo 02/15/21 demonstrated  1. Left ventricular ejection fraction, by estimation, is 45 to 50%. The  left ventricle has mildly decreased function. Left ventricular endocardial  border not optimally defined to evaluate regional wall motion. Basal  segments are hypokinetic with best  preseved function in mid and apex. Left ventricular diastolic parameters  are indeterminate.   2. Right ventricular systolic function is mildly reduced. The right  ventricular size is normal. Tricuspid regurgitation signal is inadequate  for assessing PA pressure.   3. Left atrial size was mildly dilated.   4. Right atrial size was mildly dilated.   5. The mitral valve is normal in structure. Trivial mitral valve  regurgitation. No evidence of mitral stenosis.   6. The aortic valve is grossly normal. There is mild calcification of the  aortic valve. Aortic valve regurgitation is not visualized. No aortic  stenosis is present.   7. The inferior vena cava is normal in size with greater than 50%  respiratory variability, suggesting right atrial pressure of 3 mmHg.   Epic records are reviewed at length today  CHA2DS2-VASc Score = 5  The patient's  score is based upon: CHF History: No HTN History: Yes Diabetes History: No Stroke History: No Vascular Disease History: Yes Age Score: 2 Gender Score: 1      ASSESSMENT AND PLAN: 1. Persistent Atrial Fibrillation (ICD10:  I48.19) The patient's CHA2DS2-VASc score is 5, indicating a 7.2% annual risk of stroke.   General education about afib provided and questions answered. We also discussed her stroke risk and the risks and benefits of anticoagulation. We discussed therapeutic options including AAD, DCCV, and ablation.  She did not want to start AAD or consider ablation at this time. She may be going in and out of afib given she was in SR at her last visit.  Will start diltiazem 30 mg PRN q 4 hours for heart racing. Reevaluate in 2 weeks. If persistent, can consider DCCV.  2. Secondary Hypercoagulable State (ICD10:  D68.69) The patient is at significant risk for stroke/thromboembolism based upon her CHA2DS2-VASc Score of 5.  Continue Apixaban (Eliquis).   3. Obesity Body mass index is 32.26 kg/m. Lifestyle modification was discussed at length including regular exercise and weight reduction.  4. Obstructive sleep apnea The importance of adequate treatment of sleep apnea was discussed today in order to improve our ability to maintain sinus rhythm long term. Patient reports compliance with CPAP therapy.  5. CAD Non obstructive by [place] Lexiscan did not show ischemia. Followed by Dr Radford Pax.  6. HTN Stable, no changes today.   Follow up in the AF clinic in 2 weeks.    [facility] [address] [place], [place] [address] [phone] 05/13/2021 3:31 PM

## 2021-05-13 NOTE — Patient Instructions (Signed)
Cardizem 30mg  -- take 1 tablet every 4 hours AS NEEDED for AFIB heart rate >100 as long as top of blood pressure >100.

## 2021-05-14 ENCOUNTER — Telehealth: Payer: Self-pay | Admitting: Cardiology

## 2021-05-14 DIAGNOSIS — E7800 Pure hypercholesterolemia, unspecified: Secondary | ICD-10-CM

## 2021-05-14 MED ORDER — ROSUVASTATIN CALCIUM 10 MG PO TABS: 10.0000 mg | ORAL_TABLET | Freq: Every day | ORAL | 3 refills | Status: DC

## 2021-05-14 NOTE — Telephone Encounter (Signed)
Pt is returning call for results from earlier today. Please advise

## 2021-05-14 NOTE — Telephone Encounter (Signed)
Follow Up:      Pt is returning Carlyle's call, concerning her lab results.

## 2021-05-14 NOTE — Telephone Encounter (Signed)
Tabitha Margarita, MD  05/13/2021  5:50 PM EDT      LDL and TAGs not at goal - increase Crestor to 10mg  daily and repeat FLP andALT in 6 weeks  Left message for patient to call back,

## 2021-05-14 NOTE — Telephone Encounter (Signed)
The patient has been notified of the result and verbalized understanding.  All questions (if any) were answered. Antonieta Iba, RN 05/14/2021 4:12 PM  Patient will increase Crestor to 10 mg daily and repeat lab work in 6 weeks.

## 2021-05-22 DIAGNOSIS — E039 Hypothyroidism, unspecified: Secondary | ICD-10-CM | POA: Diagnosis not present

## 2021-05-22 DIAGNOSIS — K219 Gastro-esophageal reflux disease without esophagitis: Secondary | ICD-10-CM | POA: Diagnosis not present

## 2021-05-22 DIAGNOSIS — F329 Major depressive disorder, single episode, unspecified: Secondary | ICD-10-CM | POA: Diagnosis not present

## 2021-05-22 DIAGNOSIS — J45909 Unspecified asthma, uncomplicated: Secondary | ICD-10-CM | POA: Diagnosis not present

## 2021-05-22 DIAGNOSIS — I10 Essential (primary) hypertension: Secondary | ICD-10-CM | POA: Diagnosis not present

## 2021-05-22 DIAGNOSIS — F324 Major depressive disorder, single episode, in partial remission: Secondary | ICD-10-CM | POA: Diagnosis not present

## 2021-05-22 DIAGNOSIS — E7800 Pure hypercholesterolemia, unspecified: Secondary | ICD-10-CM | POA: Diagnosis not present

## 2021-05-22 DIAGNOSIS — G4700 Insomnia, unspecified: Secondary | ICD-10-CM | POA: Diagnosis not present

## 2021-05-22 DIAGNOSIS — I4891 Unspecified atrial fibrillation: Secondary | ICD-10-CM | POA: Diagnosis not present

## 2021-05-23 DIAGNOSIS — M5416 Radiculopathy, lumbar region: Secondary | ICD-10-CM | POA: Diagnosis not present

## 2021-05-27 ENCOUNTER — Ambulatory Visit (HOSPITAL_COMMUNITY)
Admission: RE | Admit: 2021-05-27 | Discharge: 2021-05-27 | Disposition: A | Discharge status: Home / Self Care | Payer: PPO | Source: Ambulatory Visit | Attending: Physician Assistant | Admitting: Physician Assistant

## 2021-05-27 ENCOUNTER — Other Ambulatory Visit: Payer: Self-pay

## 2021-05-27 VITALS — BP 158/84 | HR 85 | Ht 62.0 in | Wt 178.2 lb

## 2021-05-27 DIAGNOSIS — I4819 Other persistent atrial fibrillation: Secondary | ICD-10-CM | POA: Diagnosis not present

## 2021-05-27 DIAGNOSIS — I2510 Atherosclerotic heart disease of native coronary artery without angina pectoris: Secondary | ICD-10-CM | POA: Insufficient documentation

## 2021-05-27 DIAGNOSIS — I10 Essential (primary) hypertension: Secondary | ICD-10-CM | POA: Diagnosis not present

## 2021-05-27 DIAGNOSIS — Z79899 Other long term (current) drug therapy: Secondary | ICD-10-CM | POA: Insufficient documentation

## 2021-05-27 DIAGNOSIS — G4733 Obstructive sleep apnea (adult) (pediatric): Secondary | ICD-10-CM | POA: Diagnosis not present

## 2021-05-27 DIAGNOSIS — D6869 Other thrombophilia: Secondary | ICD-10-CM | POA: Diagnosis not present

## 2021-05-27 DIAGNOSIS — Z6832 Body mass index (BMI) 32.0-32.9, adult: Secondary | ICD-10-CM | POA: Diagnosis not present

## 2021-05-27 DIAGNOSIS — Z7901 Long term (current) use of anticoagulants: Secondary | ICD-10-CM | POA: Diagnosis not present

## 2021-05-27 NOTE — Progress Notes (Signed)
Primary Care Physician: [name], MD Primary Cardiologist: Dr Radford Pax Primary Electrophysiologist: none Referring Physician: Dr Marzetta Merino is a 80 y.o. female with a history of CAD, HTN, OSA, HLD, hypothyroidism, atrial fibrillation who presents for follow up in the [facility]. The patient was initially diagnosed with atrial fibrillation 01/2021 after presenting to the ED with symptoms of intermittent CP. She was in rate controlled afib. Echo showed EF 45-50% with hypokinetic basal segments. Lexiscan did not show ischemia. She was discharged in rate controlled afib with plan for outpatient DCCV but converted spontaneously on her own. Patient is on Eliquis for a CHADS2VASC score of 5. She reports that she went back into afib since 05/04/21 with symptoms of palpitations and heart racing. There were no specific triggers that she could identify. She is compliant with CPAP therapy.   On follow up today, patient reports that she has felt like she was in afib since her last visit. She does feel her heart racing at times. She took the PRN diltiazem with minimal relief. She denies any bleeding issues on anticoagulation. She is in SR today.   Today, she denies symptoms of shortness of breath, orthopnea, PND, lower extremity edema, dizziness, presyncope, syncope, snoring, daytime somnolence, bleeding, or neurologic sequela. The patient is tolerating medications without difficulties and is otherwise without complaint today.    Atrial Fibrillation Risk Factors:  she does have symptoms or diagnosis of sleep apnea. she is compliant with CPAP therapy. she does have a history of rheumatic fever. she does not have a history of alcohol use. The patient does not have a history of early familial atrial fibrillation or other arrhythmias.  she has a BMI of Body mass index is 32.59 kg/m.Marland Kitchen Filed Weights   05/27/21 1409  Weight: 80.8 kg     Family History  Problem  Relation Age of Onset   Cervical cancer Mother    CAD Sister    Heart attack Father        Died in early 43s with MI   Heart disease Brother    Colon cancer Neg Hx      Atrial Fibrillation Management history:  Previous antiarrhythmic drugs: none Previous cardioversions: none Previous ablations: none CHADS2VASC score: 5 Anticoagulation history: Eliquis   Past Medical History:  Diagnosis Date   Benign essential HTN    CAD (coronary artery disease), native coronary artery    Cath 2012 with 30% LCx   DCM (dilated cardiomyopathy) ([place])    ? tachy mediated from afib with RVR.  EF 45-50% by echo 01/2021.  Lexiscan myoview with no ischemia   Diverticulosis    Hiatal hernia    HLD (hyperlipidemia)    Persistent atrial fibrillation (HCC)    On DOAC   S/P laparoscopic cholecystectomy 07/28/2011   23 years ago   Thyroid goiter    Past Surgical History:  Procedure Laterality Date   child birth     x3   CHOLECYSTECTOMY      Current Outpatient Medications  Medication Sig Dispense Refill   albuterol (VENTOLIN HFA) 108 (90 Base) MCG/ACT inhaler Inhale 2 puffs into the lungs every 4 (four) hours as needed for wheezing or shortness of breath.     ALPRAZolam (XANAX) 0.5 MG tablet Take 0.5 mg by mouth daily as needed for anxiety.     apixaban (ELIQUIS) 5 MG TABS tablet Take 1 tablet (5 mg total) by mouth 2 (two) times daily. 60 tablet 2  atenolol (TENORMIN) 100 MG tablet Take 100 mg by mouth daily.     b complex vitamins tablet Take 1 tablet by mouth daily.     Biotin 1 MG CAPS Take 1 capsule by mouth daily.     calcium carbonate (OS-CAL) 600 MG TABS Take 600 mg by mouth 2 (two) times daily with a meal.     cyclobenzaprine (FLEXERIL) 10 MG tablet Take 5-10 mg by mouth 3 (three) times daily as needed for muscle spasms.     diltiazem (CARDIZEM) 30 MG tablet Take 1 tablet every 4 hours AS NEEDED for afib heart rate >100 30 tablet 1   hydrochlorothiazide (HYDRODIURIL) 12.5 MG tablet Take  12.5 mg by mouth daily.     HYDROcodone-acetaminophen (NORCO/VICODIN) 5-325 MG tablet Take 1 tablet by mouth every 6 (six) hours as needed for moderate pain.     irbesartan (AVAPRO) 300 MG tablet Take 300 mg by mouth every morning.     levothyroxine (SYNTHROID) 25 MCG tablet Take 25 mcg by mouth every morning.     Multiple Vitamin (MULTIVITAMIN) tablet Take 1 tablet by mouth daily.     omeprazole (PRILOSEC OTC) 20 MG tablet Take 20 mg by mouth daily.     promethazine (PHENERGAN) 25 MG tablet Take 25 mg by mouth every 6 (six) hours as needed for nausea or vomiting.     rosuvastatin (CRESTOR) 5 MG tablet rosuvastatin 5 mg tablet  TAKE 1 TABLET (5 MG TOTAL) BY MOUTH DAILY.     Selenium 100 MCG TABS Take 1 tablet by mouth daily.     valACYclovir (VALTREX) 1000 MG tablet Take 1,000 mg by mouth daily as needed (fever blisters).     vitamin C (ASCORBIC ACID) 500 MG tablet Take 500 mg by mouth daily.     No current facility-administered medications for this encounter.    Allergies  Allergen Reactions   Codeine     REACTION: nausea   Duloxetine Diarrhea   Lyrica [Pregabalin] Other (See Comments)    Made pain in legs and feet worse.    Social History   Socioeconomic History   Marital status: Widowed    Spouse name: Not on file   Number of children: Not on file   Years of education: Not on file   Highest education level: Not on file  Occupational History   Occupation: retired  Tobacco Use   Smoking status: Never   Smokeless tobacco: Never  Substance and Sexual Activity   Alcohol use: No   Drug use: No    Comment: CBD gummies   Sexual activity: Not on file  Other Topics Concern   Not on file  Social History Narrative   Not on file   Social Determinants of Health   Financial Resource Strain: Not on file  Food Insecurity: Not on file  Transportation Needs: Not on file  Physical Activity: Not on file  Stress: Not on file  Social Connections: Not on file  Intimate Partner  Violence: Not on file     ROS- All systems are reviewed and negative except as per the HPI above.  Physical Exam: Vitals:   05/27/21 1409 05/27/21 1506  BP: (!) 180/74 (!) 158/84  Pulse: 85   Weight: 80.8 kg   Height: 5\' 2"  (1.575 m)     GEN- The patient is a well appearing obese elderly female, alert and oriented x 3 today.   HEENT-head normocephalic, atraumatic, sclera clear, conjunctiva pink, hearing intact, trachea midline. Lungs- Clear to  ausculation bilaterally, normal work of breathing Heart- Regular rate and rhythm, occasional ectopic beat, no murmurs, rubs or gallops  GI- soft, NT, ND, + BS Extremities- no clubbing, cyanosis, or edema MS- no significant deformity or atrophy Skin- no rash or lesion Psych- euthymic mood, full affect Neuro- strength and sensation are intact   Wt Readings from Last 3 Encounters:  05/27/21 80.8 kg  05/13/21 80 kg  03/27/21 79.4 kg    EKG today demonstrates  SR with PACs Vent. rate 85 BPM PR interval * ms QRS duration 78 ms QT/QTcB 374/445 ms  Echo 02/15/21 demonstrated  1. Left ventricular ejection fraction, by estimation, is 45 to 50%. The  left ventricle has mildly decreased function. Left ventricular endocardial  border not optimally defined to evaluate regional wall motion. Basal  segments are hypokinetic with best  preseved function in mid and apex. Left ventricular diastolic parameters  are indeterminate.   2. Right ventricular systolic function is mildly reduced. The right  ventricular size is normal. Tricuspid regurgitation signal is inadequate  for assessing PA pressure.   3. Left atrial size was mildly dilated.   4. Right atrial size was mildly dilated.   5. The mitral valve is normal in structure. Trivial mitral valve  regurgitation. No evidence of mitral stenosis.   6. The aortic valve is grossly normal. There is mild calcification of the  aortic valve. Aortic valve regurgitation is not visualized. No aortic   stenosis is present.   7. The inferior vena cava is normal in size with greater than 50%  respiratory variability, suggesting right atrial pressure of 3 mmHg.   Epic records are reviewed at length today  CHA2DS2-VASc Score = 5  The patient's score is based upon: CHF History: No HTN History: Yes Diabetes History: No Stroke History: No Vascular Disease History: Yes Age Score: 2 Gender Score: 1      ASSESSMENT AND PLAN: 1. Persistent Atrial Fibrillation (ICD10:  I48.19) The patient's CHA2DS2-VASc score is 5, indicating a 7.2% annual risk of stroke.   We again discussed therapeutic options including AAD (Multaq, dofetilide, amiodarone) and ablation. She still does not want to start AAD or consider ablation at this time. We discussed that it sounds like she is having frequent symptoms and may benefit from rhythm control. She would like to see how she does over the next several weeks before making any decisions.  Continue diltiazem 30 mg PRN q 4 hours for heart racing.  2. Secondary Hypercoagulable State (ICD10:  D68.69) The patient is at significant risk for stroke/thromboembolism based upon her CHA2DS2-VASc Score of 5.  Continue Apixaban (Eliquis).   3. Obesity Body mass index is 32.59 kg/m. Lifestyle modification was discussed and encouraged including regular physical activity and weight reduction.  4. Obstructive sleep apnea Patient reports compliance with CPAP therapy.  5. CAD Non obstructive by [facility] 2012 Followed by Dr Radford Pax.  6. HTN Stable, no changes today.   Follow up in the AF clinic in 2 months, sooner if needed.    [facility] [address] [place], [place] [address] [phone] 05/27/2021 3:28 PM

## 2021-06-05 ENCOUNTER — Other Ambulatory Visit (HOSPITAL_COMMUNITY): Payer: Self-pay | Admitting: Physician Assistant

## 2021-06-11 DIAGNOSIS — J45909 Unspecified asthma, uncomplicated: Secondary | ICD-10-CM | POA: Diagnosis not present

## 2021-06-24 ENCOUNTER — Other Ambulatory Visit: Payer: Self-pay

## 2021-06-24 MED ORDER — APIXABAN 5 MG PO TABS
5.0000 mg | ORAL_TABLET | Freq: Two times a day (BID) | ORAL | 1 refills | Status: DC
Start: 2021-06-24 — End: 2021-07-22

## 2021-06-24 NOTE — Telephone Encounter (Signed)
Patient called back to request a ninety day supply because its cheaper.   Reviewed patients chart.   Rx sent for 90 day supply.   Patient notified and voiced understanding.

## 2021-06-24 NOTE — Telephone Encounter (Signed)
Eliquis '5mg'$  refill request received. Patient is 80 years old, weight-80.8kg, Crea-0.89 on 03/27/21, Diagnosis-Afib, and last seen by Adline Peals, PA on 05/27/2021. Dose is appropriate based on dosing criteria. Will send in refill to requested pharmacy.

## 2021-06-24 NOTE — Telephone Encounter (Signed)
Red a call from the patient requesting  medication refill of Eliquis be sent to [facility] order pharmacy.   I advised patient that I would send the request over to the pharmacy.   She voiced understanding.

## 2021-06-26 DIAGNOSIS — G4733 Obstructive sleep apnea (adult) (pediatric): Secondary | ICD-10-CM | POA: Diagnosis not present

## 2021-07-03 ENCOUNTER — Other Ambulatory Visit: Payer: PPO | Admitting: *Deleted

## 2021-07-03 ENCOUNTER — Other Ambulatory Visit: Payer: Self-pay

## 2021-07-03 DIAGNOSIS — E7800 Pure hypercholesterolemia, unspecified: Secondary | ICD-10-CM | POA: Diagnosis not present

## 2021-07-03 LAB — LIPID PANEL
Chol/HDL Ratio: 3.3 ratio (ref 0.0–4.4)
Cholesterol, Total: 138 mg/dL (ref 100–199)
HDL: 42 mg/dL (ref >39)
LDL Chol Calc (NIH): 65 mg/dL (ref 0–99)
Triglycerides: 184 mg/dL — ABNORMAL HIGH (ref 0–149)
VLDL Cholesterol Cal: 31 mg/dL (ref 5–40)

## 2021-07-03 LAB — ALT: ALT: 12 IU/L (ref 0–32)

## 2021-07-05 ENCOUNTER — Telehealth: Payer: Self-pay

## 2021-07-05 DIAGNOSIS — E7800 Pure hypercholesterolemia, unspecified: Secondary | ICD-10-CM

## 2021-07-05 NOTE — Telephone Encounter (Signed)
-----   Message from Sueanne Margarita, MD sent at 07/04/2021  4:44 PM EDT ----- LDL at goal but TAGS are too high - please find out if patient thinks she can tolerate higher dose of Crestor and if yes then increase to '10mg'$  daily and repeat FLP and ALT in 6 weeks.  If no then refer to lipid clinic

## 2021-07-05 NOTE — Telephone Encounter (Signed)
The patient has been notified of the result and verbalized understanding.  All questions (if any) were answered. Antonieta Iba, RN 07/05/2021 2:06 PM  Patient states that she cannot tolerate higher dose of Crestor. She has been referred to lipid clinic.

## 2021-07-17 DIAGNOSIS — Z1231 Encounter for screening mammogram for malignant neoplasm of breast: Secondary | ICD-10-CM | POA: Diagnosis not present

## 2021-07-22 ENCOUNTER — Telehealth: Payer: Self-pay | Admitting: Pharmacist

## 2021-07-22 ENCOUNTER — Ambulatory Visit (INDEPENDENT_AMBULATORY_CARE_PROVIDER_SITE_OTHER): Payer: PPO | Admitting: Pharmacist

## 2021-07-22 ENCOUNTER — Other Ambulatory Visit: Payer: Self-pay

## 2021-07-22 DIAGNOSIS — G720 Drug-induced myopathy: Secondary | ICD-10-CM | POA: Diagnosis not present

## 2021-07-22 DIAGNOSIS — E7800 Pure hypercholesterolemia, unspecified: Secondary | ICD-10-CM

## 2021-07-22 DIAGNOSIS — T466X5A Adverse effect of antihyperlipidemic and antiarteriosclerotic drugs, initial encounter: Secondary | ICD-10-CM

## 2021-07-22 MED ORDER — APIXABAN 5 MG PO TABS: 5.0000 mg | ORAL_TABLET | Freq: Two times a day (BID) | ORAL | 1 refills | Status: DC

## 2021-07-22 NOTE — Progress Notes (Signed)
Patient ID: Tabitha Murphy                 DOB: May 11, 1941                    MRN: [id number]     HPI: Tabitha Murphy is a 80 y.o. female patient referred to lipid clinic by  Dr. Radford Pax. PMH is significant for mild CAD with 30% blockage of L circumflex, HTN, OSA, HLD, hypothyroidism, atrial fibrillation. She recently had lipids checked which showed elevated TG, and pt was referred to lipid clinic for further management.  Pt presents today in good spirits. Initially prescribed rosuvastatin 5 mg once weekly on 03/2021 by her PCP. Based on 03/27/2021 labs, rosuvastatin was increased to 5 mg daily. When LDL remained elevated on 05/12/2021, rosuvastatin was further increased to 10 mg daily. However, the patient experienced muscle pains and swelling in knees after taking the higher dose for 1-2 weeks, and she decreased her dose back to 5 mg daily. She reports tolerating this better. Still notes some occasional muscle pain but states it's bearable. Of note, she was also prescribed atorvastatin 40 mg in the past but never took it.   Patient asks about her Eliquis stating she is out of medication and the mail order pharmacy couldn't fill it. She also inquired about a 90 day supply as it is cheaper.   Current Medications: rosuvastatin 5 mg daily Previously tried: rosuvastatin '10mg'$  daily - muscle aches Risk Factors: CAD, HTN LDL goal: <70 mg/dL  Diet: Fruit/nutrisystem drink. Cooks on the weekend for her kids. During the week, likes potatoes, rice, broccoli, beans, asparagus. Doesn't eat meat. Drinks 32 oz of fluid a day - water, occasional cup of coffee. No alcohol.  Watchers sugar, prediabetic. Has a glass of sweet tea once every few months. Likes tomato sandwiches  Ex[facility]. Has joined 2 classes - aqua class MWF, yoga in the water TTh  Family History: Sister CAD, Father MI, Brother CAD - dx in his 27s  Social History:  Never smoker  Labs: 07/03/2021: TC 138, TG 184, HDL 42,  LDL 65 (rosuva 5 daily) 05/13/2021: TC 190, TG 260, HDL 47, LDL 99 (rosuva 5 daily) 03/27/2021: TC 184, TG 172, HDL 44, LDL 110 (no LLT) 02/16/2021: TC 171, TG 111, HDL 45, LDL 104 (no LLT)  Past Medical History:  Diagnosis Date   Benign essential HTN    CAD (coronary artery disease), native coronary artery    Cath 2012 with 30% LCx   DCM (dilated cardiomyopathy) ([place])    ? tachy mediated from afib with RVR.  EF 45-50% by echo 01/2021.  Lexiscan myoview with no ischemia   Diverticulosis    Hiatal hernia    HLD (hyperlipidemia)    Persistent atrial fibrillation (HCC)    On DOAC   S/P laparoscopic cholecystectomy 07/28/2011   23 years ago   Thyroid goiter     Current Outpatient Medications on File Prior to Visit  Medication Sig Dispense Refill   albuterol (VENTOLIN HFA) 108 (90 Base) MCG/ACT inhaler Inhale 2 puffs into the lungs every 4 (four) hours as needed for wheezing or shortness of breath.     ALPRAZolam (XANAX) 0.5 MG tablet Take 0.5 mg by mouth daily as needed for anxiety.     apixaban (ELIQUIS) 5 MG TABS tablet Take 1 tablet (5 mg total) by mouth 2 (two) times daily. 180 tablet 1   atenolol (  TENORMIN) 100 MG tablet Take 100 mg by mouth daily.     b complex vitamins tablet Take 1 tablet by mouth daily.     Biotin 1 MG CAPS Take 1 capsule by mouth daily.     calcium carbonate (OS-CAL) 600 MG TABS Take 600 mg by mouth 2 (two) times daily with a meal.     cyclobenzaprine (FLEXERIL) 10 MG tablet Take 5-10 mg by mouth 3 (three) times daily as needed for muscle spasms.     diltiazem (CARDIZEM) 30 MG tablet Take 1 tablet every 4 hours AS NEEDED for afib heart rate >100 30 tablet 1   hydrochlorothiazide (HYDRODIURIL) 12.5 MG tablet Take 12.5 mg by mouth daily.     HYDROcodone-acetaminophen (NORCO/VICODIN) 5-325 MG tablet Take 1 tablet by mouth every 6 (six) hours as needed for moderate pain.     irbesartan (AVAPRO) 300 MG tablet Take 300 mg by mouth every morning.     levothyroxine  (SYNTHROID) 25 MCG tablet Take 25 mcg by mouth every morning.     Multiple Vitamin (MULTIVITAMIN) tablet Take 1 tablet by mouth daily.     omeprazole (PRILOSEC OTC) 20 MG tablet Take 20 mg by mouth daily.     promethazine (PHENERGAN) 25 MG tablet Take 25 mg by mouth every 6 (six) hours as needed for nausea or vomiting.     rosuvastatin (CRESTOR) 5 MG tablet rosuvastatin 5 mg tablet  TAKE 1 TABLET (5 MG TOTAL) BY MOUTH DAILY.     Selenium 100 MCG TABS Take 1 tablet by mouth daily.     valACYclovir (VALTREX) 1000 MG tablet Take 1,000 mg by mouth daily as needed (fever blisters).     vitamin C (ASCORBIC ACID) 500 MG tablet Take 500 mg by mouth daily.     No current facility-administered medications on file prior to visit.    Allergies  Allergen Reactions   Codeine     REACTION: nausea   Duloxetine Diarrhea   Lyrica [Pregabalin] Other (See Comments)    Made pain in legs and feet worse.    Assessment/Plan:  1. Hyperlipidemia - LDL at goal < 70 mg/dL on rosuvastatin '5mg'$  daily, but TG remain mildly elevated at 184 mg/dL above goal of < 150. Patient was counseled on diet modification to decrease intake of sugar and carbohydrates, which will help decrease triglycerides as well as her glucose (prediabetic with A1c of 6.4%). Continue max tolerated statin dose of rosuvastatin '5mg'$  daily. Vascepa cost prohibitive ($100/month), TG not elevated enough to warrant addition of fibrate therapy.   2. Eliquis - Called the patient's mail order pharmacy and they state that the prescription was filled locally therefore they could not fill it. [facility] which confirmed they have a prescription for a 30 day supply ready for pick-up. [facility] and called to confirm co-pay after pt's visit. Copay is $174 due to the patient going into the donut hole in month 3. Gave the patient 2 weeks of free samples. Called patient to explain that she should pick up a 90 day supply  and we will give her samples for the last month to get her through the end of the year and instruct her to get 90 day supplies of the medication next year to save money.   Megan E. Supple, PharmD, BCACP, [place] [id number], [place], [place] [address] Phone: [phone]; Fax: [phone] 07/22/2021 4:19 PM

## 2021-07-22 NOTE — Patient Instructions (Addendum)
It was nice to meet you today  Your LDL cholesterol improved from 104 to 65 and is now at your goal < 70  Continue taking rosuvastatin (Crestor) '5mg'$  once daily  Your triglycerides are a bit elevated at 184 and your goal is < 150  Keep an eye on the carbohydrates and sugars in your diet. This will also help to keep your blood sugar more controlled (your A1c is currently in the pre-diabetic range at 6.4%)

## 2021-07-22 NOTE — Telephone Encounter (Addendum)
Left message for pt - 1 month of Eliquis is [facility] but 3 month supply is $174 (will go into donut hole in 3rd month of fill). Will see if pt is able to fill 3 month supply, she would then only need 1 month of samples in December to carry her through until plan resets in January. She should fill as a 3 month supply then for cost savings ($90/3 month supply vs $45/1 month that she's been paying).

## 2021-07-23 NOTE — Telephone Encounter (Signed)
Called pt again, she prefers to continue picking up as a 1 month supply of Eliquis.

## 2021-07-24 ENCOUNTER — Other Ambulatory Visit: Payer: Self-pay

## 2021-07-24 ENCOUNTER — Encounter (HOSPITAL_COMMUNITY): Payer: Self-pay | Admitting: Physician Assistant

## 2021-07-24 ENCOUNTER — Ambulatory Visit (HOSPITAL_COMMUNITY)
Admission: RE | Admit: 2021-07-24 | Discharge: 2021-07-24 | Disposition: A | Discharge status: Home / Self Care | Payer: PPO | Source: Ambulatory Visit | Attending: Physician Assistant | Admitting: Physician Assistant

## 2021-07-24 VITALS — BP 128/74 | HR 58 | Ht 62.0 in | Wt 173.0 lb

## 2021-07-24 DIAGNOSIS — Z6831 Body mass index (BMI) 31.0-31.9, adult: Secondary | ICD-10-CM | POA: Diagnosis not present

## 2021-07-24 DIAGNOSIS — I4819 Other persistent atrial fibrillation: Secondary | ICD-10-CM | POA: Insufficient documentation

## 2021-07-24 DIAGNOSIS — Z9989 Dependence on other enabling machines and devices: Secondary | ICD-10-CM | POA: Diagnosis not present

## 2021-07-24 DIAGNOSIS — I2510 Atherosclerotic heart disease of native coronary artery without angina pectoris: Secondary | ICD-10-CM | POA: Diagnosis not present

## 2021-07-24 DIAGNOSIS — Z7901 Long term (current) use of anticoagulants: Secondary | ICD-10-CM | POA: Diagnosis not present

## 2021-07-24 DIAGNOSIS — E669 Obesity, unspecified: Secondary | ICD-10-CM | POA: Insufficient documentation

## 2021-07-24 DIAGNOSIS — I10 Essential (primary) hypertension: Secondary | ICD-10-CM | POA: Diagnosis not present

## 2021-07-24 DIAGNOSIS — Z79890 Hormone replacement therapy (postmenopausal): Secondary | ICD-10-CM | POA: Insufficient documentation

## 2021-07-24 DIAGNOSIS — E785 Hyperlipidemia, unspecified: Secondary | ICD-10-CM | POA: Insufficient documentation

## 2021-07-24 DIAGNOSIS — D6869 Other thrombophilia: Secondary | ICD-10-CM | POA: Diagnosis not present

## 2021-07-24 DIAGNOSIS — Z8249 Family history of ischemic heart disease and other diseases of the circulatory system: Secondary | ICD-10-CM | POA: Diagnosis not present

## 2021-07-24 DIAGNOSIS — G4733 Obstructive sleep apnea (adult) (pediatric): Secondary | ICD-10-CM | POA: Insufficient documentation

## 2021-07-24 DIAGNOSIS — Z79899 Other long term (current) drug therapy: Secondary | ICD-10-CM | POA: Insufficient documentation

## 2021-07-24 DIAGNOSIS — E039 Hypothyroidism, unspecified: Secondary | ICD-10-CM | POA: Insufficient documentation

## 2021-07-24 NOTE — Progress Notes (Signed)
Primary Care Physician: [name], MD Primary Cardiologist: Dr Radford Pax Primary Electrophysiologist: none Referring Physician: Dr Marzetta Merino is a 80 y.o. female with a history of CAD, HTN, OSA, HLD, hypothyroidism, atrial fibrillation who presents for follow up in the [facility]. The patient was initially diagnosed with atrial fibrillation 01/2021 after presenting to the ED with symptoms of intermittent CP. She was in rate controlled afib. Echo showed EF 45-50% with hypokinetic basal segments. Lexiscan did not show ischemia. She was discharged in rate controlled afib with plan for outpatient DCCV but converted spontaneously on her own. Patient is on Eliquis for a CHADS2VASC score of 5. She reports that she went back into afib since 05/04/21 with symptoms of palpitations and heart racing. There were no specific triggers that she could identify. She is compliant with CPAP therapy.   On follow up today, patient reports she has done well since her last visit. She now has an Visual merchandiser which has shown SR since her last visit. She denies any bleeding issues on anticoagulation.   Today, she denies symptoms of palpitations, CP, shortness of breath, orthopnea, PND, lower extremity edema, dizziness, presyncope, syncope, bleeding, or neurologic sequela. The patient is tolerating medications without difficulties and is otherwise without complaint today.    Atrial Fibrillation Risk Factors:  she does have symptoms or diagnosis of sleep apnea. she is compliant with CPAP therapy. she does have a history of rheumatic fever. she does not have a history of alcohol use. The patient does not have a history of early familial atrial fibrillation or other arrhythmias.  she has a BMI of Body mass index is 31.64 kg/m.Marland Kitchen Filed Weights   07/24/21 1341  Weight: 78.5 kg      Family History  Problem Relation Age of Onset   Cervical cancer Mother    CAD Sister     Heart attack Father        Died in early 18s with MI   Heart disease Brother    Colon cancer Neg Hx      Atrial Fibrillation Management history:  Previous antiarrhythmic drugs: none Previous cardioversions: none Previous ablations: none CHADS2VASC score: 5 Anticoagulation history: Eliquis   Past Medical History:  Diagnosis Date   Benign essential HTN    CAD (coronary artery disease), native coronary artery    Cath 2012 with 30% LCx   DCM (dilated cardiomyopathy) ([place])    ? tachy mediated from afib with RVR.  EF 45-50% by echo 01/2021.  Lexiscan myoview with no ischemia   Diverticulosis    Hiatal hernia    HLD (hyperlipidemia)    Persistent atrial fibrillation (HCC)    On DOAC   S/P laparoscopic cholecystectomy 07/28/2011   23 years ago   Thyroid goiter    Past Surgical History:  Procedure Laterality Date   child birth     x3   CHOLECYSTECTOMY      Current Outpatient Medications  Medication Sig Dispense Refill   albuterol (VENTOLIN HFA) 108 (90 Base) MCG/ACT inhaler Inhale 2 puffs into the lungs every 4 (four) hours as needed for wheezing or shortness of breath.     ALPRAZolam (XANAX) 0.5 MG tablet Take 0.5 mg by mouth daily as needed for anxiety.     apixaban (ELIQUIS) 5 MG TABS tablet Take 1 tablet (5 mg total) by mouth 2 (two) times daily. 180 tablet 1   Apoaequorin (PREVAGEN) 10 MG CAPS Take 1 capsule by mouth  daily.     atenolol (TENORMIN) 100 MG tablet Take 100 mg by mouth daily.     b complex vitamins tablet Take 1 tablet by mouth daily.     calcium carbonate (OS-CAL) 600 MG TABS Take 600 mg by mouth 2 (two) times daily with a meal.     cyclobenzaprine (FLEXERIL) 10 MG tablet Take 5 mg by mouth 3 (three) times daily as needed for muscle spasms.     diltiazem (CARDIZEM) 30 MG tablet Take 1 tablet every 4 hours AS NEEDED for afib heart rate >100 30 tablet 1   hydrochlorothiazide (HYDRODIURIL) 12.5 MG tablet Take 12.5 mg by mouth daily.      HYDROcodone-acetaminophen (NORCO/VICODIN) 5-325 MG tablet Take 1 tablet by mouth every 6 (six) hours as needed for moderate pain.     irbesartan (AVAPRO) 300 MG tablet Take 300 mg by mouth every morning.     levothyroxine (SYNTHROID) 25 MCG tablet Take 25 mcg by mouth every morning.     Multiple Vitamin (MULTIVITAMIN) tablet Take 1 tablet by mouth daily.     omeprazole (PRILOSEC OTC) 20 MG tablet Take 20 mg by mouth daily.     promethazine (PHENERGAN) 25 MG tablet Take 25 mg by mouth every 6 (six) hours as needed for nausea or vomiting.     rosuvastatin (CRESTOR) 5 MG tablet rosuvastatin 5 mg tablet  TAKE 1 TABLET (5 MG TOTAL) BY MOUTH DAILY.     Selenium 100 MCG TABS Take 1 tablet by mouth daily.     valACYclovir (VALTREX) 1000 MG tablet Take 1,000 mg by mouth daily as needed (fever blisters).     vitamin C (ASCORBIC ACID) 500 MG tablet Take 500 mg by mouth daily.     No current facility-administered medications for this encounter.    Allergies  Allergen Reactions   Codeine     REACTION: nausea   Duloxetine Diarrhea   Lyrica [Pregabalin] Other (See Comments)    Made pain in legs and feet worse.    Social History   Socioeconomic History   Marital status: Widowed    Spouse name: Not on file   Number of children: Not on file   Years of education: Not on file   Highest education level: Not on file  Occupational History   Occupation: retired  Tobacco Use   Smoking status: Never   Smokeless tobacco: Never  Substance and Sexual Activity   Alcohol use: No   Drug use: No    Comment: CBD gummies   Sexual activity: Not on file  Other Topics Concern   Not on file  Social History Narrative   Not on file   Social Determinants of Health   Financial Resource Strain: Not on file  Food Insecurity: Not on file  Transportation Needs: Not on file  Physical Activity: Not on file  Stress: Not on file  Social Connections: Not on file  Intimate Partner Violence: Not on file      ROS- All systems are reviewed and negative except as per the HPI above.  Physical Exam: Vitals:   07/24/21 1341  BP: 128/74  Pulse: (!) 58  Weight: 78.5 kg  Height: '5\' 2"'$  (1.575 m)    GEN- The patient is a well appearing obese elderly female, alert and oriented x 3 today.   HEENT-head normocephalic, atraumatic, sclera clear, conjunctiva pink, hearing intact, trachea midline. Lungs- Clear to ausculation bilaterally, normal work of breathing Heart- Regular rate and rhythm, no murmurs, rubs or gallops  GI- soft, NT, ND, + BS Extremities- no clubbing, cyanosis, or edema MS- no significant deformity or atrophy Skin- no rash or lesion Psych- euthymic mood, full affect Neuro- strength and sensation are intact   Wt Readings from Last 3 Encounters:  07/24/21 78.5 kg  05/27/21 80.8 kg  05/13/21 80 kg    EKG today demonstrates  SB, PAC Vent. rate 58 BPM PR interval 128 ms QRS duration 84 ms QT/QTcB 430/422 ms  Echo 02/15/21 demonstrated  1. Left ventricular ejection fraction, by estimation, is 45 to 50%. The  left ventricle has mildly decreased function. Left ventricular endocardial  border not optimally defined to evaluate regional wall motion. Basal  segments are hypokinetic with best  preseved function in mid and apex. Left ventricular diastolic parameters  are indeterminate.   2. Right ventricular systolic function is mildly reduced. The right  ventricular size is normal. Tricuspid regurgitation signal is inadequate  for assessing PA pressure.   3. Left atrial size was mildly dilated.   4. Right atrial size was mildly dilated.   5. The mitral valve is normal in structure. Trivial mitral valve  regurgitation. No evidence of mitral stenosis.   6. The aortic valve is grossly normal. There is mild calcification of the  aortic valve. Aortic valve regurgitation is not visualized. No aortic  stenosis is present.   7. The inferior vena cava is normal in size with greater  than 50%  respiratory variability, suggesting right atrial pressure of 3 mmHg.   Epic records are reviewed at length today  CHA2DS2-VASc Score = 5  The patient's score is based upon: CHF History: No HTN History: Yes Diabetes History: No Stroke History: No Vascular Disease History: Yes Age Score: 2 Gender Score: 1      ASSESSMENT AND PLAN: 1. Persistent Atrial Fibrillation (ICD10:  I48.19) The patient's CHA2DS2-VASc score is 5, indicating a 7.2% annual risk of stroke.   Patient appears to be maintaining SR.  Continue Apple watch for home monitoring.  Could consider Multaq, dofetilide, amiodarone or ablation if her symptoms become more persistent.  Continue diltiazem 30 mg PRN q 4 hours for heart racing.  2. Secondary Hypercoagulable State (ICD10:  D68.69) The patient is at significant risk for stroke/thromboembolism based upon her CHA2DS2-VASc Score of 5.  Continue Apixaban (Eliquis).   3. Obesity Body mass index is 31.64 kg/m. Lifestyle modification was discussed and encouraged including regular physical activity and weight reduction.  4. Obstructive sleep apnea Patient reports compliance with CPAP therapy.  5. CAD Non obstructive by [facility] 2012 No anginal symptoms.  6. HTN Stable, no changes today.   Follow up in the AF clinic in 6 months.    [facility] [address] [place], [place] [address] [phone] 07/24/2021 2:05 PM

## 2021-07-31 DIAGNOSIS — H26492 Other secondary cataract, left eye: Secondary | ICD-10-CM | POA: Diagnosis not present

## 2021-07-31 DIAGNOSIS — Z961 Presence of intraocular lens: Secondary | ICD-10-CM | POA: Diagnosis not present

## 2021-07-31 DIAGNOSIS — Z9889 Other specified postprocedural states: Secondary | ICD-10-CM | POA: Diagnosis not present

## 2021-07-31 DIAGNOSIS — I4891 Unspecified atrial fibrillation: Secondary | ICD-10-CM | POA: Diagnosis not present

## 2021-08-15 ENCOUNTER — Other Ambulatory Visit (HOSPITAL_COMMUNITY): Payer: Self-pay | Admitting: Physician Assistant

## 2021-08-15 DIAGNOSIS — M5416 Radiculopathy, lumbar region: Secondary | ICD-10-CM | POA: Diagnosis not present

## 2021-08-21 DIAGNOSIS — R6881 Early satiety: Secondary | ICD-10-CM | POA: Diagnosis not present

## 2021-08-21 DIAGNOSIS — Z1211 Encounter for screening for malignant neoplasm of colon: Secondary | ICD-10-CM | POA: Diagnosis not present

## 2021-08-21 DIAGNOSIS — R634 Abnormal weight loss: Secondary | ICD-10-CM | POA: Diagnosis not present

## 2021-08-21 DIAGNOSIS — R630 Anorexia: Secondary | ICD-10-CM | POA: Diagnosis not present

## 2021-08-21 DIAGNOSIS — Z7901 Long term (current) use of anticoagulants: Secondary | ICD-10-CM | POA: Diagnosis not present

## 2021-08-22 ENCOUNTER — Telehealth: Payer: Self-pay | Admitting: *Deleted

## 2021-08-22 NOTE — Telephone Encounter (Signed)
   [facility] Pre-operative Risk Assessment    Patient Name: Tabitha Murphy  DOB: 1941-06-14 MRN: [id number]  HEARTCARE STAFF:  - IMPORTANT!!!!!! Under Visit Info/Reason for Call, type in Other and utilize the format Clearance MM/DD/YY or Clearance TBD. Do not use dashes or single digits. - Please review there is not already an duplicate clearance open for this procedure. - If request is for dental extraction, please clarify the # of teeth to be extracted. - If the patient is currently at the dentist's office, call Pre-Op Callback Staff (MA/nurse) to input urgent request.  - If the patient is not currently in the dentist office, please route to the Pre-Op pool.  Request for surgical clearance:  What type of surgery is being performed? SCREENING COLONOSCOPY/ENDOSCOPY  When is this surgery scheduled? 12/04/21  What type of clearance is required (medical clearance vs. Pharmacy clearance to hold med vs. Both)? BOTH  Are there any medications that need to be held prior to surgery and how long? ELIQUIS x 1 DAY PRIOR TO PROCEDURE  Practice name and name of physician performing surgery? [facility]; DR. Therisa Doyne  What is the office phone number? [phone]   7.   What is the office fax number? [phone]  8.   Anesthesia type (None, local, MAC, general) ? PROPOFOL    Julaine Hua 08/22/2021, 1:18 PM  _________________________________________________________________   (provider comments below)

## 2021-08-23 NOTE — Telephone Encounter (Signed)
Patient with diagnosis of afib on Eliquis for anticoagulation.    Procedure: screening colonoscopy/endoscopy Date of procedure: 12/04/21  CHA2DS2-VASc Score = 5  This indicates a 7.2% annual risk of stroke. The patient's score is based upon: CHF History: 0 HTN History: 1 Diabetes History: 0 Stroke History: 0 Vascular Disease History: 1 Age Score: 2 Gender Score: 1  CrCl 49mL/min using adjusted body weight due to obesity Platelet count 301K  Per office protocol, patient can hold Eliquis for 1 day prior to procedure as requested assuming no major clinical changes (ie stroke or planned DCCV/ablation) in the next few months before procedure date.

## 2021-08-26 NOTE — Telephone Encounter (Addendum)
   Name: Tabitha Murphy  DOB: 1941-06-01  MRN: [id number]   Primary Cardiologist: Fransico Him, MD  Chart reviewed as part of pre-operative protocol coverage. Patient was contacted 08/26/2021 in reference to pre-operative risk assessment for pending surgery as outlined below.  Tabitha Murphy was last seen by Dr. Radford Pax 03/2021. She has also had interim follow-up with pharmD team and afib clinic. History reviewed.  I reached out to patient for update on how she is doing. The patient affirms she has been doing well without any new cardiac symptoms from baseline. She has rare short lived breakthrough palpitations but these do not last long and are consistent with her prior baseline. No CP, SOB, syncope reported. No recent need for DCCV. Therefore, based on ACC/AHA guidelines, the patient would be at acceptable risk for the planned procedure without further cardiovascular testing. The patient was advised that if she develops new symptoms prior to surgery to contact our office to arrange for a follow-up visit, and she verbalized understanding. Regarding anticoagulation, per pharmD, "Per office protocol, patient can hold Eliquis for 1 day prior to procedure as requested assuming no major clinical changes (ie stroke or planned DCCV/ablation) in the next few months before procedure date." Any of these interim history issues would also require need to revisit medical clearance as well.  I will route this recommendation to the requesting party via Epic fax function and remove from pre-op pool. Please call with questions.  Charlie Pitter, PA-C 08/26/2021, 11:21 AM

## 2021-09-02 DIAGNOSIS — R609 Edema, unspecified: Secondary | ICD-10-CM | POA: Diagnosis not present

## 2021-09-02 DIAGNOSIS — R7303 Prediabetes: Secondary | ICD-10-CM | POA: Diagnosis not present

## 2021-09-02 DIAGNOSIS — E7800 Pure hypercholesterolemia, unspecified: Secondary | ICD-10-CM | POA: Diagnosis not present

## 2021-09-02 DIAGNOSIS — I10 Essential (primary) hypertension: Secondary | ICD-10-CM | POA: Diagnosis not present

## 2021-09-02 DIAGNOSIS — B009 Herpesviral infection, unspecified: Secondary | ICD-10-CM | POA: Diagnosis not present

## 2021-09-02 DIAGNOSIS — F419 Anxiety disorder, unspecified: Secondary | ICD-10-CM | POA: Diagnosis not present

## 2021-09-02 DIAGNOSIS — Z23 Encounter for immunization: Secondary | ICD-10-CM | POA: Diagnosis not present

## 2021-09-02 DIAGNOSIS — E039 Hypothyroidism, unspecified: Secondary | ICD-10-CM | POA: Diagnosis not present

## 2021-09-02 DIAGNOSIS — G629 Polyneuropathy, unspecified: Secondary | ICD-10-CM | POA: Diagnosis not present

## 2021-09-02 DIAGNOSIS — I4891 Unspecified atrial fibrillation: Secondary | ICD-10-CM | POA: Diagnosis not present

## 2021-09-02 DIAGNOSIS — L299 Pruritus, unspecified: Secondary | ICD-10-CM | POA: Diagnosis not present

## 2021-09-02 DIAGNOSIS — J45909 Unspecified asthma, uncomplicated: Secondary | ICD-10-CM | POA: Diagnosis not present

## 2021-09-09 DIAGNOSIS — E039 Hypothyroidism, unspecified: Secondary | ICD-10-CM | POA: Diagnosis not present

## 2021-09-09 DIAGNOSIS — F329 Major depressive disorder, single episode, unspecified: Secondary | ICD-10-CM | POA: Diagnosis not present

## 2021-09-09 DIAGNOSIS — I4891 Unspecified atrial fibrillation: Secondary | ICD-10-CM | POA: Diagnosis not present

## 2021-09-09 DIAGNOSIS — I10 Essential (primary) hypertension: Secondary | ICD-10-CM | POA: Diagnosis not present

## 2021-09-09 DIAGNOSIS — G4700 Insomnia, unspecified: Secondary | ICD-10-CM | POA: Diagnosis not present

## 2021-09-09 DIAGNOSIS — E7800 Pure hypercholesterolemia, unspecified: Secondary | ICD-10-CM | POA: Diagnosis not present

## 2021-09-09 DIAGNOSIS — K219 Gastro-esophageal reflux disease without esophagitis: Secondary | ICD-10-CM | POA: Diagnosis not present

## 2021-09-09 DIAGNOSIS — J45909 Unspecified asthma, uncomplicated: Secondary | ICD-10-CM | POA: Diagnosis not present

## 2021-09-23 DIAGNOSIS — H903 Sensorineural hearing loss, bilateral: Secondary | ICD-10-CM | POA: Diagnosis not present

## 2021-09-27 DIAGNOSIS — M79605 Pain in left leg: Secondary | ICD-10-CM | POA: Diagnosis not present

## 2021-09-30 ENCOUNTER — Telehealth: Payer: Self-pay | Admitting: Cardiology

## 2021-09-30 NOTE — Telephone Encounter (Signed)
Steroid injections can definitely trigger episodes of PAF.  Please prescribe Cardizem 30mg  to take PRN for breakthrough PAF.  If she gets afib lasting more than 24 hours she needs to call us  ----- Message -----  From: Loren Racer, RN  Sent: 09/30/2021  10:44 AM EDT  To: Sueanne Margarita, MD, Af Clinical

## 2021-09-30 NOTE — Telephone Encounter (Signed)
Reached out to patient to provide information about Cortisone shot. Patient did not answer phone no voicemail on home number. Tabitha Murphy stated that the Cortisone can trigger Afib and that she may need to discuss other treatment options for arthritis in her knee.

## 2021-09-30 NOTE — Telephone Encounter (Signed)
Patient c/o Palpitations:  High priority if patient c/o lightheadedness, shortness of breath, or chest pain  How long have you had palpitations/irregular HR/ Afib? Are you having the symptoms now? No.   Are you currently experiencing lightheadedness, SOB or CP? no  Do you have a history of afib (atrial fibrillation) or irregular heart rhythm? yes  Have you checked your BP or HR? (document readings if available): Patient wears a watch to track her HR   Are you experiencing any other symptoms? No   Patient was in Afib from about 5:00 pm Friday until Sunday morning . The patient said she got a Cortisone shot on Friday because of the arthritis in her knee. She is not sure if the cortisone shot is what triggered her afib or not. She wanted to know because she is scheduled to get another shot next Friday 10/11/21 and is not sure if she should get the second shot.  Please advise

## 2021-10-01 NOTE — Telephone Encounter (Signed)
Spoke with the patient, she is already taking cardizem 30 mg PRN. She states that her primary care provider also prescribed her Cardizem 120 mg daily. She states that it has been helping. I have advised the patient to discuss other treatment options with for her arthritis. Patient verbalized understanding.

## 2021-10-03 ENCOUNTER — Other Ambulatory Visit: Payer: Self-pay | Admitting: *Deleted

## 2021-10-09 NOTE — Progress Notes (Signed)
Cardiology Office Note:    Date:  10/11/2021   ID:  Tabitha Murphy, DOB 11/06/1941, MRN [id number]  PCP:  [name], MD   [facility] Providers Cardiologist:  Fransico Him, MD {  Referring MD: [name], MD   Follow-up for hypertension and coronary artery disease  History of Present Illness:    Tabitha Murphy is a 80 y.o. female with a hx of persistent atrial fibrillation, mild AAS CAD with 30% circumflex stenosis observed on 2012 cardiac catheterization with normal LVEF, HOH, HTN, and prediabetes.  She presented to the emergency department 3/22 with complaints of chest discomfort.  She noted several episodes of chest discomfort that would last 20 minutes - 30 minutes.  Her symptoms were associated with diaphoresis and then would spontaneously dissipate.  Prior to admission she had been at the [facility] and was tolerated swimming without difficulty.  She left the pool and developed chest pressure she also noted profuse sweating at that time.  She denied shortness of breath, radiation, and nausea.  She had a friend at the [facility] to go to lunch.  She told her friend that she did not feel well and the staff at the [facility] contacted EMS.  On presentation to the emergency department she was noted to have rate controlled atrial fibrillation with a heart rate in the 60s-80s.  She was noted to have a 14 beat run of WCT in the setting of hypokalemia.  Her potassium at that time was 2.9.  Her TSH was low at 0.16.  She was ruled out for MI and troponin levels were normal.  Her echocardiogram showed mild LV dysfunction with an EF of 45-50% and hypokinesis of the basal segment.  A nuclear stress test showed no ischemia.  She was started on apixaban 5 mg for CHA2DS2-VASc score of 5.  Plans made for follow-up outpatient and DCCV after 3 weeks of anticoagulation if she remained in atrial fibrillation.  She was continued on atenolol 100 mg daily.  She was also started on statin therapy for a LDL of 106.  She  presented for follow-up 03/27/2021.  During that time she continued to do well.  She noted 1 episode of chest discomfort that she described as a squeezing type sensation in her chest that lasted for about 5 minutes and dissipated on its own.  She denied shortness of breath DOE, PND orthopnea, lower extremity edema, dizziness, and syncope.  She did note heart skipping all the time which she said have been present for years.  She was tolerating her medications well and denied side effects.  She followed up with Malka So, PA on 07/24/2021.  During that time she reported that she continued to do well.  Her apple watch showed her that she continued to be in sinus rhythm.  She denied bleeding issues and reported compliance with her anticoagulation.  She denied palpitations, chest pain, shortness of breath, and neurological sequela.  She continued to tolerate her medications well.  She reported compliance with her CPAP and denied family history of early familial A. fib or arrhythmias.  She presents the clinic today for follow-up evaluation states She has not had any significant chest pain since February of 2022. She occasionally has short episodes that last seconds but spontaneously resolve. She has not needed any nitroglycerin for these episodes. She has been exercising in a pool 5 days a week and has maintained a heart healthy diet. She occasionally notices some lower extremity edema but today she  appears euvolemic. She continues to have issues with her back and knee which limits her out of pool activities.   She denies chest pain, shortness of breath, lower extremity edema, fatigue, palpitations, diaphoresis, weakness, presyncope, syncope, orthopnea, and PND.   Past Medical History:  Diagnosis Date   Benign essential HTN    CAD (coronary artery disease), native coronary artery    Cath 2012 with 30% LCx   DCM (dilated cardiomyopathy) ([place])    ? tachy mediated from afib with RVR.  EF 45-50% by echo  01/2021.  Lexiscan myoview with no ischemia   Diverticulosis    Hiatal hernia    HLD (hyperlipidemia)    Persistent atrial fibrillation (HCC)    On DOAC   S/P laparoscopic cholecystectomy 07/28/2011   23 years ago   Thyroid goiter     Past Surgical History:  Procedure Laterality Date   child birth     x3   CHOLECYSTECTOMY      Current Medications: Current Meds  Medication Sig   albuterol (VENTOLIN HFA) 108 (90 Base) MCG/ACT inhaler Inhale 2 puffs into the lungs every 4 (four) hours as needed for wheezing or shortness of breath.   ALPRAZolam (XANAX) 0.5 MG tablet Take 0.5 mg by mouth daily as needed for anxiety.   apixaban (ELIQUIS) 5 MG TABS tablet Take 1 tablet (5 mg total) by mouth 2 (two) times daily.   Apoaequorin (PREVAGEN) 10 MG CAPS Take 1 capsule by mouth daily.   atenolol (TENORMIN) 100 MG tablet Take 100 mg by mouth daily.   b complex vitamins tablet Take 1 tablet by mouth daily.   calcium carbonate (OS-CAL) 600 MG TABS Take 600 mg by mouth 2 (two) times daily with a meal.   cyclobenzaprine (FLEXERIL) 10 MG tablet Take 5 mg by mouth 3 (three) times daily as needed for muscle spasms.   DILT-XR 120 MG 24 hr capsule Take 120 mg by mouth every morning.   furosemide (LASIX) 20 MG tablet Take 20 mg by mouth as needed.   hydrochlorothiazide (HYDRODIURIL) 12.5 MG tablet Take 12.5 mg by mouth daily.   HYDROcodone-acetaminophen (NORCO/VICODIN) 5-325 MG tablet Take 1 tablet by mouth every 6 (six) hours as needed for moderate pain.   irbesartan (AVAPRO) 300 MG tablet Take 300 mg by mouth every morning.   levothyroxine (SYNTHROID) 25 MCG tablet Take 25 mcg by mouth every morning.   Multiple Vitamin (MULTIVITAMIN) tablet Take 1 tablet by mouth daily.   omeprazole (PRILOSEC OTC) 20 MG tablet Take 20 mg by mouth daily.   promethazine (PHENERGAN) 25 MG tablet Take 25 mg by mouth every 6 (six) hours as needed for nausea or vomiting.   rosuvastatin (CRESTOR) 5 MG tablet rosuvastatin 5 mg  tablet  TAKE 1 TABLET (5 MG TOTAL) BY MOUTH DAILY.   Selenium 100 MCG TABS Take 1 tablet by mouth daily.   valACYclovir (VALTREX) 1000 MG tablet Take 1,000 mg by mouth daily as needed (fever blisters).   vitamin C (ASCORBIC ACID) 500 MG tablet Take 500 mg by mouth daily.     Allergies:   Codeine, Duloxetine, and Lyrica [pregabalin]   Social History   Socioeconomic History   Marital status: Widowed    Spouse name: Not on file   Number of children: Not on file   Years of education: Not on file   Highest education level: Not on file  Occupational History   Occupation: retired  Tobacco Use   Smoking status: Never   Smokeless tobacco:  Never  Substance and Sexual Activity   Alcohol use: No   Drug use: No    Comment: CBD gummies   Sexual activity: Not on file  Other Topics Concern   Not on file  Social History Narrative   Not on file   Social Determinants of Health   Financial Resource Strain: Not on file  Food Insecurity: Not on file  Transportation Needs: Not on file  Physical Activity: Not on file  Stress: Not on file  Social Connections: Not on file     Family History: The patient's family history includes CAD in her sister; Cervical cancer in her mother; Heart attack in her father; Heart disease in her brother. There is no history of Colon cancer.  ROS:   Please see the history of present illness.    All other systems reviewed and are negative.   Risk Assessment/Calculations:    CHA2DS2-VASc Score = 5   This indicates a 7.2% annual risk of stroke. The patient's score is based upon: CHF History: 0 HTN History: 1 Diabetes History: 0 Stroke History: 0 Vascular Disease History: 1 Age Score: 2 Gender Score: 1          Physical Exam:    VS:  BP 132/74 (BP Location: Left Arm, Patient Position: Sitting, Cuff Size: Large)   Pulse 63   Ht 5\' 2"  (1.575 m)   Wt 171 lb 6.4 oz (77.7 kg)   BMI 31.35 kg/m     Wt Readings from Last 3 Encounters:  10/11/21 171  lb 6.4 oz (77.7 kg)  07/24/21 173 lb (78.5 kg)  05/27/21 178 lb 3.2 oz (80.8 kg)     GEN: Well nourished, well developed in no acute distress HEENT: Normal LYMPHATICS: No lymphadenopathy CARDIAC: RRR, no murmurs, rubs, gallops RESPIRATORY:  Clear to auscultation without rales, wheezing or rhonchi  ABDOMEN: Soft, non-tender, non-distended MUSCULOSKELETAL:  No edema; No deformity  SKIN: Warm and dry NEUROLOGIC:  Alert and oriented x 3 PSYCHIATRIC:  Normal affect    EKGs/Labs/Other Studies Reviewed:    The following studies were reviewed today:  Echocardiogram 02/15/2021  IMPRESSIONS     1. Left ventricular ejection fraction, by estimation, is 45 to 50%. The  left ventricle has mildly decreased function. Left ventricular endocardial  border not optimally defined to evaluate regional wall motion. Basal  segments are hypokinetic with best  preseved function in mid and apex. Left ventricular diastolic parameters  are indeterminate.   2. Right ventricular systolic function is mildly reduced. The right  ventricular size is normal. Tricuspid regurgitation signal is inadequate  for assessing PA pressure.   3. Left atrial size was mildly dilated.   4. Right atrial size was mildly dilated.   5. The mitral valve is normal in structure. Trivial mitral valve  regurgitation. No evidence of mitral stenosis.   6. The aortic valve is grossly normal. There is mild calcification of the  aortic valve. Aortic valve regurgitation is not visualized. No aortic  stenosis is present.   7. The inferior vena cava is normal in size with greater than 50%  respiratory variability, suggesting right atrial pressure of 3 mmHg.  Nuclear stress test 02/16/2009  IMPRESSION: 1. No reversible ischemia. Small region of scarring in the anterior apex. Reduced inferior wall activity on stress and rest images favors diaphragmatic attenuation.   2. Normal left ventricular wall motion.   3. Left ventricular  ejection fraction 61%   4. Non invasive risk stratification*: Low   *2012  Appropriate Use Criteria for Coronary Revascularization Focused Update: J Am Coll Cardiol. 4917;91(5):056-979. http://content.airportbarriers.com.aspx?articleid=[email or web address]     Electronically Signed   By: Van Clines M.D.   On: 02/16/2021 17:27    EKG:  EKG is ordered today.  The ekg ordered today demonstrates   Recent Labs: 02/14/2021: TSH 0.160 02/16/2021: Magnesium 2.0 02/17/2021: Hemoglobin 11.6; Platelets 301 03/27/2021: BUN 8; Creatinine, Ser 0.89; Potassium 4.3; Sodium 135 07/03/2021: ALT 12  Recent Lipid Panel    Component Value Date/Time   CHOL 138 07/03/2021 0902   TRIG 184 (H) 07/03/2021 0902   HDL 42 07/03/2021 0902   CHOLHDL 3.3 07/03/2021 0902   CHOLHDL 3.8 02/16/2021 0404   VLDL 22 02/16/2021 0404   LDLCALC 65 07/03/2021 0902    ASSESSMENT & PLAN    Coronary artery disease-no recent episodes arm neck back or chest discomfort.  She did have an episode back in February but otherwise only had pain for a few seconds at a time with spontaneous resolution. Underwent stress testing 02/16/2021 which showed normal EF no ischemia and low risk. Continue GDMT ASA, statin, Eliquis, and BB Heart healthy low-sodium diet-salty 6 given Increase physical activity as tolerated. She continues to do pool-based activity 5 days a week  Persistent atrial fibrillation-heart rate today 63.  She goes in and out of atrial fibrillation but has been rate controlled without palpitations. Reports compliance with apixaban and denies bleeding issues. Continue diltiazem, apixaban  Dilated cardiomyopathy-no increased DOE or activity intolerance.  Euvolemic on exam  today.  Echocardiogram 02/15/2021 showed EF 45-50% basal segments with HK, and left and right mild atrial dilation. Continue atenolol, hydrochlorothiazide, irbesartan   Essential hypertension-BP today 132/74.  Well-controlled at home.  Continue atenolol,  HCTZ, irbesartan Heart healthy low-sodium diet-salty 6 given Increase physical activity as tolerated  Hyperlipidemia-02/16/2021: VLDL 22 07/03/2021: Cholesterol, Total 138; HDL 42; LDL Chol Calc (NIH) 65; Triglycerides 184. Lab reviewed on kpn and remain stable. Will repeat Lipid panel in 1 year.  Continue rosuvastatin Heart healthy low-sodium high-fiber diet.   Increase physical activity as tolerated  Disposition: Follow-up with Dr. Radford Pax or APP in 6 months.     Medication Adjustments/Labs and Tests Ordered: Current medicines are reviewed at length with the patient today.  Concerns regarding medicines are outlined above.  No orders of the defined types were placed in this encounter.  No orders of the defined types were placed in this encounter.   Patient Instructions  Medication Instructions:  Your Physician recommend you continue on your current medication as directed.    *If you need a refill on your cardiac medications before your next appointment, please call your pharmacy*   Lab Work: Your physician recommends that you return for lab work in:  Lipid panel in 1 year   If you have labs (blood work) drawn today and your tests are completely normal, you will receive your results only by: [place] (if you have [place]) OR A paper copy in the mail If you have any lab test that is abnormal or we need to change your treatment, we will call you to review the results.   Testing/Procedures: None ordered today    Follow-Up: At [facility], you and your health needs are our priority.  As part of our continuing mission to provide you with exceptional heart care, we have created designated Provider Care Teams.  These Care Teams include your primary Cardiologist (physician) and Advanced Practice Providers (APPs -  Physician Assistants and Nurse Practitioners) who all  work together to provide you with the care you need, when you need it.  We recommend signing up for the  patient portal called "MyChart".  Sign up information is provided on this After Visit Summary.  MyChart is used to connect with patients for Virtual Visits (Telemedicine).  Patients are able to view lab/test results, encounter notes, upcoming appointments, etc.  Non-urgent messages can be sent to your provider as well.   To learn more about what you can do with MyChart, go to [email or web address].    Your next appointment:   6 month(s)  The format for your next appointment:   In Person  Provider:   Coletta Memos, NP    Other Instructions Continue daily exercise and continue heart healthy diet. Please call our office if you notice any new symptoms or have any chest pain.   Signed, Elgie Collard, PA-C  10/11/2021 9:48 AM      Notice: This dictation was prepared with Dragon dictation along with smaller phrase technology. Any transcriptional errors that result from this process are unintentional and may not be corrected upon review.  I spent 30 minutes examining this patient, reviewing medications, and using patient centered shared decision making involving her cardiac care.  Prior to her visit I spent greater than 20 minutes reviewing her past medical history,  medications, and prior cardiac tests.

## 2021-10-11 ENCOUNTER — Other Ambulatory Visit: Payer: Self-pay

## 2021-10-11 ENCOUNTER — Ambulatory Visit (HOSPITAL_BASED_OUTPATIENT_CLINIC_OR_DEPARTMENT_OTHER): Payer: PPO | Admitting: Physician Assistant

## 2021-10-11 ENCOUNTER — Encounter (HOSPITAL_BASED_OUTPATIENT_CLINIC_OR_DEPARTMENT_OTHER): Payer: Self-pay | Admitting: General Practice

## 2021-10-11 VITALS — BP 132/74 | HR 63 | Ht 62.0 in | Wt 171.4 lb

## 2021-10-11 DIAGNOSIS — I10 Essential (primary) hypertension: Secondary | ICD-10-CM

## 2021-10-11 DIAGNOSIS — I4891 Unspecified atrial fibrillation: Secondary | ICD-10-CM | POA: Diagnosis not present

## 2021-10-11 DIAGNOSIS — E782 Mixed hyperlipidemia: Secondary | ICD-10-CM | POA: Diagnosis not present

## 2021-10-11 DIAGNOSIS — I420 Dilated cardiomyopathy: Secondary | ICD-10-CM

## 2021-10-11 DIAGNOSIS — I2510 Atherosclerotic heart disease of native coronary artery without angina pectoris: Secondary | ICD-10-CM

## 2021-10-11 DIAGNOSIS — I4819 Other persistent atrial fibrillation: Secondary | ICD-10-CM | POA: Diagnosis not present

## 2021-10-11 DIAGNOSIS — D6869 Other thrombophilia: Secondary | ICD-10-CM | POA: Diagnosis not present

## 2021-10-11 NOTE — Patient Instructions (Signed)
Medication Instructions:  Your Physician recommend you continue on your current medication as directed.    *If you need a refill on your cardiac medications before your next appointment, please call your pharmacy*   Lab Work: Your physician recommends that you return for lab work in:  Lipid panel in 1 year   If you have labs (blood work) drawn today and your tests are completely normal, you will receive your results only by: [place] (if you have [place]) OR A paper copy in the mail If you have any lab test that is abnormal or we need to change your treatment, we will call you to review the results.   Testing/Procedures: None ordered today    Follow-Up: At [facility], you and your health needs are our priority.  As part of our continuing mission to provide you with exceptional heart care, we have created designated Provider Care Teams.  These Care Teams include your primary Cardiologist (physician) and Advanced Practice Providers (APPs -  Physician Assistants and Nurse Practitioners) who all work together to provide you with the care you need, when you need it.  We recommend signing up for the patient portal called "MyChart".  Sign up information is provided on this After Visit Summary.  MyChart is used to connect with patients for Virtual Visits (Telemedicine).  Patients are able to view lab/test results, encounter notes, upcoming appointments, etc.  Non-urgent messages can be sent to your provider as well.   To learn more about what you can do with MyChart, go to [email or web address].    Your next appointment:   6 month(s)  The format for your next appointment:   In Person  Provider:   Fransico Him, MD  or Nicholes Rough, PA-C  }

## 2021-10-16 DIAGNOSIS — D225 Melanocytic nevi of trunk: Secondary | ICD-10-CM | POA: Diagnosis not present

## 2021-10-16 DIAGNOSIS — B001 Herpesviral vesicular dermatitis: Secondary | ICD-10-CM | POA: Diagnosis not present

## 2021-10-16 DIAGNOSIS — L728 Other follicular cysts of the skin and subcutaneous tissue: Secondary | ICD-10-CM | POA: Diagnosis not present

## 2021-10-16 DIAGNOSIS — L570 Actinic keratosis: Secondary | ICD-10-CM | POA: Diagnosis not present

## 2021-10-16 DIAGNOSIS — L720 Epidermal cyst: Secondary | ICD-10-CM | POA: Diagnosis not present

## 2021-10-16 DIAGNOSIS — L821 Other seborrheic keratosis: Secondary | ICD-10-CM | POA: Diagnosis not present

## 2021-10-16 DIAGNOSIS — L814 Other melanin hyperpigmentation: Secondary | ICD-10-CM | POA: Diagnosis not present

## 2021-10-23 DIAGNOSIS — G4733 Obstructive sleep apnea (adult) (pediatric): Secondary | ICD-10-CM | POA: Diagnosis not present

## 2021-11-04 ENCOUNTER — Telehealth: Payer: Self-pay | Admitting: Cardiology

## 2021-11-04 NOTE — Telephone Encounter (Signed)
Yes, we can give 2 from our stock and 2 from refills

## 2021-11-04 NOTE — Telephone Encounter (Signed)
   Patient calling the office for samples of medication:   1.  What medication and dosage are you requesting samples for?apixaban (ELIQUIS) 5 MG TABS tablet  2.  Are you currently out of this medication? 2 more pills left   Pt is in donut hole, will cost her $800+ to get her prescription this month, pt said, when she saw Jinny Blossom, she was told she can be given samples to cover her eliquis this month

## 2021-11-04 NOTE — Telephone Encounter (Signed)
Leaving pt 2 boxes of eliquis 5 mg tablet with Melissa, RPH.

## 2021-11-04 NOTE — Telephone Encounter (Signed)
Hx: afib  Lov: Conte, 10/11/2021 Scr: 0.74, 02/17/2021 Weight: 77.7 kg  Age: 80 yo   Pt is on the correct dose of Eliquis 5mg  BID.

## 2021-11-05 NOTE — Telephone Encounter (Signed)
Left detailed message per DPR that samples were left up front for her to pick up

## 2021-11-05 NOTE — Telephone Encounter (Signed)
Called and LVM for pt to call back. Calling to let pt know that we left 4 boxes of samples of Eliquis up front for her

## 2021-12-04 DIAGNOSIS — Z1211 Encounter for screening for malignant neoplasm of colon: Secondary | ICD-10-CM | POA: Diagnosis not present

## 2021-12-04 DIAGNOSIS — K6289 Other specified diseases of anus and rectum: Secondary | ICD-10-CM | POA: Diagnosis not present

## 2021-12-04 DIAGNOSIS — K5730 Diverticulosis of large intestine without perforation or abscess without bleeding: Secondary | ICD-10-CM | POA: Diagnosis not present

## 2021-12-04 DIAGNOSIS — K3189 Other diseases of stomach and duodenum: Secondary | ICD-10-CM | POA: Diagnosis not present

## 2021-12-04 DIAGNOSIS — R634 Abnormal weight loss: Secondary | ICD-10-CM | POA: Diagnosis not present

## 2021-12-04 DIAGNOSIS — D125 Benign neoplasm of sigmoid colon: Secondary | ICD-10-CM | POA: Diagnosis not present

## 2021-12-04 DIAGNOSIS — K2930 Chronic superficial gastritis without bleeding: Secondary | ICD-10-CM | POA: Diagnosis not present

## 2021-12-04 DIAGNOSIS — R6881 Early satiety: Secondary | ICD-10-CM | POA: Diagnosis not present

## 2021-12-10 DIAGNOSIS — D125 Benign neoplasm of sigmoid colon: Secondary | ICD-10-CM | POA: Diagnosis not present

## 2021-12-10 DIAGNOSIS — K2930 Chronic superficial gastritis without bleeding: Secondary | ICD-10-CM | POA: Diagnosis not present

## 2021-12-20 DIAGNOSIS — M79621 Pain in right upper arm: Secondary | ICD-10-CM | POA: Diagnosis not present

## 2021-12-23 ENCOUNTER — Other Ambulatory Visit: Payer: Self-pay | Admitting: Cardiology

## 2021-12-24 ENCOUNTER — Other Ambulatory Visit: Payer: Self-pay

## 2021-12-24 MED ORDER — ROSUVASTATIN CALCIUM 5 MG PO TABS: 5.0000 mg | ORAL_TABLET | Freq: Every day | ORAL | 2 refills | Status: DC

## 2022-01-21 DIAGNOSIS — G4733 Obstructive sleep apnea (adult) (pediatric): Secondary | ICD-10-CM | POA: Diagnosis not present

## 2022-01-22 ENCOUNTER — Other Ambulatory Visit: Payer: Self-pay

## 2022-01-22 ENCOUNTER — Ambulatory Visit (HOSPITAL_COMMUNITY)
Admission: RE | Admit: 2022-01-22 | Discharge: 2022-01-22 | Disposition: A | Discharge status: Home / Self Care | Payer: HMO | Source: Ambulatory Visit | Attending: Physician Assistant | Admitting: Physician Assistant

## 2022-01-22 ENCOUNTER — Encounter (HOSPITAL_COMMUNITY): Payer: Self-pay | Admitting: Physician Assistant

## 2022-01-22 VITALS — BP 132/70 | HR 53 | Ht 62.0 in | Wt 167.0 lb

## 2022-01-22 DIAGNOSIS — Z6830 Body mass index (BMI) 30.0-30.9, adult: Secondary | ICD-10-CM | POA: Insufficient documentation

## 2022-01-22 DIAGNOSIS — Z9989 Dependence on other enabling machines and devices: Secondary | ICD-10-CM | POA: Diagnosis not present

## 2022-01-22 DIAGNOSIS — G4733 Obstructive sleep apnea (adult) (pediatric): Secondary | ICD-10-CM | POA: Insufficient documentation

## 2022-01-22 DIAGNOSIS — E669 Obesity, unspecified: Secondary | ICD-10-CM | POA: Insufficient documentation

## 2022-01-22 DIAGNOSIS — R002 Palpitations: Secondary | ICD-10-CM

## 2022-01-22 DIAGNOSIS — I2510 Atherosclerotic heart disease of native coronary artery without angina pectoris: Secondary | ICD-10-CM | POA: Diagnosis not present

## 2022-01-22 DIAGNOSIS — I4819 Other persistent atrial fibrillation: Secondary | ICD-10-CM | POA: Insufficient documentation

## 2022-01-22 DIAGNOSIS — E039 Hypothyroidism, unspecified: Secondary | ICD-10-CM | POA: Insufficient documentation

## 2022-01-22 DIAGNOSIS — Z7901 Long term (current) use of anticoagulants: Secondary | ICD-10-CM | POA: Diagnosis not present

## 2022-01-22 DIAGNOSIS — D6869 Other thrombophilia: Secondary | ICD-10-CM | POA: Insufficient documentation

## 2022-01-22 DIAGNOSIS — E785 Hyperlipidemia, unspecified: Secondary | ICD-10-CM | POA: Insufficient documentation

## 2022-01-22 DIAGNOSIS — I10 Essential (primary) hypertension: Secondary | ICD-10-CM | POA: Diagnosis not present

## 2022-01-22 LAB — BASIC METABOLIC PANEL
Anion gap: 12 (ref 5–15)
BUN: 9 mg/dL (ref 8–23)
CO2: 27 mmol/L (ref 22–32)
Calcium: 9 mg/dL (ref 8.9–10.3)
Chloride: 92 mmol/L — ABNORMAL LOW (ref 98–111)
Creatinine, Ser: 0.82 mg/dL (ref 0.44–1.00)
GFR, Estimated: >60 mL/min (ref >60)
Glucose, Bld: 111 mg/dL — ABNORMAL HIGH (ref 70–99)
Potassium: 3.8 mmol/L (ref 3.5–5.1)
Sodium: 131 mmol/L — ABNORMAL LOW (ref 135–145)

## 2022-01-22 LAB — CBC
HCT: 36.5 % (ref 36.0–46.0)
Hemoglobin: 12.4 g/dL (ref 12.0–15.0)
MCH: 31.5 pg (ref 26.0–34.0)
MCHC: 34 g/dL (ref 30.0–36.0)
MCV: 92.6 fL (ref 80.0–100.0)
Platelets: 314 10*3/uL (ref 150–400)
RBC: 3.94 MIL/uL (ref 3.87–5.11)
RDW: 12.4 % (ref 11.5–15.5)
WBC: 8.7 10*3/uL (ref 4.0–10.5)
nRBC: 0 % (ref 0.0–0.2)

## 2022-01-22 NOTE — Progress Notes (Signed)
Primary Care Physician: [name], MD Primary Cardiologist: Dr Radford Pax Primary Electrophysiologist: none Referring Physician: Dr Marzetta Merino is a 81 y.o. female with a history of CAD, HTN, OSA, HLD, hypothyroidism, atrial fibrillation who presents for follow up in the [facility]. The patient was initially diagnosed with atrial fibrillation 01/2021 after presenting to the ED with symptoms of intermittent CP. She was in rate controlled afib. Echo showed EF 45-50% with hypokinetic basal segments. Lexiscan did not show ischemia. She was discharged in rate controlled afib with plan for outpatient DCCV but converted spontaneously on her own. Patient is on Eliquis for a CHADS2VASC score of 5. She reports that she went back into afib since 05/04/21 with symptoms of palpitations and heart racing. There were no specific triggers that she could identify. She is compliant with CPAP therapy.   On follow up today, patient reports that she has almost daily symptoms of palpitations. She has a smart watch which intermittently shows fast heart rates. No other associated symptoms. There are no specific triggers that she can identify. She remains very active doing water aerobics daily.   Today, she denies symptoms of CP, shortness of breath, orthopnea, PND, lower extremity edema, dizziness, presyncope, syncope, bleeding, or neurologic sequela. The patient is tolerating medications without difficulties and is otherwise without complaint today.    Atrial Fibrillation Risk Factors:  she does have symptoms or diagnosis of sleep apnea. she is compliant with CPAP therapy. she does have a history of rheumatic fever. she does not have a history of alcohol use. The patient does not have a history of early familial atrial fibrillation or other arrhythmias.  she has a BMI of Body mass index is 30.54 kg/m.Marland Kitchen Filed Weights   01/22/22 1319  Weight: 75.8 kg      Family History   Problem Relation Age of Onset   Cervical cancer Mother    CAD Sister    Heart attack Father        Died in early 33s with MI   Heart disease Brother    Colon cancer Neg Hx      Atrial Fibrillation Management history:  Previous antiarrhythmic drugs: none Previous cardioversions: none Previous ablations: none CHADS2VASC score: 5 Anticoagulation history: Eliquis   Past Medical History:  Diagnosis Date   Benign essential HTN    CAD (coronary artery disease), native coronary artery    Cath 2012 with 30% LCx   DCM (dilated cardiomyopathy) ([place])    ? tachy mediated from afib with RVR.  EF 45-50% by echo 01/2021.  Lexiscan myoview with no ischemia   Diverticulosis    Hiatal hernia    HLD (hyperlipidemia)    Persistent atrial fibrillation (HCC)    On DOAC   S/P laparoscopic cholecystectomy 07/28/2011   23 years ago   Thyroid goiter    Past Surgical History:  Procedure Laterality Date   child birth     x3   CHOLECYSTECTOMY      Current Outpatient Medications  Medication Sig Dispense Refill   albuterol (VENTOLIN HFA) 108 (90 Base) MCG/ACT inhaler Inhale 2 puffs into the lungs every 4 (four) hours as needed for wheezing or shortness of breath.     ALPRAZolam (XANAX) 0.5 MG tablet Take 0.5 mg by mouth daily as needed for anxiety.     apixaban (ELIQUIS) 5 MG TABS tablet Take 1 tablet (5 mg total) by mouth 2 (two) times daily. 180 tablet 1  Apoaequorin (PREVAGEN) 10 MG CAPS Take 1 capsule by mouth daily.     atenolol (TENORMIN) 100 MG tablet Take 100 mg by mouth daily.     b complex vitamins tablet Take 1 tablet by mouth daily.     calcium carbonate (OS-CAL) 600 MG TABS Take 600 mg by mouth 2 (two) times daily with a meal.     cyclobenzaprine (FLEXERIL) 10 MG tablet Take 5 mg by mouth 3 (three) times daily as needed for muscle spasms.     DILT-XR 120 MG 24 hr capsule Take 120 mg by mouth every morning.     furosemide (LASIX) 20 MG tablet Take 20 mg by mouth as needed.      hydrochlorothiazide (HYDRODIURIL) 12.5 MG tablet Take 12.5 mg by mouth daily.     HYDROcodone-acetaminophen (NORCO/VICODIN) 5-325 MG tablet Take 1 tablet by mouth every 6 (six) hours as needed for moderate pain.     irbesartan (AVAPRO) 300 MG tablet Take 300 mg by mouth every morning.     levothyroxine (SYNTHROID) 25 MCG tablet Take 25 mcg by mouth every morning.     Multiple Vitamin (MULTIVITAMIN) tablet Take 1 tablet by mouth daily.     omeprazole (PRILOSEC OTC) 20 MG tablet Take 20 mg by mouth daily.     promethazine (PHENERGAN) 25 MG tablet Take 25 mg by mouth every 6 (six) hours as needed for nausea or vomiting.     rosuvastatin (CRESTOR) 5 MG tablet Take 1 tablet (5 mg total) by mouth daily. 90 tablet 2   Selenium 100 MCG TABS Take 1 tablet by mouth daily.     valACYclovir (VALTREX) 1000 MG tablet Take 1,000 mg by mouth daily as needed (fever blisters).     vitamin C (ASCORBIC ACID) 500 MG tablet Take 500 mg by mouth daily.     No current facility-administered medications for this encounter.    Allergies  Allergen Reactions   Amlodipine Besylate     Other reaction(s): swelling along with aches and pains   Codeine     REACTION: nausea   Crest Tartar Control     Other reaction(s): skin around mouth comes off   Doxazosin Mesylate     Other reaction(s): SOB   Duloxetine Diarrhea   Duloxetine Hcl     Other reaction(s): Heaviness and pains in chest and back/dry mouth   Gabapentin     Other reaction(s): causes aches and pains   Lyrica [Pregabalin] Other (See Comments)    Made pain in legs and feet worse.   Rosuvastatin Calcium     Other reaction(s): knee and leg pains   Zolpidem Tartrate     Other reaction(s): retrograde amnesia    Social History   Socioeconomic History   Marital status: Widowed    Spouse name: Not on file   Number of children: Not on file   Years of education: Not on file   Highest education level: Not on file  Occupational History   Occupation: retired   Tobacco Use   Smoking status: Never   Smokeless tobacco: Never  Substance and Sexual Activity   Alcohol use: No   Drug use: No    Comment: CBD gummies   Sexual activity: Not on file  Other Topics Concern   Not on file  Social History Narrative   Not on file   Social Determinants of Health   Financial Resource Strain: Not on file  Food Insecurity: Not on file  Transportation Needs: Not on file  Physical Activity: Not on file  Stress: Not on file  Social Connections: Not on file  Intimate Partner Violence: Not on file     ROS- All systems are reviewed and negative except as per the HPI above.  Physical Exam: Vitals:   01/22/22 1319  BP: 132/70  Pulse: (!) 53  Weight: 75.8 kg  Height: 5\' 2"  (1.575 m)    GEN- The patient is a well appearing elderly female, alert and oriented x 3 today.   HEENT-head normocephalic, atraumatic, sclera clear, conjunctiva pink, hearing intact, trachea midline. Lungs- Clear to ausculation bilaterally, normal work of breathing Heart- Regular rate and rhythm, no murmurs, rubs or gallops  GI- soft, NT, ND, + BS Extremities- no clubbing, cyanosis, or edema MS- no significant deformity or atrophy Skin- no rash or lesion Psych- euthymic mood, full affect Neuro- strength and sensation are intact   Wt Readings from Last 3 Encounters:  01/22/22 75.8 kg  10/11/21 77.7 kg  07/24/21 78.5 kg    EKG today demonstrates  SB Vent. rate 53 BPM PR interval 172 ms QRS duration 86 ms QT/QTcB 442/414 ms  Echo 02/15/21 demonstrated  1. Left ventricular ejection fraction, by estimation, is 45 to 50%. The  left ventricle has mildly decreased function. Left ventricular endocardial  border not optimally defined to evaluate regional wall motion. Basal  segments are hypokinetic with best preserved function in mid and apex. Left ventricular diastolic parameters are indeterminate.   2. Right ventricular systolic function is mildly reduced. The right   ventricular size is normal. Tricuspid regurgitation signal is inadequate  for assessing PA pressure.   3. Left atrial size was mildly dilated.   4. Right atrial size was mildly dilated.   5. The mitral valve is normal in structure. Trivial mitral valve  regurgitation. No evidence of mitral stenosis.   6. The aortic valve is grossly normal. There is mild calcification of the  aortic valve. Aortic valve regurgitation is not visualized. No aortic  stenosis is present.   7. The inferior vena cava is normal in size with greater than 50%  respiratory variability, suggesting right atrial pressure of 3 mmHg.   Epic records are reviewed at length today  CHA2DS2-VASc Score = 5  The patient's score is based upon: CHF History: 0 HTN History: 1 Diabetes History: 0 Stroke History: 0 Vascular Disease History: 1 Age Score: 2 Gender Score: 1     ASSESSMENT AND PLAN: 1. Persistent Atrial Fibrillation (ICD10:  I48.19) The patient's CHA2DS2-VASc score is 5, indicating a 7.2% annual risk of stroke.   Patient having frequent palpitations, smart watch shows intermittent elevated heart rates, no rhythm strips. Will have her wear a Zio monitor to assess rhythm burden as well as rate.  Continue diltiazem 30 mg PRN q 4 hours for heart racing.  2. Secondary Hypercoagulable State (ICD10:  D68.69) The patient is at significant risk for stroke/thromboembolism based upon her CHA2DS2-VASc Score of 5.  Continue Apixaban (Eliquis).   3. Obesity Body mass index is 30.54 kg/m. Lifestyle modification was discussed and encouraged including regular physical activity and weight reduction.  4. Obstructive sleep apnea Patient reports compliance with CPAP therapy.  5. CAD Non obstructive by [facility] 2012 No anginal symptoms.  6. HTN Stable, no changes today.   Follow up in the AF clinic in 3-4 weeks.    [facility] [address] [place], [place]  [address] [phone] 01/22/2022 1:33 PM

## 2022-01-31 ENCOUNTER — Emergency Department (HOSPITAL_BASED_OUTPATIENT_CLINIC_OR_DEPARTMENT_OTHER): Payer: HMO

## 2022-01-31 ENCOUNTER — Encounter (HOSPITAL_BASED_OUTPATIENT_CLINIC_OR_DEPARTMENT_OTHER): Payer: Self-pay | Admitting: Emergency Medicine

## 2022-01-31 ENCOUNTER — Other Ambulatory Visit: Payer: Self-pay

## 2022-01-31 ENCOUNTER — Emergency Department (HOSPITAL_BASED_OUTPATIENT_CLINIC_OR_DEPARTMENT_OTHER)
Admission: EM | Admit: 2022-01-31 | Discharge: 2022-01-31 | Disposition: A | Discharge status: Home / Self Care | Payer: HMO | Attending: Emergency Medicine | Admitting: Emergency Medicine

## 2022-01-31 ENCOUNTER — Other Ambulatory Visit (HOSPITAL_BASED_OUTPATIENT_CLINIC_OR_DEPARTMENT_OTHER): Payer: Self-pay

## 2022-01-31 DIAGNOSIS — R519 Headache, unspecified: Secondary | ICD-10-CM | POA: Diagnosis not present

## 2022-01-31 DIAGNOSIS — Z79899 Other long term (current) drug therapy: Secondary | ICD-10-CM | POA: Diagnosis not present

## 2022-01-31 DIAGNOSIS — N390 Urinary tract infection, site not specified: Secondary | ICD-10-CM | POA: Insufficient documentation

## 2022-01-31 DIAGNOSIS — R42 Dizziness and giddiness: Secondary | ICD-10-CM | POA: Insufficient documentation

## 2022-01-31 DIAGNOSIS — R55 Syncope and collapse: Secondary | ICD-10-CM | POA: Diagnosis not present

## 2022-01-31 DIAGNOSIS — R002 Palpitations: Secondary | ICD-10-CM | POA: Diagnosis not present

## 2022-01-31 DIAGNOSIS — R112 Nausea with vomiting, unspecified: Secondary | ICD-10-CM | POA: Diagnosis not present

## 2022-01-31 LAB — CBC
HCT: 35.6 % — ABNORMAL LOW (ref 36.0–46.0)
Hemoglobin: 12.6 g/dL (ref 12.0–15.0)
MCH: 31.1 pg (ref 26.0–34.0)
MCHC: 35.4 g/dL (ref 30.0–36.0)
MCV: 87.9 fL (ref 80.0–100.0)
Platelets: 297 10*3/uL (ref 150–400)
RBC: 4.05 MIL/uL (ref 3.87–5.11)
RDW: 12.2 % (ref 11.5–15.5)
WBC: 7.4 10*3/uL (ref 4.0–10.5)
nRBC: 0 % (ref 0.0–0.2)

## 2022-01-31 LAB — BASIC METABOLIC PANEL
Anion gap: 13 (ref 5–15)
BUN: 14 mg/dL (ref 8–23)
CO2: 25 mmol/L (ref 22–32)
Calcium: 9.5 mg/dL (ref 8.9–10.3)
Chloride: 92 mmol/L — ABNORMAL LOW (ref 98–111)
Creatinine, Ser: 0.75 mg/dL (ref 0.44–1.00)
GFR, Estimated: >60 mL/min (ref >60)
Glucose, Bld: 110 mg/dL — ABNORMAL HIGH (ref 70–99)
Potassium: 3.2 mmol/L — ABNORMAL LOW (ref 3.5–5.1)
Sodium: 130 mmol/L — ABNORMAL LOW (ref 135–145)

## 2022-01-31 LAB — TROPONIN I (HIGH SENSITIVITY): Troponin I (High Sensitivity): 9 ng/L (ref <18)

## 2022-01-31 LAB — URINALYSIS, ROUTINE W REFLEX MICROSCOPIC
Bilirubin Urine: NEGATIVE
Glucose, UA: NEGATIVE mg/dL
Hgb urine dipstick: NEGATIVE
Ketones, ur: NEGATIVE mg/dL
Nitrite: NEGATIVE
Specific Gravity, Urine: 1.01 (ref 1.005–1.030)
pH: 6.5 (ref 5.0–8.0)

## 2022-01-31 LAB — CBG MONITORING, ED: Glucose-Capillary: 110 mg/dL — ABNORMAL HIGH (ref 70–99)

## 2022-01-31 MED ORDER — CEPHALEXIN 500 MG PO CAPS
500.0000 mg | ORAL_CAPSULE | Freq: Three times a day (TID) | ORAL | 0 refills | Status: AC
  Filled 2022-01-31: qty 15, 5d supply, fill #0

## 2022-01-31 NOTE — ED Triage Notes (Signed)
Pt states for about a week and a half she has had periods of feeling overheated, headache and feels like she could pass out. The moments last no more than 30 mins. Pt was seen by PCP this morning and was advised to come for a CT scan. Pt denies any abnormal feelings at this current moment but says that it happens multiple times a day.  ?

## 2022-01-31 NOTE — ED Notes (Signed)
Patient transported to CT 

## 2022-01-31 NOTE — ED Triage Notes (Signed)
Also complains of nausea and vomiting. This morning vomited up green emesis.  ?

## 2022-01-31 NOTE — ED Provider Notes (Addendum)
[facility] EMERGENCY DEPT Provider Note   CSN: [id number] Arrival date & time: 01/31/22  1225     History  Chief Complaint  Patient presents with   Headache   Near Syncope    Tabitha Murphy is a 81 y.o. female.  Patient presents ER chief complaint of intermittent episodes of lightheadedness palpitations and dizziness associate with nausea.  Symptoms typically last about 2 to 5 minutes.  She has had multiple symptoms over the course the last week or so.  She seen her primary care doctor about this and they put in a Zio patch and she is pending further evaluation.  She was sent to the ER for CT imaging of the brain she states.  Currently she states she is not having any symptoms and feels fine.  Has no headache no chest pain no abdominal pain.  No reports of fevers or cough or vomiting or diarrhea.  No reports of passing out.      Home Medications Prior to Admission medications   Medication Sig Start Date End Date Taking? Authorizing Provider  cephALEXin (KEFLEX) 500 MG capsule Take 1 capsule (500 mg total) by mouth 3 (three) times daily for 5 days. 01/31/22 02/05/22 Yes Aarnav Steagall, Greggory Brandy, MD  albuterol (VENTOLIN HFA) 108 (90 Base) MCG/ACT inhaler Inhale 2 puffs into the lungs every 4 (four) hours as needed for wheezing or shortness of breath. 03/18/20   [provider]  ALPRAZolam Duanne Moron) 0.5 MG tablet Take 0.5 mg by mouth daily as needed for anxiety.    [provider]  apixaban (ELIQUIS) 5 MG TABS tablet Take 1 tablet (5 mg total) by mouth 2 (two) times daily. 07/22/21   Sueanne Margarita, MD  Apoaequorin (PREVAGEN) 10 MG CAPS Take 1 capsule by mouth daily.    [provider]  atenolol (TENORMIN) 100 MG tablet Take 100 mg by mouth daily.    [provider]  b complex vitamins tablet Take 1 tablet by mouth daily.    [provider]  calcium carbonate (OS-CAL) 600 MG TABS Take 600 mg by mouth 2 (two) times daily with a meal.    [provider]  cyclobenzaprine (FLEXERIL) 10 MG tablet Take 5 mg by mouth 3 (three) times daily as needed for muscle spasms. 01/12/21   [provider]  DILT-XR 120 MG 24 hr capsule Take 120 mg by mouth every morning. 09/02/21   [provider]  furosemide (LASIX) 20 MG tablet Take 20 mg by mouth as needed.    [provider]  hydrochlorothiazide (HYDRODIURIL) 12.5 MG tablet Take 12.5 mg by mouth daily.    [provider]  HYDROcodone-acetaminophen (NORCO/VICODIN) 5-325 MG tablet Take 1 tablet by mouth every 6 (six) hours as needed for moderate pain.    [provider]  irbesartan (AVAPRO) 300 MG tablet Take 300 mg by mouth every morning. 02/02/21   [provider]  levothyroxine (SYNTHROID) 25 MCG tablet Take 25 mcg by mouth every morning. 02/25/21   [provider]  Multiple Vitamin (MULTIVITAMIN) tablet Take 1 tablet by mouth daily.    [provider]  omeprazole (PRILOSEC OTC) 20 MG tablet Take 20 mg by mouth daily.    [provider]  promethazine (PHENERGAN) 25 MG tablet Take 25 mg by mouth every 6 (six) hours as needed for nausea or vomiting.    [provider]  rosuvastatin (CRESTOR) 5 MG tablet Take 1 tablet (5 mg total) by mouth daily. 12/24/21  Sueanne Margarita, MD  Selenium 100 MCG TABS Take 1 tablet by mouth daily.    [provider]  valACYclovir (VALTREX) 1000 MG tablet Take 1,000 mg by mouth daily as needed (fever blisters).    [provider]  vitamin C (ASCORBIC ACID) 500 MG tablet Take 500 mg by mouth daily.    [provider]      Allergies    Amlodipine besylate, Codeine, Crest tartar control, Doxazosin mesylate, Duloxetine, Duloxetine hcl, Gabapentin, Lyrica [pregabalin], Rosuvastatin calcium, and Zolpidem tartrate    Review of Systems   Review of Systems  Constitutional:  Negative for fever.  HENT:  Negative for ear pain.   Eyes:  Negative for pain.   Respiratory:  Negative for cough.   Cardiovascular:  Positive for palpitations. Negative for chest pain.  Gastrointestinal:  Negative for abdominal pain.  Genitourinary:  Negative for flank pain.  Musculoskeletal:  Negative for back pain.  Skin:  Negative for rash.  Neurological:  Positive for light-headedness. Negative for headaches.   Physical Exam Updated Vital Signs BP (!) 143/62    Pulse (!) 57    Temp 98.3 F (36.8 C)    Resp 14    Ht 5\' 2"  (1.575 m)    Wt 75.8 kg    SpO2 96%    BMI 30.54 kg/m  Physical Exam Constitutional:      General: She is not in acute distress.    Appearance: Normal appearance.  HENT:     Head: Normocephalic.     Nose: Nose normal.  Eyes:     Extraocular Movements: Extraocular movements intact.  Cardiovascular:     Heart sounds: No murmur heard.   No friction rub. No gallop.     Comments: Mildly bradycardic Pulmonary:     Effort: Pulmonary effort is normal.  Musculoskeletal:        General: Normal range of motion.     Cervical back: Normal range of motion.  Neurological:     General: No focal deficit present.     Mental Status: She is alert. Mental status is at baseline.    ED Results / Procedures / Treatments   Labs (all labs ordered are listed, but only abnormal results are displayed) Labs Reviewed  BASIC METABOLIC PANEL - Abnormal; Notable for the following components:      Result Value   Sodium 130 (*)    Potassium 3.2 (*)    Chloride 92 (*)    Glucose, Bld 110 (*)    All other components within normal limits  CBC - Abnormal; Notable for the following components:   HCT 35.6 (*)    All other components within normal limits  URINALYSIS, ROUTINE W REFLEX MICROSCOPIC - Abnormal; Notable for the following components:   Protein, ur TRACE (*)    Leukocytes,Ua SMALL (*)    Bacteria, UA FEW (*)    All other components within normal limits  CBG MONITORING, ED - Abnormal; Notable for the following components:   Glucose-Capillary 110 (*)     All other components within normal limits  TROPONIN I (HIGH SENSITIVITY)    EKG EKG Interpretation  Date/Time:  Friday January 31 2022 12:38:10 EST Ventricular Rate:  62 PR Interval:  144 QRS Duration: 92 QT Interval:  425 QTC Calculation: 432 R Axis:   52 Text Interpretation: Sinus rhythm Confirmed by Thamas Jaegers ([id number]) on 01/31/2022 12:46:51 PM  Radiology CT HEAD WO CONTRAST  Result Date: 01/31/2022 CLINICAL DATA:  Headache EXAM: CT  HEAD WITHOUT CONTRAST TECHNIQUE: Contiguous axial images were obtained from the base of the skull through the vertex without intravenous contrast. RADIATION DOSE REDUCTION: This exam was performed according to the departmental dose-optimization program which includes automated exposure control, adjustment of the mA and/or kV according to patient size and/or use of iterative reconstruction technique. COMPARISON:  CT head 03/11/2014 FINDINGS: Brain: No acute intracranial hemorrhage, mass effect, or herniation. No extra-axial fluid collections. No evidence of acute territorial infarct. No hydrocephalus. Mild cortical volume loss. Patchy hypodensities throughout the periventricular and subcortical white matter, likely secondary to chronic microvascular ischemic changes. Vascular: Calcified plaques in the carotid siphons. Skull: Normal. Negative for fracture or focal lesion. Sinuses/Orbits: No acute finding. Other: Note is made of a partially calcified subcutaneous well-defined mass in the frontal scalp measuring 1.5 cm, chronic. IMPRESSION: Chronic changes with no acute intracranial process identified. Electronically Signed   By: Ofilia Neas M.D.   On: 01/31/2022 13:14    Procedures Procedures    Medications Ordered in ED Medications - No data to display  ED Course/ Medical Decision Making/ A&P                           Medical Decision Making Patient monitored on telemetry showing atrial fibrillation, mildly bradycardic rate.  Presents for work-up of  lightheadedness.  Comorbidities include prior cardiac disease, atrial fibrillation.  Amount and/or Complexity of Data Reviewed Independent Historian: parent External Data Reviewed: notes.    Details: Patient seen by cardiologist in February for atrial fibrillation.. Labs: ordered.    Details: CBC normal chemistry normal.  Urinalysis shows evidence of infection.  Given antibiotic prescription. Radiology: ordered.    Details: CT imaging of the brain, read by radiologist as no acute findings.  Risk Prescription drug management. Risk Details: Patient remains asymptomatic at this time.  Work-up is negative for any acute findings.  She has a zio patch already on.  Recommending close follow-up with her doctor within this week.  Recommending immediate return for worsening symptoms or any additional concerns.            Final Clinical Impression(s) / ED Diagnoses Final diagnoses:  Palpitations  Dizziness  Urinary tract infection without hematuria, site unspecified    Rx / DC Orders ED Discharge Orders          Ordered    cephALEXin (KEFLEX) 500 MG capsule  3 times daily        01/31/22 1419              Luna Fuse, MD 01/31/22 1409    Luna Fuse, MD 01/31/22 1419

## 2022-01-31 NOTE — Discharge Instructions (Addendum)
Call your primary care doctor or specialist as discussed in the next 2-3 days.   Return immediately back to the ER if:  Your symptoms worsen within the next 12-24 hours. You develop new symptoms such as new fevers, persistent vomiting, new pain, shortness of breath, or new weakness or numbness, or if you have any other concerns.  

## 2022-02-04 DIAGNOSIS — G4733 Obstructive sleep apnea (adult) (pediatric): Secondary | ICD-10-CM | POA: Diagnosis not present

## 2022-02-11 DIAGNOSIS — R002 Palpitations: Secondary | ICD-10-CM | POA: Diagnosis not present

## 2022-02-18 ENCOUNTER — Encounter (HOSPITAL_COMMUNITY): Payer: Self-pay | Admitting: Physician Assistant

## 2022-02-18 ENCOUNTER — Ambulatory Visit (HOSPITAL_COMMUNITY)
Admission: RE | Admit: 2022-02-18 | Discharge: 2022-02-18 | Disposition: A | Discharge status: Home / Self Care | Payer: HMO | Source: Ambulatory Visit | Attending: Physician Assistant | Admitting: Physician Assistant

## 2022-02-18 ENCOUNTER — Other Ambulatory Visit: Payer: Self-pay

## 2022-02-18 VITALS — BP 134/64 | HR 51 | Ht 62.0 in | Wt 169.8 lb

## 2022-02-18 DIAGNOSIS — I4819 Other persistent atrial fibrillation: Secondary | ICD-10-CM | POA: Diagnosis not present

## 2022-02-18 DIAGNOSIS — I10 Essential (primary) hypertension: Secondary | ICD-10-CM | POA: Insufficient documentation

## 2022-02-18 DIAGNOSIS — E669 Obesity, unspecified: Secondary | ICD-10-CM | POA: Diagnosis not present

## 2022-02-18 DIAGNOSIS — I2510 Atherosclerotic heart disease of native coronary artery without angina pectoris: Secondary | ICD-10-CM | POA: Insufficient documentation

## 2022-02-18 DIAGNOSIS — E785 Hyperlipidemia, unspecified: Secondary | ICD-10-CM | POA: Diagnosis not present

## 2022-02-18 DIAGNOSIS — Z7901 Long term (current) use of anticoagulants: Secondary | ICD-10-CM | POA: Insufficient documentation

## 2022-02-18 DIAGNOSIS — Z6831 Body mass index (BMI) 31.0-31.9, adult: Secondary | ICD-10-CM | POA: Insufficient documentation

## 2022-02-18 DIAGNOSIS — G4733 Obstructive sleep apnea (adult) (pediatric): Secondary | ICD-10-CM | POA: Diagnosis not present

## 2022-02-18 DIAGNOSIS — E039 Hypothyroidism, unspecified: Secondary | ICD-10-CM | POA: Diagnosis not present

## 2022-02-18 DIAGNOSIS — D6869 Other thrombophilia: Secondary | ICD-10-CM | POA: Insufficient documentation

## 2022-02-18 DIAGNOSIS — Z79899 Other long term (current) drug therapy: Secondary | ICD-10-CM | POA: Diagnosis not present

## 2022-02-18 NOTE — Progress Notes (Signed)
? ? ?Primary Care Physician: [name], MD ?Primary Cardiologist: Dr Radford Pax ?Primary Electrophysiologist: none ?Referring Physician: Dr Radford Pax ? ? ?Tabitha Murphy is a 81 y.o. female with a history of CAD, HTN, OSA, HLD, hypothyroidism, atrial fibrillation who presents for follow up in the [facility]. The patient was initially diagnosed with atrial fibrillation 01/2021 after presenting to the ED with symptoms of intermittent CP. She was in rate controlled afib. Echo showed EF 45-50% with hypokinetic basal segments. Lexiscan did not show ischemia. She was discharged in rate controlled afib with plan for outpatient DCCV but converted spontaneously on her own. Patient is on Eliquis for a CHADS2VASC score of 5. She reports that she went back into afib since 05/04/21 with symptoms of palpitations and heart racing. There were no specific triggers that she could identify. She is compliant with CPAP therapy.  ? ?On follow up today, patient reports that she felt unwell while wearing the 14 day Zio monitor. She had episodes of feeling "hot" all over and some lightheadedness. The monitor showed no afib or sustained arrhythmias, patient triggered events were associated with SR. She went to the ED 01/31/22 for those symptoms and ECG showed NSR. Head CT was normal. She was started on antibiotics for a suspected UTI. ? ?Today, she denies symptoms of CP, shortness of breath, orthopnea, PND, lower extremity edema, syncope, bleeding, or neurologic sequela. The patient is tolerating medications without difficulties and is otherwise without complaint today.  ? ? ?Atrial Fibrillation Risk Factors: ? ?she does have symptoms or diagnosis of sleep apnea. ?she is compliant with CPAP therapy. ?she does have a history of rheumatic fever. ?she does not have a history of alcohol use. ?The patient does not have a history of early familial atrial fibrillation or other arrhythmias. ? ?she has a BMI of Body mass index is  31.06 kg/m?Marland KitchenMarland Kitchen ?Filed Weights  ? 02/18/22 1403  ?Weight: 77 kg  ? ? ? ?Family History  ?Problem Relation Age of Onset  ? Cervical cancer Mother   ? CAD Sister   ? Heart attack Father   ?     Died in early 8s with MI  ? Heart disease Brother   ? Colon cancer Neg Hx   ? ? ? ?Atrial Fibrillation Management history: ? ?Previous antiarrhythmic drugs: none ?Previous cardioversions: none ?Previous ablations: none ?CHADS2VASC score: 5 ?Anticoagulation history: Eliquis ? ? ?Past Medical History:  ?Diagnosis Date  ? Benign essential HTN   ? CAD (coronary artery disease), native coronary artery   ? Cath 2012 with 30% LCx  ? DCM (dilated cardiomyopathy) ([place])   ? ? tachy mediated from afib with RVR.  EF 45-50% by echo 01/2021.  Lexiscan myoview with no ischemia  ? Diverticulosis   ? Hiatal hernia   ? HLD (hyperlipidemia)   ? Persistent atrial fibrillation ([place])   ? On DOAC  ? S/P laparoscopic cholecystectomy 07/28/2011  ? 23 years ago  ? Thyroid goiter   ? ?Past Surgical History:  ?Procedure Laterality Date  ? child birth    ? x3  ? CHOLECYSTECTOMY    ? ? ?Current Outpatient Medications  ?Medication Sig Dispense Refill  ? albuterol (VENTOLIN HFA) 108 (90 Base) MCG/ACT inhaler Inhale 2 puffs into the lungs every 4 (four) hours as needed for wheezing or shortness of breath.    ? ALPRAZolam (XANAX) 0.5 MG tablet Take 0.5 mg by mouth daily as needed for anxiety.    ? apixaban (ELIQUIS) 5 MG  TABS tablet Take 1 tablet (5 mg total) by mouth 2 (two) times daily. 180 tablet 1  ? Apoaequorin (PREVAGEN) 10 MG CAPS Take 1 capsule by mouth daily.    ? atenolol (TENORMIN) 100 MG tablet Take 100 mg by mouth daily.    ? b complex vitamins tablet Take 1 tablet by mouth daily.    ? calcium carbonate (OS-CAL) 600 MG TABS Take 600 mg by mouth 2 (two) times daily with a meal.    ? cyclobenzaprine (FLEXERIL) 10 MG tablet Take 5 mg by mouth 3 (three) times daily as needed for muscle spasms.    ? DILT-XR 120 MG 24 hr capsule Take 120 mg by mouth every  morning.    ? furosemide (LASIX) 20 MG tablet Take 20 mg by mouth as needed.    ? hydrochlorothiazide (HYDRODIURIL) 12.5 MG tablet Take 12.5 mg by mouth daily.    ? HYDROcodone-acetaminophen (NORCO/VICODIN) 5-325 MG tablet Take 1 tablet by mouth every 6 (six) hours as needed for moderate pain.    ? irbesartan (AVAPRO) 300 MG tablet Take 300 mg by mouth every morning.    ? levothyroxine (SYNTHROID) 25 MCG tablet Take 25 mcg by mouth every morning.    ? Multiple Vitamin (MULTIVITAMIN) tablet Take 1 tablet by mouth daily.    ? omeprazole (PRILOSEC OTC) 20 MG tablet Take 20 mg by mouth daily.    ? promethazine (PHENERGAN) 25 MG tablet Take 25 mg by mouth every 6 (six) hours as needed for nausea or vomiting.    ? rosuvastatin (CRESTOR) 5 MG tablet Take 1 tablet (5 mg total) by mouth daily. 90 tablet 2  ? Selenium 100 MCG TABS Take 1 tablet by mouth daily.    ? valACYclovir (VALTREX) 1000 MG tablet Take 1,000 mg by mouth daily as needed (fever blisters).    ? vitamin C (ASCORBIC ACID) 500 MG tablet Take 500 mg by mouth daily.    ? ?No current facility-administered medications for this encounter.  ? ? ?Allergies  ?Allergen Reactions  ? Amlodipine Besylate   ?  Other reaction(s): swelling along with aches and pains  ? Codeine   ?  REACTION: nausea  ? [place] Control   ?  Other reaction(s): skin around mouth comes off  ? Doxazosin Mesylate   ?  Other reaction(s): SOB  ? Duloxetine Diarrhea  ? Duloxetine Hcl   ?  Other reaction(s): Heaviness and pains in chest and back/dry mouth  ? Gabapentin   ?  Other reaction(s): causes aches and pains  ? Lyrica [Pregabalin] Other (See Comments)  ?  Made pain in legs and feet worse.  ? Rosuvastatin Calcium   ?  Other reaction(s): knee and leg pains  ? Zolpidem Tartrate   ?  Other reaction(s): retrograde amnesia  ? ? ?Social History  ? ?Socioeconomic History  ? Marital status: Widowed  ?  Spouse name: Not on file  ? Number of children: Not on file  ? Years of education: Not on file  ?  Highest education level: Not on file  ?Occupational History  ? Occupation: retired  ?Tobacco Use  ? Smoking status: Never  ? Smokeless tobacco: Never  ? Tobacco comments:  ?  Never smoke 02/18/22  ?Substance and Sexual Activity  ? Alcohol use: No  ? Drug use: No  ?  Comment: CBD gummies  ? Sexual activity: Not on file  ?Other Topics Concern  ? Not on file  ?Social History Narrative  ? Not  on file  ? ?Social Determinants of Health  ? ?Financial Resource Strain: Not on file  ?Food Insecurity: Not on file  ?Transportation Needs: Not on file  ?Physical Activity: Not on file  ?Stress: Not on file  ?Social Connections: Not on file  ?Intimate Partner Violence: Not on file  ? ? ? ?ROS- All systems are reviewed and negative except as per the HPI above. ? ?Physical Exam: ?Vitals:  ? 02/18/22 1403  ?BP: 134/64  ?Pulse: (!) 51  ?Weight: 77 kg  ?Height: '5\' 2"'$  (1.575 m)  ? ? ?GEN- The patient is a well appearing elderly obese female, alert and oriented x 3 today.   ?HEENT-head normocephalic, atraumatic, sclera clear, conjunctiva pink, hearing intact, trachea midline. ?Lungs- Clear to ausculation bilaterally, normal work of breathing ?Heart- Regular rate and rhythm, bradycardia, no murmurs, rubs or gallops  ?GI- soft, NT, ND, + BS ?Extremities- no clubbing, cyanosis, or edema ?MS- no significant deformity or atrophy ?Skin- no rash or lesion ?Psych- euthymic mood, full affect ?Neuro- strength and sensation are intact ? ? ?Wt Readings from Last 3 Encounters:  ?02/18/22 77 kg  ?01/31/22 75.8 kg  ?01/22/22 75.8 kg  ? ? ?EKG today demonstrates  ?SB ?Vent. rate 51 BPM ?PR interval 152 ms ?QRS duration 86 ms ?QT/QTcB 432/398 ms ? ?Echo 02/15/21 demonstrated  ?1. Left ventricular ejection fraction, by estimation, is 45 to 50%. The  ?left ventricle has mildly decreased function. Left ventricular endocardial  ?border not optimally defined to evaluate regional wall motion. Basal  ?segments are hypokinetic with best preserved function in mid  and apex. Left ventricular diastolic parameters are indeterminate.  ? 2. Right ventricular systolic function is mildly reduced. The right  ?ventricular size is normal. Tricuspid regurgitation signal is

## 2022-02-20 DIAGNOSIS — E039 Hypothyroidism, unspecified: Secondary | ICD-10-CM | POA: Diagnosis not present

## 2022-02-20 DIAGNOSIS — E7800 Pure hypercholesterolemia, unspecified: Secondary | ICD-10-CM | POA: Diagnosis not present

## 2022-02-20 DIAGNOSIS — I10 Essential (primary) hypertension: Secondary | ICD-10-CM | POA: Diagnosis not present

## 2022-02-20 DIAGNOSIS — J45909 Unspecified asthma, uncomplicated: Secondary | ICD-10-CM | POA: Diagnosis not present

## 2022-02-20 DIAGNOSIS — I4891 Unspecified atrial fibrillation: Secondary | ICD-10-CM | POA: Diagnosis not present

## 2022-02-20 DIAGNOSIS — G4700 Insomnia, unspecified: Secondary | ICD-10-CM | POA: Diagnosis not present

## 2022-02-20 DIAGNOSIS — F324 Major depressive disorder, single episode, in partial remission: Secondary | ICD-10-CM | POA: Diagnosis not present

## 2022-02-27 ENCOUNTER — Other Ambulatory Visit: Payer: Self-pay | Admitting: Cardiology

## 2022-02-28 NOTE — Telephone Encounter (Signed)
Prescription refill request for Eliquis received. ?Indication: afib  ?Last office visit: 02/18/2022, [place] ?Scr:  0.75, 01/31/2022 ?Age: 81 yo  ?Weight: 77 kg  ? ?Refill sent.  ?

## 2022-03-06 DIAGNOSIS — R0789 Other chest pain: Secondary | ICD-10-CM | POA: Diagnosis not present

## 2022-03-06 DIAGNOSIS — S0990XA Unspecified injury of head, initial encounter: Secondary | ICD-10-CM | POA: Diagnosis not present

## 2022-03-06 DIAGNOSIS — W1830XA Fall on same level, unspecified, initial encounter: Secondary | ICD-10-CM | POA: Diagnosis not present

## 2022-03-06 DIAGNOSIS — Z743 Need for continuous supervision: Secondary | ICD-10-CM | POA: Diagnosis not present

## 2022-03-06 DIAGNOSIS — T1490XA Injury, unspecified, initial encounter: Secondary | ICD-10-CM | POA: Diagnosis not present

## 2022-03-06 DIAGNOSIS — S3993XA Unspecified injury of pelvis, initial encounter: Secondary | ICD-10-CM | POA: Diagnosis not present

## 2022-03-06 DIAGNOSIS — M542 Cervicalgia: Secondary | ICD-10-CM | POA: Diagnosis not present

## 2022-03-06 DIAGNOSIS — S299XXA Unspecified injury of thorax, initial encounter: Secondary | ICD-10-CM | POA: Diagnosis not present

## 2022-03-06 DIAGNOSIS — S199XXA Unspecified injury of neck, initial encounter: Secondary | ICD-10-CM | POA: Diagnosis not present

## 2022-03-06 DIAGNOSIS — S3991XA Unspecified injury of abdomen, initial encounter: Secondary | ICD-10-CM | POA: Diagnosis not present

## 2022-03-06 DIAGNOSIS — R519 Headache, unspecified: Secondary | ICD-10-CM | POA: Diagnosis not present

## 2022-03-14 DIAGNOSIS — S0003XA Contusion of scalp, initial encounter: Secondary | ICD-10-CM | POA: Diagnosis not present

## 2022-03-14 DIAGNOSIS — M542 Cervicalgia: Secondary | ICD-10-CM | POA: Diagnosis not present

## 2022-04-09 DIAGNOSIS — K219 Gastro-esophageal reflux disease without esophagitis: Secondary | ICD-10-CM | POA: Diagnosis not present

## 2022-04-09 DIAGNOSIS — E039 Hypothyroidism, unspecified: Secondary | ICD-10-CM | POA: Diagnosis not present

## 2022-04-09 DIAGNOSIS — I4891 Unspecified atrial fibrillation: Secondary | ICD-10-CM | POA: Diagnosis not present

## 2022-04-09 DIAGNOSIS — E7800 Pure hypercholesterolemia, unspecified: Secondary | ICD-10-CM | POA: Diagnosis not present

## 2022-04-09 DIAGNOSIS — I10 Essential (primary) hypertension: Secondary | ICD-10-CM | POA: Diagnosis not present

## 2022-04-09 DIAGNOSIS — J45909 Unspecified asthma, uncomplicated: Secondary | ICD-10-CM | POA: Diagnosis not present

## 2022-04-21 DIAGNOSIS — M25561 Pain in right knee: Secondary | ICD-10-CM | POA: Diagnosis not present

## 2022-04-21 DIAGNOSIS — M1711 Unilateral primary osteoarthritis, right knee: Secondary | ICD-10-CM | POA: Diagnosis not present

## 2022-04-29 IMAGING — MR MR LUMBAR SPINE W/O CM
4 of 5 series · 26 of 48 positions shown · non-contrast
Comparison: MRI lumbar spine 11/16/2014

CLINICAL DATA: Increased low back pain and bilateral leg pain.

EXAM:
MRI LUMBAR SPINE WITHOUT CONTRAST
TECHNIQUE: Multiplanar, multisequence MR imaging of the lumbar spine was
performed. No intravenous contrast was administered.

[Series 2: T2 · sagittal · 4.0mm · 0.53mm/px · 6 of 16 slices shown (1 of 2)]
[im 1/16]
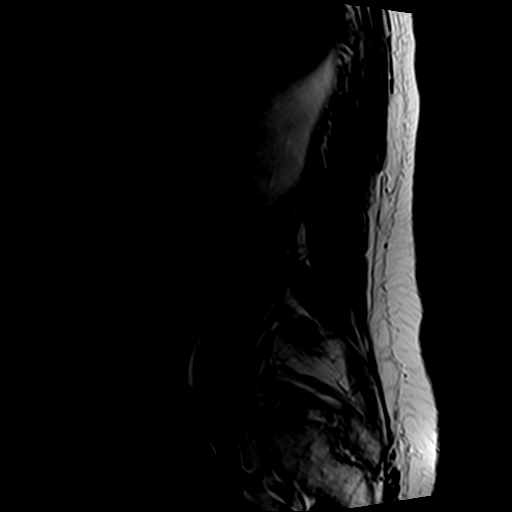
[im 4/16]
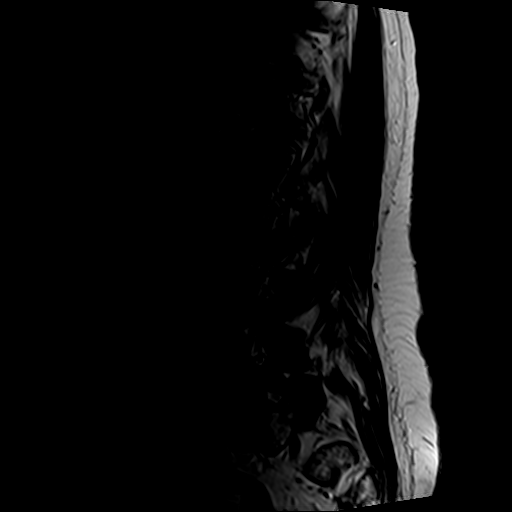
[im 7/16]
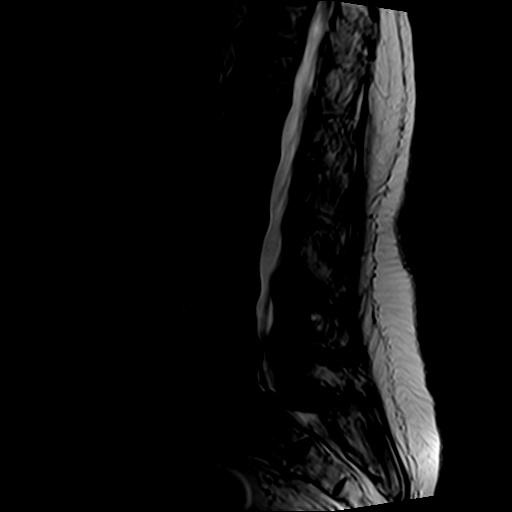
[im 10/16]
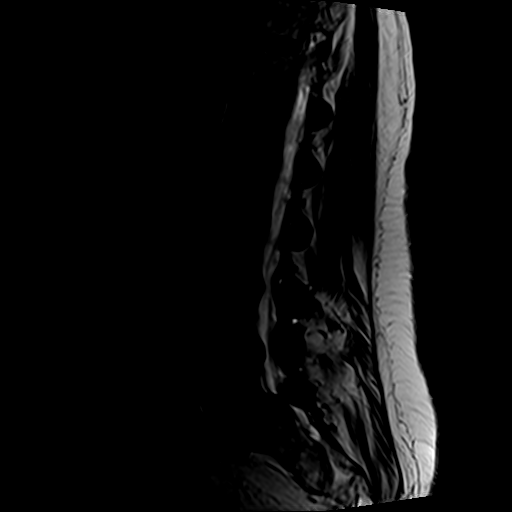
[im 13/16]
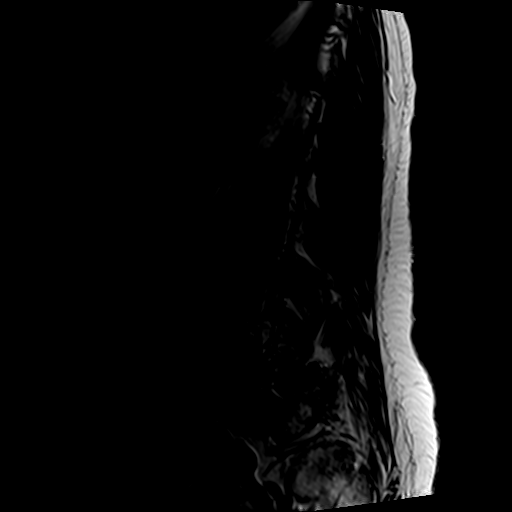
[im 16/16]
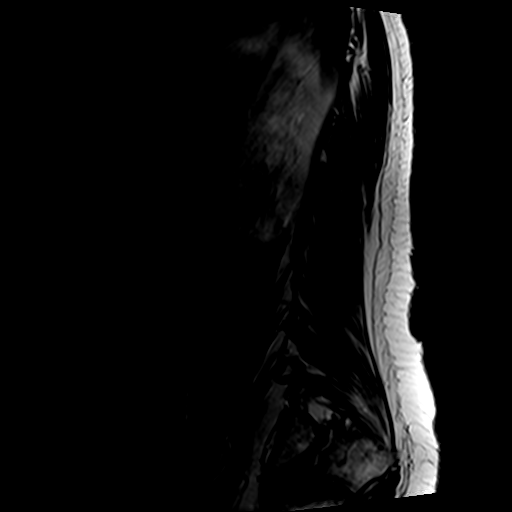

[Series 4: T1 · sagittal · 4.0mm · 0.53mm/px · 6 of 16 slices shown (1 of 2)]
[im 1/16]
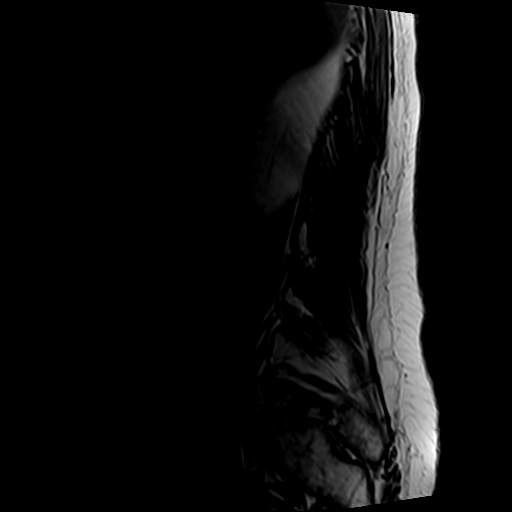
[im 4/16]
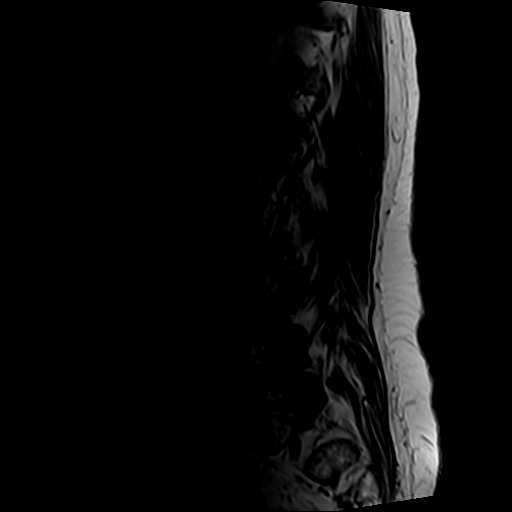
[im 7/16]
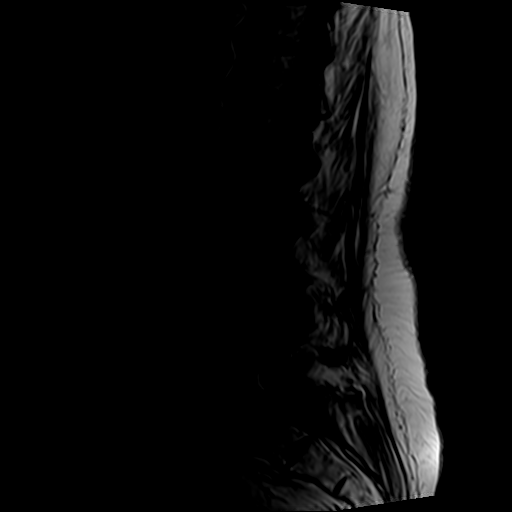
[im 10/16]
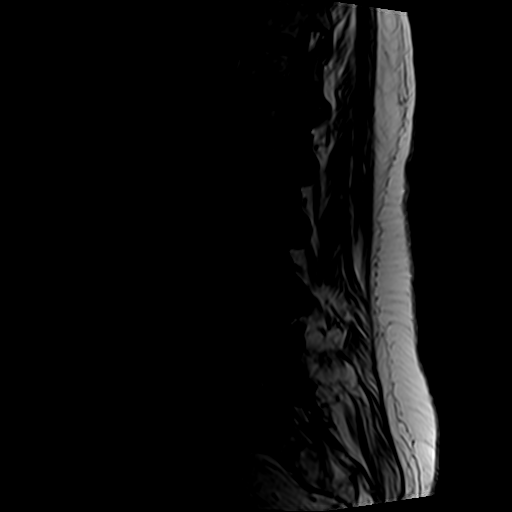
[im 13/16]
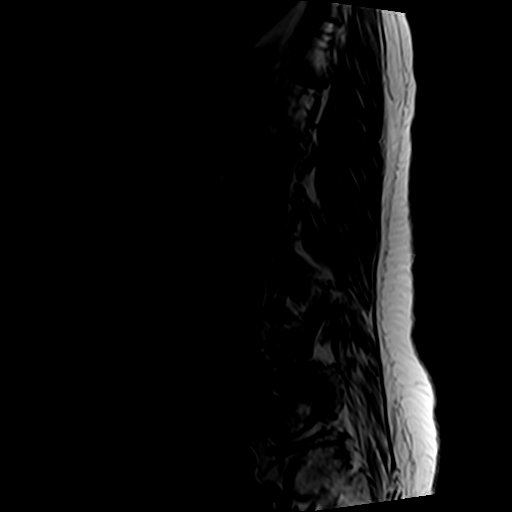
[im 16/16]
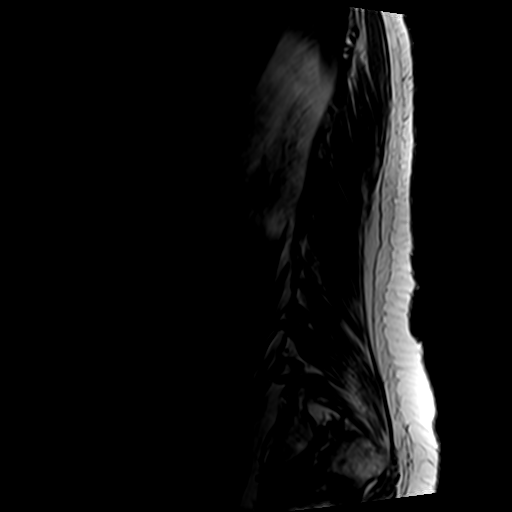

[Series 5: T2 · axial · 4.0mm · 0.70mm/px · z∈[-116,+100]mm · 9 of 38 slices shown (2 of 2)]
[im 1/38]
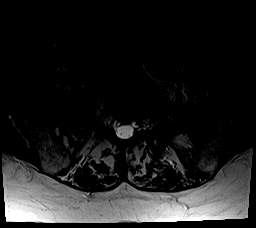
[im 6/38]
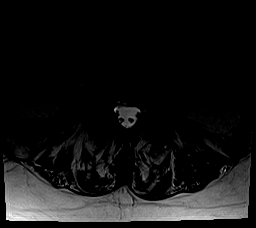
[im 11/38]
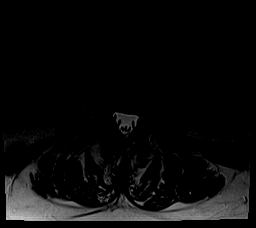
[im 16/38]
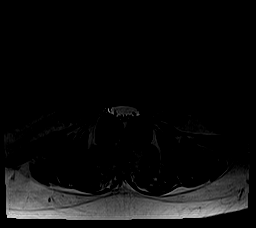
[im 19/38]
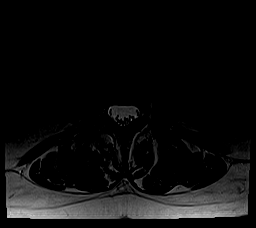
[im 22/38]
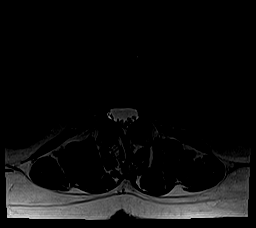
[im 27/38]
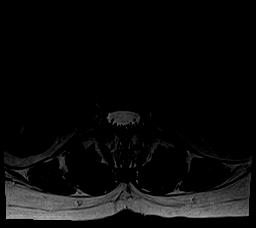
[im 32/38]
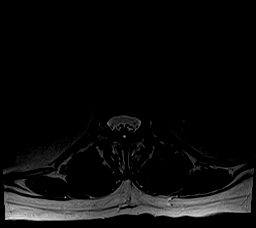
[im 38/38]
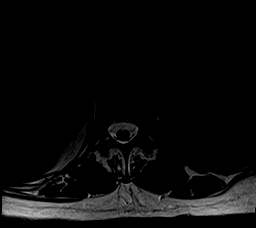

[Series 6: T1 · axial · 4.0mm · 0.35mm/px · z∈[-116,+69]mm · 5 of 38 slices shown (2 of 2)]
[im 1/38]
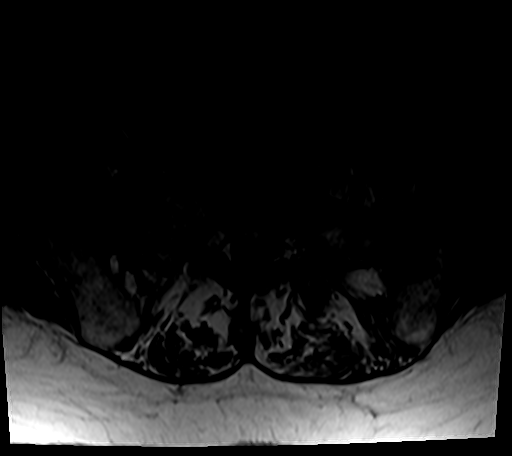
[im 6/38]
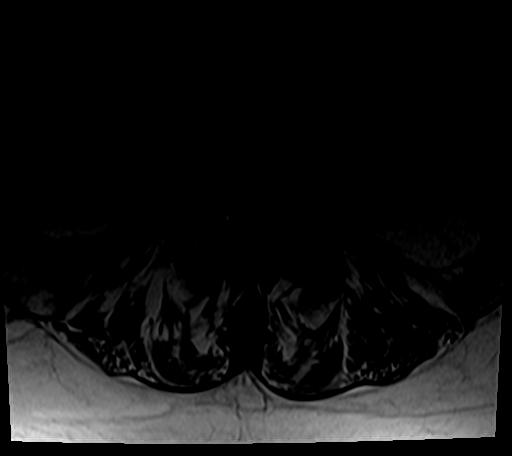
[im 11/38]
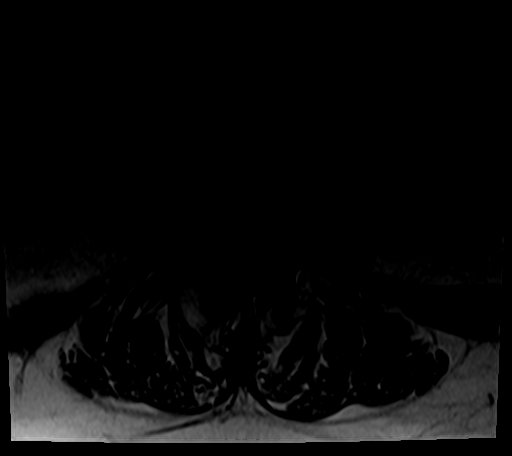
[im 19/38]
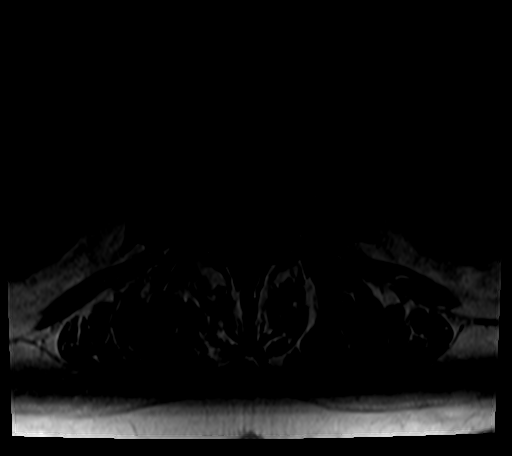
[im 32/38]
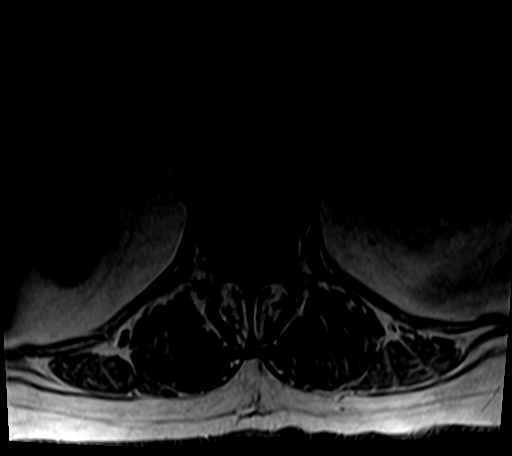

[26 of 48 positions shown; findings below may reference images not displayed]

FINDINGS: Segmentation:  Normal

Alignment:  6 mm anterolisthesis L5-S1 has progressed.

Vertebrae:  Negative for fracture or mass.

Conus medullaris and cauda equina: Conus extends to the L1-2 level.
Conus and cauda equina appear normal.

Paraspinal and other soft tissues: Negative for paraspinous mass or
adenopathy. 15 mm left renal cyst unchanged.

Disc levels:

T12-L1: Mild disc and facet degeneration.  Negative for stenosis

L1-2: Mild disc and facet degeneration.  Negative for stenosis

L2-3: Mild disc and facet degeneration.  Negative for stenosis

L3-4: Mild disc bulging. Shallow left foraminal disc protrusion
unchanged. Moderate facet degeneration bilaterally. Mild to moderate
left subarticular stenosis unchanged.

L4-5: Disc degeneration with diffuse bulging of the disc and
moderate facet degeneration bilaterally. Mild to moderate
subarticular stenosis bilaterally unchanged

L5-S1: Progressive anterolisthesis. Severe facet degeneration has
progressed. Mild spinal stenosis has progressed. Moderate
subarticular stenosis bilaterally has progressed. Moderate foraminal
stenosis bilaterally also with progression.
IMPRESSION: Multilevel degenerative change throughout the lumbar spine. There is
progression of change at L5-S1.

Progressive anterolisthesis L5-S1 with progression of severe facet
degeneration. Moderate subarticular and foraminal stenosis
bilaterally with progression.

## 2022-05-12 DIAGNOSIS — J45909 Unspecified asthma, uncomplicated: Secondary | ICD-10-CM | POA: Diagnosis not present

## 2022-05-12 DIAGNOSIS — I10 Essential (primary) hypertension: Secondary | ICD-10-CM | POA: Diagnosis not present

## 2022-05-12 DIAGNOSIS — K219 Gastro-esophageal reflux disease without esophagitis: Secondary | ICD-10-CM | POA: Diagnosis not present

## 2022-05-12 DIAGNOSIS — I4891 Unspecified atrial fibrillation: Secondary | ICD-10-CM | POA: Diagnosis not present

## 2022-05-12 DIAGNOSIS — F329 Major depressive disorder, single episode, unspecified: Secondary | ICD-10-CM | POA: Diagnosis not present

## 2022-05-12 DIAGNOSIS — E039 Hypothyroidism, unspecified: Secondary | ICD-10-CM | POA: Diagnosis not present

## 2022-05-15 DIAGNOSIS — I4891 Unspecified atrial fibrillation: Secondary | ICD-10-CM | POA: Diagnosis not present

## 2022-05-15 DIAGNOSIS — N390 Urinary tract infection, site not specified: Secondary | ICD-10-CM | POA: Diagnosis not present

## 2022-05-15 DIAGNOSIS — R350 Frequency of micturition: Secondary | ICD-10-CM | POA: Diagnosis not present

## 2022-05-15 DIAGNOSIS — R7303 Prediabetes: Secondary | ICD-10-CM | POA: Diagnosis not present

## 2022-05-15 DIAGNOSIS — I10 Essential (primary) hypertension: Secondary | ICD-10-CM | POA: Diagnosis not present

## 2022-05-15 DIAGNOSIS — J45909 Unspecified asthma, uncomplicated: Secondary | ICD-10-CM | POA: Diagnosis not present

## 2022-05-15 DIAGNOSIS — E7800 Pure hypercholesterolemia, unspecified: Secondary | ICD-10-CM | POA: Diagnosis not present

## 2022-05-15 DIAGNOSIS — E039 Hypothyroidism, unspecified: Secondary | ICD-10-CM | POA: Diagnosis not present

## 2022-05-15 DIAGNOSIS — G4700 Insomnia, unspecified: Secondary | ICD-10-CM | POA: Diagnosis not present

## 2022-05-15 DIAGNOSIS — Z0000 Encounter for general adult medical examination without abnormal findings: Secondary | ICD-10-CM | POA: Diagnosis not present

## 2022-05-15 DIAGNOSIS — Z1331 Encounter for screening for depression: Secondary | ICD-10-CM | POA: Diagnosis not present

## 2022-05-15 DIAGNOSIS — K219 Gastro-esophageal reflux disease without esophagitis: Secondary | ICD-10-CM | POA: Diagnosis not present

## 2022-06-04 DIAGNOSIS — N3020 Other chronic cystitis without hematuria: Secondary | ICD-10-CM | POA: Diagnosis not present

## 2022-07-08 DIAGNOSIS — M5416 Radiculopathy, lumbar region: Secondary | ICD-10-CM | POA: Diagnosis not present

## 2022-07-22 NOTE — Progress Notes (Unsigned)
Cardiology Office Note:    Date:  07/24/2022   ID:  Tabitha Murphy, DOB August 05, 1941, MRN [id number]  PCP:  [name], MD   [place] Providers Cardiologist:  Fransico Him, MD     Referring MD: [name], MD   CC: DOE, palpitations, and CP  History of Present Illness:    Tabitha Murphy is a 81 y.o. female with a hx of the following:  HTN Persistent A-fib Mild aortic atherosclerosis, CAD DCM HLD Prediabetes OSA on CPAP  Cardiac Catheterization in 2012 revealed mild aortic atherosclerosis and coronary artery disease with 30% Cx stenosis and revealed normal LVEF. Presented to ED in March 2022 with CC of chest discomfort, was in A-fib, HR controlled. Did have a 14 beat of run of wide complex tachycardia r/t hypokalemia, K+ was 2.9. TSH was low at 0.16. Trops normal, r/o MI. Echo showed mild LV dysfunction, EF 45-50% with basal segment hypokinesis. NM stress test did not show ischemia. Started on Eliquis 5 mg BID for CHA2DS2-VASc score of 5, with plans for DCCV after appropriate 3 weeks of anticoagulation therapy.   Was last seen by Nicholes Rough, PA-C on 10/2021 for follow up evaluation and only reported short episodes of chest pain (lasting seconds) that resolved spontaneously, not requiring NG. Pt noticed some lower extremity edema but was euvolemic on exam. Was still active at the pool. Was overall doing well from a cardiac perspective. Saw Clint Fenton, PA-C at the A-fib clinic on 01/2022 and she reported daily symptoms of palpitations, captured fast HR's on her [place]. Denied any other associated symptoms. Overall was doing well. Zio monitor arranged for her and revealed predominant SR, 2.5% supraventricular ectopy, 3.6% ventricular ectopy, no A-fib noted, triggered episodes associated w/ SR.   Today she presents for 6 month follow up appoinment for A-fib, CAD, HLD, DCM, and OSA. She states still doing the same from last office visit with Johann Capers in November 2022.   Intermittently gets her palpitations, and says she occasionally feels lightheaded and dizzy, has a sit down with these episodes, associated symptom includes mild chest pain, goes away after rest, and only lasts a few seconds, difficult for her to describe.  Shortness of breath with exertion that is chronic for her, remains the same from last visit.  Says she only takes Lasix rarely for dependent bilateral lower extremity edema.  Denies any orthopnea/PND, syncope, presyncope, hematuria, melena, hemoptysis, weakness, or fatigue.  Denies any other questions or concerns today.   Past Medical History:  Diagnosis Date   Benign essential HTN    CAD (coronary artery disease), native coronary artery    Cath 2012 with 30% LCx   DCM (dilated cardiomyopathy) ([place])    ? tachy mediated from afib with RVR.  EF 45-50% by echo 01/2021.  Lexiscan myoview with no ischemia   Diverticulosis    Hiatal hernia    HLD (hyperlipidemia)    Persistent atrial fibrillation (HCC)    On DOAC   S/P laparoscopic cholecystectomy 07/28/2011   23 years ago   Thyroid goiter     Past Surgical History:  Procedure Laterality Date   child birth     x3   CHOLECYSTECTOMY      Current Medications: Current Meds  Medication Sig   albuterol (VENTOLIN HFA) 108 (90 Base) MCG/ACT inhaler Inhale 2 puffs into the lungs every 4 (four) hours as needed for wheezing or shortness of breath.   ALPRAZolam (XANAX) 0.5 MG tablet Take 0.5 mg  by mouth daily as needed for anxiety.   Apoaequorin (PREVAGEN) 10 MG CAPS Take 1 capsule by mouth daily.   atenolol (TENORMIN) 100 MG tablet Take 100 mg by mouth daily.   b complex vitamins tablet Take 1 tablet by mouth daily.   Biotin 10000 MCG TBDP Take 1 tablet by mouth daily.   calcium carbonate (OS-CAL) 600 MG TABS Take 600 mg by mouth 2 (two) times daily with a meal.   cyclobenzaprine (FLEXERIL) 10 MG tablet Take 5 mg by mouth 3 (three) times daily as needed for muscle spasms.   DILT-XR 120 MG 24 hr  capsule Take 120 mg by mouth every morning.   ELIQUIS 5 MG TABS tablet TAKE 1 TABLET BY MOUTH TWICE A DAY   furosemide (LASIX) 20 MG tablet Take 20 mg by mouth as needed.   hydrochlorothiazide (HYDRODIURIL) 12.5 MG tablet Take 12.5 mg by mouth daily.   HYDROcodone-acetaminophen (NORCO/VICODIN) 5-325 MG tablet Take 1 tablet by mouth every 6 (six) hours as needed for moderate pain.   irbesartan (AVAPRO) 300 MG tablet Take 300 mg by mouth every morning.   levothyroxine (SYNTHROID) 25 MCG tablet Take 25 mcg by mouth every morning.   Multiple Vitamin (MULTIVITAMIN) tablet Take 1 tablet by mouth daily.   omeprazole (PRILOSEC OTC) 20 MG tablet Take 20 mg by mouth daily.   promethazine (PHENERGAN) 25 MG tablet Take 25 mg by mouth every 6 (six) hours as needed for nausea or vomiting.   rosuvastatin (CRESTOR) 5 MG tablet Take 1 tablet (5 mg total) by mouth daily.   Selenium 100 MCG TABS Take 1 tablet by mouth daily.   valACYclovir (VALTREX) 1000 MG tablet Take 1,000 mg by mouth daily as needed (fever blisters).   vitamin C (ASCORBIC ACID) 500 MG tablet Take 500 mg by mouth daily.     Allergies:   Amlodipine besylate, Codeine, [place] tartar control, Doxazosin mesylate, Duloxetine, Duloxetine hcl, Gabapentin, Lyrica [pregabalin], Rosuvastatin calcium, and Zolpidem tartrate   Social History   Socioeconomic History   Marital status: Widowed    Spouse name: Not on file   Number of children: Not on file   Years of education: Not on file   Highest education level: Not on file  Occupational History   Occupation: retired  Tobacco Use   Smoking status: Never   Smokeless tobacco: Never   Tobacco comments:    Never smoke 02/18/22  Substance and Sexual Activity   Alcohol use: No   Drug use: No    Comment: CBD gummies   Sexual activity: Not on file  Other Topics Concern   Not on file  Social History Narrative   Not on file   Social Determinants of Health   Financial Resource Strain: Not on file   Food Insecurity: Not on file  Transportation Needs: Not on file  Physical Activity: Not on file  Stress: Not on file  Social Connections: Not on file     Family History: The patient's family history includes CAD in her sister; Cervical cancer in her mother; Heart attack in her father; Heart disease in her brother. There is no history of Colon cancer.  ROS:   Review of Systems  Constitutional: Negative.   HENT: Negative.    Eyes: Negative.   Respiratory:  Positive for shortness of breath. Negative for cough, hemoptysis, sputum production and wheezing.        See HPI.  Cardiovascular:  Positive for chest pain, palpitations and leg swelling. Negative for orthopnea,  claudication and PND.       See HPI.  Gastrointestinal: Negative.   Genitourinary: Negative.   Musculoskeletal:  Positive for joint pain. Negative for back pain, falls, myalgias and neck pain.       Chronic per her report.  Skin: Negative.   Neurological:  Positive for dizziness. Negative for tingling, tremors, sensory change, speech change, focal weakness, seizures, loss of consciousness, weakness and headaches.       See HPI.  Endo/Heme/Allergies: Negative.   Psychiatric/Behavioral: Negative.      Please see the history of present illness.    All other systems reviewed and are negative.  EKGs/Labs/Other Studies Reviewed:    The following studies were reviewed today:   EKG:  EKG is ordered today.  The ekg ordered today demonstrates sinus arrhythmia, 63 bpm, otherwise normal EKG.  Zio monitor on February 11, 2022: Predominant underlying rhythm, sinus rhythm.  2.5% supraventricular ectopy.  3.6% ventricular ectopy.  Triggered episodes associated with sinus rhythm, no A-fib noted.  Nuclear medicine stress test on February 16, 2021: 1. No reversible ischemia. Small region of scarring in the anterior apex. Reduced inferior wall activity on stress and rest images favors diaphragmatic attenuation.   2. Normal left  ventricular wall motion.   3. Left ventricular ejection fraction 61%   4. Non invasive risk stratification*: Low   2D echocardiogram on February 15, 2021: Left ventricular ejection fraction, by estimation, is 45 to 50%. The  left ventricle has mildly decreased function. Left ventricular endocardial  border not optimally defined to evaluate regional wall motion. Basal  segments are hypokinetic with best  preseved function in mid and apex. Left ventricular diastolic parameters  are indeterminate.   2. Right ventricular systolic function is mildly reduced. The right  ventricular size is normal. Tricuspid regurgitation signal is inadequate  for assessing PA pressure.   3. Left atrial size was mildly dilated.   4. Right atrial size was mildly dilated.   5. The mitral valve is normal in structure. Trivial mitral valve  regurgitation. No evidence of mitral stenosis.   6. The aortic valve is grossly normal. There is mild calcification of the  aortic valve. Aortic valve regurgitation is not visualized. No aortic  stenosis is present.   7. The inferior vena cava is normal in size with greater than 50%  respiratory variability, suggesting right atrial pressure of 3 mmHg.  Recent Labs: 07/23/2022: BUN 11; Creatinine, Ser 0.87; Hemoglobin 13.0; Magnesium 1.9; Platelets 328; Potassium 4.5; Sodium 135  Recent Lipid Panel    Component Value Date/Time   CHOL 138 07/03/2021 0902   TRIG 184 (H) 07/03/2021 0902   HDL 42 07/03/2021 0902   CHOLHDL 3.3 07/03/2021 0902   CHOLHDL 3.8 02/16/2021 0404   VLDL 22 02/16/2021 0404   LDLCALC 65 07/03/2021 0902     Risk Assessment/Calculations:    CHA2DS2-VASc Score = 5  This indicates a 7.2% annual risk of stroke. The patient's score is based upon: CHF History: 0 HTN History: 1 Diabetes History: 0 Stroke History: 0 Vascular Disease History: 1 Age Score: 2 Gender Score: 1     Physical Exam:    VS:  BP (!) 128/90   Pulse (!) 50   Ht '5\' 2"'$  (1.575  m)   Wt 165 lb (74.8 kg)   BMI 30.18 kg/m     Wt Readings from Last 3 Encounters:  07/23/22 165 lb (74.8 kg)  02/18/22 169 lb 12.8 oz (77 kg)  01/31/22 167 lb (  75.8 kg)     GEN: Well nourished, well developed in no acute distress HEENT: Normal NECK: No JVD; No carotid bruits CARDIAC: S1/S2, normal rate and irregular rhythm, no murmurs, rubs, gallops; 2+ peripheral pulses throughout, strong and equal bilaterally RESPIRATORY:  Clear to auscultation without rales, wheezing or rhonchi  ABDOMEN: Soft, non-tender, non-distended MUSCULOSKELETAL: Trace, minimal nonpitting bilateral dependent lower extremity edema, otherwise normal; No deformity  SKIN: Warm and dry NEUROLOGIC:  Alert and oriented x 3 PSYCHIATRIC:  Normal affect   ASSESSMENT:    1. Atypical chest pain   2. Palpitations   3. Atrial fibrillation, unspecified type ([place])   4. DOE (dyspnea on exertion)   5. Hypertension, unspecified type   6. Hyperlipidemia, unspecified hyperlipidemia type    PLAN:    In order of problems listed above:  Atypical chest pain associated with palpitations, history of A-fib  - intermittent, chronic Atypical presentation that does not sound anginal or ischemic in nature, is associated with her palpitations, and says this is related to her A-fib.  No ischemic evaluation required at this time.  These symptoms have remained the same since last November.  States that this is not bothersome for her.  Zio monitor worn in 01/2022 revealed predominant SR, no A-fib noted. EKG today SA, 63 bpm, otherwise normal. Currently declines wearing a 14-day ZIO monitor. Told her if her symptoms worsen to let us know, we can consider this again in the future.  We will obtain the following blood work today: Magnesium, BMET, and CBC.  Continue Eliquis 5 mg twice daily - does not require a reduced dose of Eliquis. Continue atenolol 100 mg daily.  Discussed ED precautions and she verbalizes understanding.  2.  Dyspnea on  exertion-chronic, not progressing This has been chronic and stable for her since last office visit in November.  2D echogram from 01/2021 revealed LVEF 45 to 50%, hypokinesis of basal segments with best preserved function in mid and apex, mildly reduced right ventricular systolic function, mild dilation of left and right atrium, trivial mitral valve regurgitation with no mitral stenosis, mild calcification of aortic valve with no stenosis, all other findings are normal. Euvolemic and well compensated on exam.  Update 2D echo. Low sodium diet, fluid restriction <2L, and daily weights encouraged. Educated to contact our office for weight gain of 2 lbs overnight or 5 lbs in one week.  3.  Hypertension-chronic, stable Blood pressure today on exam 120/90.  She denies any abnormal BP readings at home.  Continue current medication regimen.  Obtain BMET today.  4.  Hyperlipidemia-chronic, stable LDL in August 2022 was at goal at 43.  She is due for fasting lipid panel and liver enzymes to be rechecked.  We will arrange this for her within the next 1 to 2 weeks.   5.  Disposition: Follow-up in 3 to 4 months with Dr. Radford Pax or sooner if anything changes.   Medication Adjustments/Labs and Tests Ordered: Current medicines are reviewed at length with the patient today.  Concerns regarding medicines are outlined above.  Orders Placed This Encounter  Procedures   CBC with Differential/Platelet   Magnesium   Basic metabolic panel   Lipid panel   Hepatic function panel   EKG 12-Lead   ECHOCARDIOGRAM COMPLETE   No orders of the defined types were placed in this encounter.   Patient Instructions  Medication Instructions:  Your physician recommends that you continue on your current medications as directed. Please refer to the Current Medication list  given to you today.   *If you need a refill on your cardiac medications before your next appointment, please call your pharmacy*  Lab  Work: CBC/MAGNESIUM/BMET TODAY   FASTING LP/HFP IN 1-2 WEEKS  If you have labs (blood work) drawn today and your tests are completely normal, you will receive your results only by: [place] (if you have MyChart) OR A paper copy in the mail If you have any lab test that is abnormal or we need to change your treatment, we will call you to review the results.  Testing/Procedures: Your physician has requested that you have an echocardiogram. Echocardiography is a painless test that uses sound waves to create images of your heart. It provides your doctor with information about the size and shape of your heart and how well your heart's chambers and valves are working. This procedure takes approximately one hour. There are no restrictions for this procedure.  Follow-Up: At [facility], you and your health needs are our priority.  As part of our continuing mission to provide you with exceptional heart care, we have created designated Provider Care Teams.  These Care Teams include your primary Cardiologist (physician) and Advanced Practice Providers (APPs -  Physician Assistants and Nurse Practitioners) who all work together to provide you with the care you need, when you need it.  We recommend signing up for the patient portal called "MyChart".  Sign up information is provided on this After Visit Summary.  MyChart is used to connect with patients for Virtual Visits (Telemedicine).  Patients are able to view lab/test results, encounter notes, upcoming appointments, etc.  Non-urgent messages can be sent to your provider as well.   To learn more about what you can do with MyChart, go to [email or web address].    Your next appointment:   3-4 month(s)  The format for your next appointment:   In Person  Provider:   Fransico Him, MD {       Signed, Finis Bud, NP  07/24/2022 7:24 AM    [place]

## 2022-07-23 ENCOUNTER — Ambulatory Visit (HOSPITAL_BASED_OUTPATIENT_CLINIC_OR_DEPARTMENT_OTHER): Payer: HMO | Admitting: Nurse Practitioner

## 2022-07-23 ENCOUNTER — Encounter (HOSPITAL_BASED_OUTPATIENT_CLINIC_OR_DEPARTMENT_OTHER): Payer: Self-pay | Admitting: Nurse Practitioner

## 2022-07-23 VITALS — BP 128/90 | HR 50 | Ht 62.0 in | Wt 165.0 lb

## 2022-07-23 DIAGNOSIS — R002 Palpitations: Secondary | ICD-10-CM | POA: Diagnosis not present

## 2022-07-23 DIAGNOSIS — R0609 Other forms of dyspnea: Secondary | ICD-10-CM

## 2022-07-23 DIAGNOSIS — I10 Essential (primary) hypertension: Secondary | ICD-10-CM | POA: Diagnosis not present

## 2022-07-23 DIAGNOSIS — I4891 Unspecified atrial fibrillation: Secondary | ICD-10-CM

## 2022-07-23 DIAGNOSIS — E785 Hyperlipidemia, unspecified: Secondary | ICD-10-CM

## 2022-07-23 DIAGNOSIS — Z1231 Encounter for screening mammogram for malignant neoplasm of breast: Secondary | ICD-10-CM | POA: Diagnosis not present

## 2022-07-23 DIAGNOSIS — R0789 Other chest pain: Secondary | ICD-10-CM

## 2022-07-23 NOTE — Patient Instructions (Signed)
Medication Instructions:  Your physician recommends that you continue on your current medications as directed. Please refer to the Current Medication list given to you today.   *If you need a refill on your cardiac medications before your next appointment, please call your pharmacy*  Lab Work: CBC/MAGNESIUM/BMET TODAY   FASTING LP/HFP IN 1-2 WEEKS  If you have labs (blood work) drawn today and your tests are completely normal, you will receive your results only by: [place] (if you have MyChart) OR A paper copy in the mail If you have any lab test that is abnormal or we need to change your treatment, we will call you to review the results.  Testing/Procedures: Your physician has requested that you have an echocardiogram. Echocardiography is a painless test that uses sound waves to create images of your heart. It provides your doctor with information about the size and shape of your heart and how well your heart's chambers and valves are working. This procedure takes approximately one hour. There are no restrictions for this procedure.  Follow-Up: At [facility], you and your health needs are our priority.  As part of our continuing mission to provide you with exceptional heart care, we have created designated Provider Care Teams.  These Care Teams include your primary Cardiologist (physician) and Advanced Practice Providers (APPs -  Physician Assistants and Nurse Practitioners) who all work together to provide you with the care you need, when you need it.  We recommend signing up for the patient portal called "MyChart".  Sign up information is provided on this After Visit Summary.  MyChart is used to connect with patients for Virtual Visits (Telemedicine).  Patients are able to view lab/test results, encounter notes, upcoming appointments, etc.  Non-urgent messages can be sent to your provider as well.   To learn more about what you can do with MyChart, go to [email or web address].     Your next appointment:   3-4 month(s)  The format for your next appointment:   In Person  Provider:   Fransico Him, MD {

## 2022-07-24 ENCOUNTER — Telehealth (HOSPITAL_BASED_OUTPATIENT_CLINIC_OR_DEPARTMENT_OTHER): Payer: Self-pay

## 2022-07-24 LAB — CBC WITH DIFFERENTIAL/PLATELET
Basophils Absolute: 0.1 10*3/uL (ref 0.0–0.2)
Basos: 1 %
EOS (ABSOLUTE): 0.2 10*3/uL (ref 0.0–0.4)
Eos: 1 %
Hematocrit: 37.7 % (ref 34.0–46.6)
Hemoglobin: 13 g/dL (ref 11.1–15.9)
Immature Grans (Abs): 0 10*3/uL (ref 0.0–0.1)
Immature Granulocytes: 0 %
Lymphocytes Absolute: 2.5 10*3/uL (ref 0.7–3.1)
Lymphs: 22 %
MCH: 32.3 pg (ref 26.6–33.0)
MCHC: 34.5 g/dL (ref 31.5–35.7)
MCV: 94 fL (ref 79–97)
Monocytes Absolute: 1 10*3/uL — ABNORMAL HIGH (ref 0.1–0.9)
Monocytes: 9 %
Neutrophils Absolute: 7.7 10*3/uL — ABNORMAL HIGH (ref 1.4–7.0)
Neutrophils: 67 %
Platelets: 328 10*3/uL (ref 150–450)
RBC: 4.02 x10E6/uL (ref 3.77–5.28)
RDW: 12.9 % (ref 11.7–15.4)
WBC: 11.5 10*3/uL — ABNORMAL HIGH (ref 3.4–10.8)

## 2022-07-24 LAB — BASIC METABOLIC PANEL
BUN/Creatinine Ratio: 13 (ref 12–28)
BUN: 11 mg/dL (ref 8–27)
CO2: 24 mmol/L (ref 20–29)
Calcium: 9.7 mg/dL (ref 8.7–10.3)
Chloride: 93 mmol/L — ABNORMAL LOW (ref 96–106)
Creatinine, Ser: 0.87 mg/dL (ref 0.57–1.00)
Glucose: 114 mg/dL — ABNORMAL HIGH (ref 70–99)
Potassium: 4.5 mmol/L (ref 3.5–5.2)
Sodium: 135 mmol/L (ref 134–144)
eGFR: 67 mL/min/{1.73_m2} (ref >59)

## 2022-07-24 LAB — MAGNESIUM: Magnesium: 1.9 mg/dL (ref 1.6–2.3)

## 2022-07-24 NOTE — Telephone Encounter (Addendum)
Results called to patient who verbalizes understanding!    ----- Message from Loel Dubonnet, NP sent at 07/24/2022  8:02 AM EDT ----- Normal kidney function, electrolytes.  No evidence of anemia.  White blood cell count mildly elevated.  Recommend she contact her primary care provider if she is having any signs or symptoms of infection (cough, fever, chills, dysuria).

## 2022-08-13 ENCOUNTER — Ambulatory Visit (INDEPENDENT_AMBULATORY_CARE_PROVIDER_SITE_OTHER): Payer: HMO

## 2022-08-13 DIAGNOSIS — R002 Palpitations: Secondary | ICD-10-CM | POA: Diagnosis not present

## 2022-08-13 DIAGNOSIS — R0609 Other forms of dyspnea: Secondary | ICD-10-CM | POA: Diagnosis not present

## 2022-08-13 LAB — ECHOCARDIOGRAM COMPLETE
AR max vel: 1.35 cm2
AV Area VTI: 1.35 cm2
AV Area mean vel: 1.32 cm2
AV Mean grad: 6 mmHg
AV Peak grad: 10.6 mmHg
Ao pk vel: 1.63 m/s
Area-P 1/2: 3.77 cm2
MV M vel: 2.71 m/s
MV Peak grad: 29.4 mmHg
S' Lateral: 3.37 cm

## 2022-08-14 ENCOUNTER — Telehealth (HOSPITAL_BASED_OUTPATIENT_CLINIC_OR_DEPARTMENT_OTHER): Payer: Self-pay

## 2022-08-14 DIAGNOSIS — I10 Essential (primary) hypertension: Secondary | ICD-10-CM

## 2022-08-14 DIAGNOSIS — R0609 Other forms of dyspnea: Secondary | ICD-10-CM

## 2022-08-14 MED ORDER — FUROSEMIDE 20 MG PO TABS: 20.0000 mg | ORAL_TABLET | Freq: Every day | ORAL | 3 refills | Status: DC

## 2022-08-14 NOTE — Telephone Encounter (Addendum)
Called results to patient and left results on VM (ok per DPR), instructions left to call office back if patient has any questions! Labs ordered and mailed, prescriptions updated and sent to pharmacy on file .    ----- Message from Loel Dubonnet, NP sent at 08/13/2022  2:27 PM EDT ----- Echocardiogram with normal heart pumping function.  Heart muscle moderately stiff.  Right ventricle mildly enlarged.  No significant heart valve abnormalities.  Stiffness of the heart muscle can contribute to shortness of breath.  Would recommend stop hydrochlorothiazide 12.5 mg daily instead take Lasix 20 mg daily.  BMP in 1 week for monitoring.

## 2022-08-25 ENCOUNTER — Telehealth: Payer: Self-pay | Admitting: Family

## 2022-08-25 NOTE — Telephone Encounter (Signed)
Called and reviewed results. Agreeable to stop HCTZ daily and start Lasix '20mg'$  daily. BMP In 1 week at [facility] lab location. She verbalized understanding.  Loel Dubonnet, NP

## 2022-08-25 NOTE — Telephone Encounter (Signed)
Pt calling back, please call mobile number [phone]

## 2022-08-25 NOTE — Telephone Encounter (Signed)
Pt would like someone to call her to explain her echo results and med changes.

## 2022-08-25 NOTE — Telephone Encounter (Signed)
Left message for patient to call back    "Echocardiogram with normal heart pumping function.  Heart muscle moderately stiff.  Right ventricle mildly enlarged.  No significant heart valve abnormalities.   Stiffness of the heart muscle can contribute to shortness of breath.  Would recommend stop hydrochlorothiazide 12.5 mg daily instead take Lasix 20 mg daily.  BMP in 1 week for monitoring."

## 2022-08-25 NOTE — Telephone Encounter (Signed)
Attempted to call patient to review the following information, no answer, unable to leave message.     "Echocardiogram with normal heart pumping function.  Heart muscle moderately stiff.  Right ventricle mildly enlarged.  No significant heart valve abnormalities.   Stiffness of the heart muscle can contribute to shortness of breath.  Would recommend stop hydrochlorothiazide 12.5 mg daily instead take Lasix 20 mg daily.  BMP in 1 week for monitoring."

## 2022-08-26 DIAGNOSIS — S60511A Abrasion of right hand, initial encounter: Secondary | ICD-10-CM | POA: Diagnosis not present

## 2022-08-26 DIAGNOSIS — R112 Nausea with vomiting, unspecified: Secondary | ICD-10-CM | POA: Diagnosis not present

## 2022-08-26 DIAGNOSIS — G4733 Obstructive sleep apnea (adult) (pediatric): Secondary | ICD-10-CM | POA: Diagnosis not present

## 2022-08-26 DIAGNOSIS — R42 Dizziness and giddiness: Secondary | ICD-10-CM | POA: Diagnosis not present

## 2022-08-26 DIAGNOSIS — S4991XA Unspecified injury of right shoulder and upper arm, initial encounter: Secondary | ICD-10-CM | POA: Diagnosis not present

## 2022-08-26 DIAGNOSIS — S3992XA Unspecified injury of lower back, initial encounter: Secondary | ICD-10-CM | POA: Diagnosis not present

## 2022-08-26 DIAGNOSIS — S0083XA Contusion of other part of head, initial encounter: Secondary | ICD-10-CM | POA: Diagnosis not present

## 2022-08-26 DIAGNOSIS — H8112 Benign paroxysmal vertigo, left ear: Secondary | ICD-10-CM | POA: Diagnosis not present

## 2022-08-26 DIAGNOSIS — W19XXXA Unspecified fall, initial encounter: Secondary | ICD-10-CM | POA: Diagnosis not present

## 2022-09-03 ENCOUNTER — Other Ambulatory Visit: Payer: Self-pay | Admitting: Cardiology

## 2022-09-03 ENCOUNTER — Other Ambulatory Visit: Payer: HMO

## 2022-09-03 ENCOUNTER — Telehealth (HOSPITAL_BASED_OUTPATIENT_CLINIC_OR_DEPARTMENT_OTHER): Payer: Self-pay

## 2022-09-03 DIAGNOSIS — I10 Essential (primary) hypertension: Secondary | ICD-10-CM | POA: Diagnosis not present

## 2022-09-03 DIAGNOSIS — R0609 Other forms of dyspnea: Secondary | ICD-10-CM | POA: Diagnosis not present

## 2022-09-03 NOTE — Telephone Encounter (Signed)
She saw Finis Bud, NP a few weeks ago in clinic. If she falls and lands on her head she needs to be evaluated in the emergency department. Recommend slow position changes to help prevent falls. She was recently transitioned from HCTZ to Lasix which will help with swelling. Recommend elevation of lower extremities, compression stockings to help with lower extremity swelling.   Loel Dubonnet, NP

## 2022-09-03 NOTE — Telephone Encounter (Signed)
Prescription refill request for Eliquis received. Indication: AF Last office visit: 07/23/22  Annita Brod NP Scr: 0.87 on 07/23/22 Age: 81  Weight: 74.8kg  Based on above findings Eliquis '5mg'$  twice daily is the appropriate dose.  Refill approved.

## 2022-09-03 NOTE — Telephone Encounter (Signed)
Pt. Presented at the front desk.  Noted multiple bruises on face and arms.  Pt stated she fell at home 1.5 weeks ago.  States she is having increased swelling which caused her to fall.  Pt is taking Eliquis, and bruises have improved per her report. Pt had labwork today. Writer explained Urban Gibson was out of the office today, however she will see this message when she returns and have opportunity to review lab results.  Patient was agreeable. [place] RN CCM.

## 2022-09-04 LAB — BASIC METABOLIC PANEL
BUN/Creatinine Ratio: 13 (ref 12–28)
BUN: 11 mg/dL (ref 8–27)
CO2: 27 mmol/L (ref 20–29)
Calcium: 9.7 mg/dL (ref 8.7–10.3)
Chloride: 89 mmol/L — ABNORMAL LOW (ref 96–106)
Creatinine, Ser: 0.87 mg/dL (ref 0.57–1.00)
Glucose: 119 mg/dL — ABNORMAL HIGH (ref 70–99)
Potassium: 4.2 mmol/L (ref 3.5–5.2)
Sodium: 134 mmol/L (ref 134–144)
eGFR: 67 mL/min/{1.73_m2} (ref >59)

## 2022-09-04 NOTE — Telephone Encounter (Signed)
Call attempt, no answer, left message with the following recommendations.      "She saw Finis Bud, NP a few weeks ago in clinic. If she falls and lands on her head she needs to be evaluated in the emergency department. Recommend slow position changes to help prevent falls. She was recently transitioned from HCTZ to Lasix which will help with swelling. Recommend elevation of lower extremities, compression stockings to help with lower extremity swelling.    Loel Dubonnet, NP  "   Loel Dubonnet, NP  Gerald Stabs, RN Normal kidney function, electrolytes. Continue same medications.

## 2022-09-10 ENCOUNTER — Telehealth: Payer: Self-pay | Admitting: Nurse Practitioner

## 2022-09-10 NOTE — Telephone Encounter (Signed)
Returned call to patient.  Patient states she stopped taking furosemide '20mg'$  QD last week. She states it seemed to help swelling at first but now causes her to swell more when taking daily. She switched back to taking HCTZ 12.'5mg'$  QD this week and states it does not seem to be helping her swelling.  Patient reports she had fallen and has not been as active recently and questioned if this could be causing her swelling.  Informed patient she was to continue taking furosemide '20mg'$  QD. She reports her other doctor told her no one should be taking that medication every day. She states she tends to have low sodium and has to eat extra salt and limit fluid intake to 32 oz per day.  Patient states she will go back on furosemide '20mg'$  QD if that is what is recommended but was concerned about it causing her to swell more and the potential of bringing her sodium level down.  Will forward to Laurann Montana, NP to review and advise.

## 2022-09-10 NOTE — Telephone Encounter (Signed)
Spoke with patient and discussed Laurann Montana, NP's recommendation: If she wants to go back to HCTZ 12.'5mg'$  daily and stop Lasix '20mg'$  - she can. Needs to elevate legs, wear compression stockings, follow low sodium diet. Recent echo with no significant abnormality that would cause swelling. If she has persistent LE edema needs OV with Dr. Radford Pax or APP to evaluate.  Patient expressed concern for continuing HCTZ 12.'5mg'$  QD, concerned she may swell up again.  Patient is now requesting to take Lasix '20mg'$  as needed for swelling. She reports she does not have any edema at this time, but has responded well to taking Lasix on a PRN basis.  Will forward to Laurann Montana, NP to review and advise.

## 2022-09-10 NOTE — Telephone Encounter (Signed)
  Pt c/o medication issue:  1. Name of Medication: furosemide (LASIX) 20 MG tablet  2. How are you currently taking this medication (dosage and times per day)? Take 1 tablet (20 mg total) by mouth daily.  3. Are you having a reaction (difficulty breathing--STAT)? No   4. What is your medication issue? Pt would like to clarify if she does need to take this medication everyday

## 2022-09-10 NOTE — Telephone Encounter (Signed)
If she wants to go back to HCTZ 12.'5mg'$  daily and stop Lasix '20mg'$  - she can. Needs to elevate legs, wear compression stockings, follow low sodium diet. Recent echo with no significant abnormality that would cause swelling. If she has persistent LE edema needs OV with Dr. Radford Pax or APP to evaluate.  Loel Dubonnet, NP

## 2022-09-11 MED ORDER — HYDROCHLOROTHIAZIDE 12.5 MG PO CAPS: 12.5000 mg | ORAL_CAPSULE | Freq: Every day | ORAL | 3 refills | Status: DC

## 2022-09-11 MED ORDER — FUROSEMIDE 20 MG PO TABS
ORAL_TABLET | ORAL | 3 refills | Status: DC
Start: 2022-09-11 — End: 2024-08-25

## 2022-09-11 NOTE — Telephone Encounter (Signed)
Pt advised and verbalized understanding.

## 2022-09-11 NOTE — Telephone Encounter (Signed)
Continue HCTZ 12.'5mg'$  QD. Lasix '20mg'$  PRN for swelling. If taking more than twice per week, she needs to contact the office.   Loel Dubonnet, NP
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
## 2022-09-17 ENCOUNTER — Other Ambulatory Visit: Payer: Self-pay

## 2022-09-17 ENCOUNTER — Ambulatory Visit: Payer: HMO | Attending: Physician Assistant | Admitting: Physical Therapy

## 2022-09-17 DIAGNOSIS — R2689 Other abnormalities of gait and mobility: Secondary | ICD-10-CM | POA: Diagnosis present

## 2022-09-17 DIAGNOSIS — R42 Dizziness and giddiness: Secondary | ICD-10-CM | POA: Diagnosis present

## 2022-09-17 DIAGNOSIS — R2681 Unsteadiness on feet: Secondary | ICD-10-CM | POA: Diagnosis present

## 2022-09-17 DIAGNOSIS — M542 Cervicalgia: Secondary | ICD-10-CM | POA: Insufficient documentation

## 2022-09-22 ENCOUNTER — Ambulatory Visit: Payer: HMO | Admitting: Physical Therapy
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
## 2022-09-23 ENCOUNTER — Encounter: Payer: Self-pay | Admitting: Physical Therapy

## 2022-09-23 ENCOUNTER — Ambulatory Visit: Payer: HMO | Admitting: Physical Therapy

## 2022-09-23 DIAGNOSIS — R2689 Other abnormalities of gait and mobility: Secondary | ICD-10-CM

## 2022-09-23 DIAGNOSIS — R42 Dizziness and giddiness: Secondary | ICD-10-CM

## 2022-09-23 DIAGNOSIS — M542 Cervicalgia: Secondary | ICD-10-CM

## 2022-09-23 DIAGNOSIS — R2681 Unsteadiness on feet: Secondary | ICD-10-CM

## 2022-09-24 ENCOUNTER — Ambulatory Visit: Payer: HMO
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
## 2022-10-01 ENCOUNTER — Ambulatory Visit: Payer: HMO | Attending: Physician Assistant | Admitting: Physical Therapy

## 2022-10-01 ENCOUNTER — Encounter: Payer: Self-pay | Admitting: Physical Therapy

## 2022-10-01 ENCOUNTER — Telehealth: Payer: Self-pay | Admitting: Physical Therapy

## 2022-10-01 DIAGNOSIS — M542 Cervicalgia: Secondary | ICD-10-CM | POA: Diagnosis present

## 2022-10-01 DIAGNOSIS — R2689 Other abnormalities of gait and mobility: Secondary | ICD-10-CM | POA: Diagnosis present

## 2022-10-01 DIAGNOSIS — R42 Dizziness and giddiness: Secondary | ICD-10-CM | POA: Insufficient documentation

## 2022-10-01 DIAGNOSIS — R2681 Unsteadiness on feet: Secondary | ICD-10-CM | POA: Insufficient documentation

## 2022-10-01 NOTE — Telephone Encounter (Signed)
Hello,  I am seeing [name] in [place] s/p concussion per your referral. We have also identified that she has BPPV. The patient is not tolerating sessions well d/t nausea and anxiety. If there are any medications or services that you believe would be helpful for her to better tolerate treatment, that would be appreciated. Please advise.   Thanks!  Janene Harvey, PT, DPT 10/01/22 4:21 PM  [facility] at [facility] [address] [place], [place] [address] Phone # [phone] Fax # [phone]

## 2022-10-06 ENCOUNTER — Ambulatory Visit: Payer: HMO | Admitting: Physical Therapy
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
## 2022-10-08 ENCOUNTER — Ambulatory Visit: Payer: HMO | Admitting: Physical Therapy

## 2022-10-08 ENCOUNTER — Encounter: Payer: Self-pay | Admitting: Physical Therapy

## 2022-10-08 DIAGNOSIS — R42 Dizziness and giddiness: Secondary | ICD-10-CM | POA: Diagnosis not present

## 2022-10-08 DIAGNOSIS — M542 Cervicalgia: Secondary | ICD-10-CM

## 2022-10-08 DIAGNOSIS — R2689 Other abnormalities of gait and mobility: Secondary | ICD-10-CM

## 2022-10-08 DIAGNOSIS — R2681 Unsteadiness on feet: Secondary | ICD-10-CM

## 2022-10-10 ENCOUNTER — Encounter: Payer: Self-pay | Admitting: Physician Assistant

## 2022-10-13 ENCOUNTER — Encounter: Payer: HMO | Admitting: Physical Therapy

## 2022-10-13 ENCOUNTER — Other Ambulatory Visit: Payer: Self-pay | Admitting: Physician Assistant

## 2022-10-13 DIAGNOSIS — R42 Dizziness and giddiness: Secondary | ICD-10-CM
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
## 2022-10-15 ENCOUNTER — Encounter: Payer: Self-pay | Admitting: Physical Therapy

## 2022-10-15 ENCOUNTER — Ambulatory Visit: Payer: HMO | Admitting: Physical Therapy

## 2022-10-15 DIAGNOSIS — R42 Dizziness and giddiness: Secondary | ICD-10-CM | POA: Diagnosis not present

## 2022-10-15 DIAGNOSIS — R2689 Other abnormalities of gait and mobility: Secondary | ICD-10-CM

## 2022-10-15 DIAGNOSIS — R2681 Unsteadiness on feet: Secondary | ICD-10-CM

## 2022-10-15 DIAGNOSIS — M542 Cervicalgia: Secondary | ICD-10-CM

## 2022-10-16 ENCOUNTER — Ambulatory Visit
Admission: RE | Admit: 2022-10-16 | Discharge: 2022-10-16 | Disposition: A | Discharge status: Home / Self Care | Payer: HMO | Source: Ambulatory Visit | Attending: Physician Assistant | Admitting: Physician Assistant

## 2022-10-16 DIAGNOSIS — R42 Dizziness and giddiness: Secondary | ICD-10-CM

## 2022-10-20 ENCOUNTER — Encounter: Payer: HMO | Admitting: Physical Therapy

## 2022-10-22 ENCOUNTER — Encounter: Payer: HMO | Admitting: Physical Therapy

## 2022-10-27 ENCOUNTER — Encounter: Payer: HMO | Admitting: Physical Therapy

## 2022-10-29 ENCOUNTER — Encounter: Payer: HMO | Admitting: Physical Therapy

## 2022-11-03 ENCOUNTER — Encounter: Payer: HMO | Admitting: Physical Therapy

## 2022-11-05 ENCOUNTER — Encounter: Payer: HMO | Admitting: Physical Therapy

## 2022-11-06 ENCOUNTER — Encounter: Payer: HMO | Admitting: Physical Therapy

## 2022-11-10 ENCOUNTER — Encounter: Payer: HMO | Admitting: Physical Therapy

## 2022-11-12 ENCOUNTER — Encounter: Payer: HMO | Admitting: Physical Therapy

## 2022-11-14 ENCOUNTER — Other Ambulatory Visit: Payer: Self-pay | Admitting: Family Medicine

## 2022-11-14 DIAGNOSIS — E041 Nontoxic single thyroid nodule: Secondary | ICD-10-CM

## 2022-11-26 ENCOUNTER — Ambulatory Visit
Admission: RE | Admit: 2022-11-26 | Discharge: 2022-11-26 | Disposition: A | Discharge status: Home / Self Care | Payer: PPO | Source: Ambulatory Visit | Attending: Family Medicine | Admitting: Family Medicine

## 2022-11-26 DIAGNOSIS — E041 Nontoxic single thyroid nodule: Secondary | ICD-10-CM

## 2022-12-01 DIAGNOSIS — M545 Low back pain, unspecified: Secondary | ICD-10-CM | POA: Diagnosis not present

## 2022-12-01 DIAGNOSIS — G4733 Obstructive sleep apnea (adult) (pediatric): Secondary | ICD-10-CM | POA: Diagnosis not present

## 2022-12-02 NOTE — Progress Notes (Signed)
Cardiology Office Note:    Date:  12/05/2022   ID:  AAVA DELAND, DOB 14-Apr-1941, MRN [id number]  PCP:  [name], MD  Cardiologist:  Fransico Him, MD    Referring MD: [name], MD   Chief Complaint  Patient presents with   Atrial Fibrillation   Coronary Artery Disease   Hypertension   Hyperlipidemia    History of Present Illness:    Tabitha Murphy is a 82 y.o. female with a hx of mild ASCAD with 30% mLCx by cath in 2012 with normal LVF, HTN, HH, pre DM who presented to the ER in March 2022 with complaints of chest pain.  She had had several episodes of chest pain usually lasting 20-30 min associated with diaphoresis and then spontaneously resolve.   The day PTA she was at the [facility] and had just finished swimming.  She got out of the pool and developed chest pressure and broke out in a profuse sweat.  She denied any SOB, nausea or radiation of the discomfort. She met a friend at the [facility] to go to lunch and told her that she did not feel well and the staff at the [facility] called EMS.    On arrival with as in new rate controlled afib with HR in the 60-80's.  She had a 14 beat run of WCT but this was in the setting of hypokalemia (K+ 2.9). TSH was low at 0.16. She ruled out for MI with normal hsTrop.  2D echo showed mild LV dysfunction with EF 45-50% with HK of the basal segments and myoview showed no ischemia.   She was started on apixaban 38m BID for CHADS2VASC score of 5.  Plan was for outpt followup and DCCV after 3 weeks of anticoagulation if still in afib but spontaneously converted to NSR. She is followed in afib clinic. She has had some issues with palpitations and wore a Ziopatch which showed no afib and no sustained arrhythmias.   She is here today for followup and is doing well.  She is having episode chest heaviness at times and gets SOB and scared.  She denies any  DOE, PND, orthopnea, LE edema, dizziness or syncope. She is still having palpitations at time usually once weekly  and last less than 24 hours.  She is compliant with his meds and is tolerating meds with no SE.    Past Medical History:  Diagnosis Date   Benign essential HTN    CAD (coronary artery disease), native coronary artery    Cath 2012 with 30% LCx   DCM (dilated cardiomyopathy) ([place]    ? tachy mediated from afib with RVR.  EF 45-50% by echo 01/2021.  Lexiscan myoview with no ischemia   Diverticulosis    Hiatal hernia    HLD (hyperlipidemia)    Persistent atrial fibrillation (HCC)    On DOAC   S/P laparoscopic cholecystectomy 07/28/2011   23 years ago   Thyroid goiter     Past Surgical History:  Procedure Laterality Date   child birth     x3   CHOLECYSTECTOMY      Current Medications: Current Meds  Medication Sig   albuterol (VENTOLIN HFA) 108 (90 Base) MCG/ACT inhaler Inhale 2 puffs into the lungs every 4 (four) hours as needed for wheezing or shortness of breath.   ALPRAZolam (XANAX) 0.5 MG tablet Take 0.5 mg by mouth daily as needed for anxiety.   Apoaequorin (PREVAGEN) 10 MG CAPS Take 1 capsule by mouth daily.  atenolol (TENORMIN) 100 MG tablet Take 100 mg by mouth daily.   b complex vitamins tablet Take 1 tablet by mouth daily.   Biotin 10000 MCG TBDP Take 1 tablet by mouth daily.   cyclobenzaprine (FLEXERIL) 10 MG tablet Take 5 mg by mouth 3 (three) times daily as needed for muscle spasms.   DILT-XR 120 MG 24 hr capsule Take 120 mg by mouth every morning.   ELIQUIS 5 MG TABS tablet TAKE 1 TABLET BY MOUTH TWICE A DAY   furosemide (LASIX) 20 MG tablet Take one tablet (20 mg) as needed for swelling but call the office if taking more than twice a week.   hydrochlorothiazide (MICROZIDE) 12.5 MG capsule Take 1 capsule (12.5 mg total) by mouth daily.   HYDROcodone-acetaminophen (NORCO/VICODIN) 5-325 MG tablet Take 1 tablet by mouth every 6 (six) hours as needed for moderate pain.   irbesartan (AVAPRO) 300 MG tablet Take 300 mg by mouth every morning.   levothyroxine (SYNTHROID) 25  MCG tablet Take 25 mcg by mouth every morning.   Multiple Vitamin (MULTIVITAMIN) tablet Take 1 tablet by mouth daily.   omeprazole (PRILOSEC OTC) 20 MG tablet Take 20 mg by mouth daily.   promethazine (PHENERGAN) 25 MG tablet Take 25 mg by mouth every 6 (six) hours as needed for nausea or vomiting.   rosuvastatin (CRESTOR) 5 MG tablet Take 1 tablet (5 mg total) by mouth daily.   Selenium 100 MCG TABS Take 1 tablet by mouth daily.   valACYclovir (VALTREX) 1000 MG tablet Take 1,000 mg by mouth daily as needed (fever blisters).   vitamin C (ASCORBIC ACID) 500 MG tablet Take 500 mg by mouth daily.     Allergies:   Amlodipine besylate, Codeine, Crest tartar control, Doxazosin mesylate, Duloxetine, Duloxetine hcl, Gabapentin, Lyrica [pregabalin], Rosuvastatin calcium, and Zolpidem tartrate   Social History   Socioeconomic History   Marital status: Widowed    Spouse name: Not on file   Number of children: Not on file   Years of education: Not on file   Highest education level: Not on file  Occupational History   Occupation: retired  Tobacco Use   Smoking status: Never   Smokeless tobacco: Never   Tobacco comments:    Never smoke 02/18/22  Substance and Sexual Activity   Alcohol use: No   Drug use: No    Comment: CBD gummies   Sexual activity: Not on file  Other Topics Concern   Not on file  Social History Narrative   Not on file   Social Determinants of Health   Financial Resource Strain: Not on file  Food Insecurity: Not on file  Transportation Needs: Not on file  Physical Activity: Not on file  Stress: Not on file  Social Connections: Not on file     Family History: The patient's family history includes CAD in her sister; Cervical cancer in her mother; Heart attack in her father; Heart disease in her brother. There is no history of Colon cancer.  ROS:   Please see the history of present illness.    ROS  All other systems reviewed and negative.   EKGs/Labs/Other  Studies Reviewed:    The following studies were reviewed today: 2D echo, Lexiscan myoview  EKG:  EKG is not  ordered today.    Recent Labs: 07/23/2022: Hemoglobin 13.0; Magnesium 1.9; Platelets 328 09/03/2022: BUN 11; Creatinine, Ser 0.87; Potassium 4.2; Sodium 134   Recent Lipid Panel    Component Value Date/Time   CHOL  138 07/03/2021 0902   TRIG 184 (H) 07/03/2021 0902   HDL 42 07/03/2021 0902   CHOLHDL 3.3 07/03/2021 0902   CHOLHDL 3.8 02/16/2021 0404   VLDL 22 02/16/2021 0404   LDLCALC 65 07/03/2021 0902    CHA2DS2-VASc Score = 5 [CHF History: 0, HTN History: 1, Diabetes History: 0, Stroke History: 0, Vascular Disease History: 1, Age Score: 2, Gender Score: 1].  Therefore, the patient's annual risk of stroke is 7.2 %.        Physical Exam:    VS:  BP 116/72   Pulse 80   Ht 5' 1.5" (1.562 m)   Wt 160 lb (72.6 kg)   SpO2 97%   BMI 29.74 kg/m     Wt Readings from Last 3 Encounters:  12/05/22 160 lb (72.6 kg)  07/23/22 165 lb (74.8 kg)  02/18/22 169 lb 12.8 oz (77 kg)    GEN: Well nourished, well developed in no acute distress HEENT: Normal NECK: No JVD; No carotid bruits LYMPHATICS: No lymphadenopathy CARDIAC:RRR, no murmurs, rubs, gallops RESPIRATORY:  Clear to auscultation without rales, wheezing or rhonchi  ABDOMEN: Soft, non-tender, non-distended MUSCULOSKELETAL:  No edema; No deformity  SKIN: Warm and dry NEUROLOGIC:  Alert and oriented x 3 PSYCHIATRIC:  Normal affect  ASSESSMENT:    1. Persistent atrial fibrillation ([place])   2. Coronary artery disease involving native coronary artery of native heart without angina pectoris   3. Essential hypertension   4. Hyperlipidemia, unspecified hyperlipidemia type   5. DCM (dilated cardiomyopathy) ([place])    PLAN:    In order of problems listed above:  Persistent atrial fibrillation -found to be in new onset atrial fibrillation in setting of hypokalemia -now on apixaban 37m BID for CHADS2VASC score of  5. -denies any bleeding problems -she remains in NSR and has palpitations about once weekly lasting < 24 hours -she wore a ziopatch that did not show any afib in Feb 23 and afib clinic felt her palpitations were not related to any arrhythmias despite her apple watch saying she was in afib -I will get an event monitor since her episodes only occur once weekly -continue prescription drug management with Atenolol 1053mdaily, Diltiazem 12061maily and Apixaban 5mg85mD with PRN refills -I have personally reviewed and interpreted outside labs performed by patient's PCP which showed SCr 0.8, Hb 11.6 on 11/14/22  ASCAD -30% mLCx by cath in 2012 -LexiBallingerh no ischemia in March 2022 -she denies any anginal sx -no ASA due to DOAC -she is having episode chest heaviness at times and gets SOB and scared -I will get a lexiscan myoview to rule out ischemia -continue prescription drug management with Atenolol 100mg34mly, and Crestor 5mg d64my with PRn refills -Shared Decision Making/Informed Consent The risks [chest pain, shortness of breath, cardiac arrhythmias, dizziness, blood pressure fluctuations, myocardial infarction, stroke/transient ischemic attack, nausea, vomiting, allergic reaction, radiation exposure, metallic taste sensation and life-threatening complications (estimated to be 1 in 10,000)], benefits (risk stratification, diagnosing coronary artery disease, treatment guidance) and alternatives of a nuclear stress test were discussed in detail with Ms. RobinsQuentin Cornwallhe agrees to proceed.  HTN -BP controlled on exam  -continue prescription drug management with Atenolol 100mg d14m, HCTZ 12.5mg dai28m Cardizem CD 120mg dai22mnd Irbesartan 300mg dail62mth PRN refills -I have personally reviewed and interpreted outside labs performed by patient's PCP which showed SCr 0.8 and K+ 3.9 on 11/14/22  HLD -LDL goal < 70 -continue Prescription drug management with Crestor  34m daily with PRN  refills -I will get a copy of her lipids she had done this week  DCM -? Tachy mediated due to afib with RVR -lexiscan myoview with no ischemia -continue ARB and BB     Medication Adjustments/Labs and Tests Ordered: Current medicines are reviewed at length with the patient today.  Concerns regarding medicines are outlined above.  No orders of the defined types were placed in this encounter.  No orders of the defined types were placed in this encounter.   Signed, TFransico Him MD  12/05/2022 9:37 AM    [place]

## 2022-12-03 DIAGNOSIS — E871 Hypo-osmolality and hyponatremia: Secondary | ICD-10-CM | POA: Diagnosis not present

## 2022-12-03 DIAGNOSIS — G4733 Obstructive sleep apnea (adult) (pediatric): Secondary | ICD-10-CM | POA: Diagnosis not present

## 2022-12-05 ENCOUNTER — Ambulatory Visit: Payer: PPO | Attending: Cardiology | Admitting: Cardiology

## 2022-12-05 ENCOUNTER — Other Ambulatory Visit: Payer: Self-pay | Admitting: Cardiology

## 2022-12-05 ENCOUNTER — Encounter: Payer: Self-pay | Admitting: Cardiology

## 2022-12-05 VITALS — BP 116/72 | HR 80 | Ht 61.5 in | Wt 160.0 lb

## 2022-12-05 DIAGNOSIS — E785 Hyperlipidemia, unspecified: Secondary | ICD-10-CM

## 2022-12-05 DIAGNOSIS — I10 Essential (primary) hypertension: Secondary | ICD-10-CM

## 2022-12-05 DIAGNOSIS — I420 Dilated cardiomyopathy: Secondary | ICD-10-CM

## 2022-12-05 DIAGNOSIS — R072 Precordial pain: Secondary | ICD-10-CM | POA: Diagnosis not present

## 2022-12-05 DIAGNOSIS — I2510 Atherosclerotic heart disease of native coronary artery without angina pectoris: Secondary | ICD-10-CM

## 2022-12-05 DIAGNOSIS — I4819 Other persistent atrial fibrillation: Secondary | ICD-10-CM

## 2022-12-05 DIAGNOSIS — R002 Palpitations: Secondary | ICD-10-CM

## 2022-12-05 NOTE — Addendum Note (Signed)
Addended by: Bernestine Amass on: 12/05/2022 09:56 AM   Modules accepted: Orders

## 2022-12-05 NOTE — Addendum Note (Signed)
Addended by: Bernestine Amass on: 12/05/2022 09:51 AM   Modules accepted: Orders

## 2022-12-05 NOTE — Patient Instructions (Signed)
Medication Instructions:  Your physician recommends that you continue on your current medications as directed. Please refer to the Current Medication list given to you today.  *If you need a refill on your cardiac medications before your next appointment, please call your pharmacy*  Testing/Procedures: Your physician has requested that you have a lexiscan myoview. For further information please visit [email or web address]. Please follow instruction sheet, as given.  Your physician has recommended that you wear an event monitor. Event monitors are medical devices that record the heart's electrical activity. Doctors most often [place] these monitors to diagnose arrhythmias. Arrhythmias are problems with the speed or rhythm of the heartbeat. The monitor is a small, portable device. You can wear one while you do your normal daily activities. This is usually used to diagnose what is causing palpitations/syncope (passing out).  Follow-Up: At [facility], you and your health needs are our priority.  As part of our continuing mission to provide you with exceptional heart care, we have created designated Provider Care Teams.  These Care Teams include your primary Cardiologist (physician) and Advanced Practice Providers (APPs -  Physician Assistants and Nurse Practitioners) who all work together to provide you with the care you need, when you need it.  Your next appointment:   1 year(s)  The format for your next appointment:   In Person  Provider:   Fransico Him, MD      Important Information About Sugar

## 2022-12-05 NOTE — Addendum Note (Signed)
Addended by: Fransico Him R on: 12/05/2022 11:09 AM   Modules accepted: Orders

## 2022-12-10 ENCOUNTER — Telehealth (HOSPITAL_COMMUNITY): Payer: Self-pay | Admitting: *Deleted

## 2022-12-10 NOTE — Telephone Encounter (Signed)

## 2022-12-15 ENCOUNTER — Ambulatory Visit (HOSPITAL_COMMUNITY): Payer: PPO | Attending: Cardiology

## 2022-12-15 ENCOUNTER — Telehealth: Payer: Self-pay

## 2022-12-15 DIAGNOSIS — R072 Precordial pain: Secondary | ICD-10-CM | POA: Diagnosis not present

## 2022-12-15 LAB — MYOCARDIAL PERFUSION IMAGING
LV dias vol: 62 mL (ref 46–106)
LV sys vol: 26 mL
Nuc Stress EF: 58 %
Peak HR: 101 {beats}/min
Rest HR: 65 {beats}/min
Rest Nuclear Isotope Dose: 10.6 mCi
SDS: 3
SRS: 1
SSS: 4
ST Depression (mm): 0 mm
Stress Nuclear Isotope Dose: 32.3 mCi
TID: 0.91

## 2022-12-15 MED ORDER — REGADENOSON 0.4 MG/5ML IV SOLN
0.4000 mg | Freq: Once | INTRAVENOUS | Status: AC
  Administered 2022-12-15: 0.4 mg via INTRAVENOUS

## 2022-12-15 MED ORDER — TECHNETIUM TC 99M TETROFOSMIN IV KIT
32.3000 | PACK | Freq: Once | INTRAVENOUS | Status: AC | PRN
  Administered 2022-12-15: 32.3 via INTRAVENOUS

## 2022-12-15 MED ORDER — AMINOPHYLLINE 25 MG/ML IV SOLN
75.0000 mg | Freq: Once | INTRAVENOUS | Status: AC
  Administered 2022-12-15: 75 mg via INTRAVENOUS

## 2022-12-15 MED ORDER — TECHNETIUM TC 99M TETROFOSMIN IV KIT
10.6000 | PACK | Freq: Once | INTRAVENOUS | Status: AC | PRN
  Administered 2022-12-15: 10.6 via INTRAVENOUS

## 2022-12-15 NOTE — Telephone Encounter (Signed)
The patient has been notified of the result and verbalized understanding.  All questions (if any) were answered.     

## 2022-12-16 NOTE — Telephone Encounter (Signed)
Attempted phone call to pt and left voicemail message to contact triage at [phone]. 

## 2022-12-17 NOTE — Telephone Encounter (Signed)
Unable to each the pt... her "call screening" picks up and then hangs up... will send her a My Chart message.    Per Dr Radford Pax:   Please get a coronary CTA due to CP

## 2022-12-22 DIAGNOSIS — D649 Anemia, unspecified: Secondary | ICD-10-CM | POA: Diagnosis not present

## 2022-12-23 ENCOUNTER — Telehealth: Payer: Self-pay | Admitting: Cardiology

## 2022-12-23 DIAGNOSIS — R072 Precordial pain: Secondary | ICD-10-CM

## 2022-12-23 NOTE — Telephone Encounter (Signed)
Tabitha Margarita, MD  to [facility] Triage Please get a coronary CTA due to CP   See previous phone note. Patient was called in regards to orders for a cardiac CTA.  Spoke with the patient and made her aware. Coronary CTA has been ordered. Will send patient instructions in the mail.

## 2022-12-23 NOTE — Telephone Encounter (Signed)
Patient states she was returning call. Please advise  

## 2022-12-23 NOTE — Telephone Encounter (Signed)
Tabitha Amass, RN      12/23/22  1:52 PM Note Sueanne Margarita, MD  to Tabitha Murphy Triage Please get a coronary CTA due to CP     See previous phone note. Patient was called in regards to orders for a cardiac CTA.  Spoke with the patient and made her aware. Coronary CTA has been ordered. Will send patient instructions in the mail.

## 2022-12-25 ENCOUNTER — Telehealth (HOSPITAL_COMMUNITY): Payer: Self-pay | Admitting: *Deleted

## 2022-12-25 NOTE — Telephone Encounter (Signed)
Attempted to call patient regarding upcoming cardiac CT appointment. °Left message on voicemail with name and callback number ° °Charle Clear RN Navigator Cardiac Imaging °[facility] °[phone] Office °[phone] Cell ° °

## 2022-12-26 ENCOUNTER — Ambulatory Visit (HOSPITAL_COMMUNITY)
Admission: RE | Admit: 2022-12-26 | Discharge: 2022-12-26 | Disposition: A | Discharge status: Home / Self Care | Payer: PPO | Source: Ambulatory Visit | Attending: Cardiology | Admitting: Cardiology

## 2022-12-26 DIAGNOSIS — R072 Precordial pain: Secondary | ICD-10-CM

## 2022-12-26 DIAGNOSIS — I700 Atherosclerosis of aorta: Secondary | ICD-10-CM

## 2022-12-26 MED ORDER — IOHEXOL 350 MG/ML SOLN
100.0000 mL | Freq: Once | INTRAVENOUS | Status: AC | PRN
  Administered 2022-12-26: 100 mL via INTRAVENOUS

## 2022-12-26 MED ORDER — NITROGLYCERIN 0.4 MG SL SUBL
0.8000 mg | SUBLINGUAL_TABLET | Freq: Once | SUBLINGUAL | Status: AC
  Administered 2022-12-26: 0.8 mg via SUBLINGUAL

## 2022-12-26 MED ORDER — NITROGLYCERIN 0.4 MG SL SUBL
SUBLINGUAL_TABLET | SUBLINGUAL | Status: AC
  Filled 2022-12-26: qty 2

## 2022-12-27 ENCOUNTER — Encounter: Payer: Self-pay | Admitting: Cardiology

## 2023-01-01 ENCOUNTER — Telehealth: Payer: Self-pay | Admitting: Cardiology

## 2023-01-01 ENCOUNTER — Telehealth: Payer: Self-pay

## 2023-01-01 DIAGNOSIS — E785 Hyperlipidemia, unspecified: Secondary | ICD-10-CM

## 2023-01-01 DIAGNOSIS — M545 Low back pain, unspecified: Secondary | ICD-10-CM | POA: Diagnosis not present

## 2023-01-01 DIAGNOSIS — G4733 Obstructive sleep apnea (adult) (pediatric): Secondary | ICD-10-CM | POA: Diagnosis not present

## 2023-01-01 NOTE — Telephone Encounter (Signed)
Monitor was ordered to be worn in February.  As of 01/01/23 monitor has not shipped. [employer] representative stated because shipping address could not be verified with patient.  However only one attempt was made.  Verified shipping address with representative and monitor should be delivered in 3-5 days.

## 2023-01-01 NOTE — Telephone Encounter (Signed)
-----  Message from Gershon Crane, LPN sent at 5/79/0383  9:41 AM EST -----  ----- Message ----- From: Sueanne Margarita, MD Sent: 12/27/2022   3:13 PM EST To: [facility] Triage  Coronary CTA showed minimal mixed nonobstructive disease.  Less than 25% ostial LAD and left circumflex with coronary calcium score 41.7.  Please have her come in for fasting lipid panel and ALT.  No aspirin due to DOAC.

## 2023-01-01 NOTE — Telephone Encounter (Signed)
Patient states she has not received the heart monitor yet. She says says it is okay to leave a detailed vm.

## 2023-01-01 NOTE — Telephone Encounter (Signed)
Reviewed CT and CTA results with patient who verbalized understanding of non-obstructive disease. Discussed Dr. Theodosia Blender recommendation for fasting lipid panel and ALT, patient agrees to plan. Orders placed, labs scheduled.

## 2023-01-02 ENCOUNTER — Ambulatory Visit: Payer: PPO | Attending: Cardiology

## 2023-01-02 DIAGNOSIS — E785 Hyperlipidemia, unspecified: Secondary | ICD-10-CM

## 2023-01-02 DIAGNOSIS — R072 Precordial pain: Secondary | ICD-10-CM

## 2023-01-02 LAB — ALT: ALT: 11 IU/L (ref 0–32)

## 2023-01-02 LAB — BASIC METABOLIC PANEL
BUN/Creatinine Ratio: 10 — ABNORMAL LOW (ref 12–28)
BUN: 8 mg/dL (ref 8–27)
CO2: 27 mmol/L (ref 20–29)
Calcium: 9.4 mg/dL (ref 8.7–10.3)
Chloride: 96 mmol/L (ref 96–106)
Creatinine, Ser: 0.79 mg/dL (ref 0.57–1.00)
Glucose: 108 mg/dL — ABNORMAL HIGH (ref 70–99)
Potassium: 3.9 mmol/L (ref 3.5–5.2)
Sodium: 136 mmol/L (ref 134–144)
eGFR: 75 mL/min/{1.73_m2} (ref >59)

## 2023-01-02 LAB — LIPID PANEL
Chol/HDL Ratio: 2.7 ratio (ref 0.0–4.4)
Cholesterol, Total: 130 mg/dL (ref 100–199)
HDL: 49 mg/dL (ref >39)
LDL Chol Calc (NIH): 59 mg/dL (ref 0–99)
Triglycerides: 124 mg/dL (ref 0–149)
VLDL Cholesterol Cal: 22 mg/dL (ref 5–40)

## 2023-01-06 DIAGNOSIS — L309 Dermatitis, unspecified: Secondary | ICD-10-CM | POA: Diagnosis not present

## 2023-01-07 ENCOUNTER — Telehealth: Payer: Self-pay

## 2023-01-07 DIAGNOSIS — I2510 Atherosclerotic heart disease of native coronary artery without angina pectoris: Secondary | ICD-10-CM | POA: Diagnosis not present

## 2023-01-07 DIAGNOSIS — I4819 Other persistent atrial fibrillation: Secondary | ICD-10-CM

## 2023-01-07 DIAGNOSIS — E785 Hyperlipidemia, unspecified: Secondary | ICD-10-CM

## 2023-01-07 DIAGNOSIS — I10 Essential (primary) hypertension: Secondary | ICD-10-CM | POA: Diagnosis not present

## 2023-01-07 DIAGNOSIS — R002 Palpitations: Secondary | ICD-10-CM

## 2023-01-07 DIAGNOSIS — R072 Precordial pain: Secondary | ICD-10-CM | POA: Diagnosis not present

## 2023-01-07 DIAGNOSIS — I420 Dilated cardiomyopathy: Secondary | ICD-10-CM | POA: Diagnosis not present

## 2023-01-07 NOTE — Telephone Encounter (Signed)
-----   Message from Sueanne Margarita, MD sent at 01/03/2023  7:55 PM EST ----- Please let patient know that labs were normal.  Continue current medical therapy.

## 2023-01-07 NOTE — Telephone Encounter (Signed)
Results reviewed with patient who verbalized understanding.  

## 2023-01-12 ENCOUNTER — Other Ambulatory Visit: Payer: Self-pay | Admitting: Gastroenterology

## 2023-01-12 DIAGNOSIS — R1310 Dysphagia, unspecified: Secondary | ICD-10-CM

## 2023-01-12 DIAGNOSIS — R1312 Dysphagia, oropharyngeal phase: Secondary | ICD-10-CM | POA: Diagnosis not present

## 2023-01-19 ENCOUNTER — Telehealth: Payer: Self-pay

## 2023-01-19 NOTE — Telephone Encounter (Signed)
   Cardiac Monitor Alert  Date of alert:  01/19/2023   Patient Name: Tabitha Murphy  DOB: 01-17-1941  MRN: [id number]   [place] Cardiologist: Fransico Him, MD  [facility] EP:  None    Monitor Information: Cardiac Event Monitor [[employer]]  Reason:  Determine A-fib Burden Ordering provider:  Fransico Him, MD   Alert Atrial Fibrillation/Flutter This is the 1st alert for this rhythm.  The patient has a hx of Atrial Fibrillation/Flutter.  The patient is currently on anticoagulation.  Anticoagulation medication as of 01/19/2023           ELIQUIS 5 MG TABS tablet TAKE 1 TABLET BY MOUTH TWICE A DAY       Next Cardiology Appointment   Date:  RECALL  Provider:  Dr. Fransico Him  The patient was contacted today.  She is asymptomatic. Arrhythmia, symptoms and history reviewed with DOD Dr. Acie Fredrickson.  Plan:  Continue to monitor.    Other:   [name], RN  01/19/2023 1:53 PM

## 2023-01-22 ENCOUNTER — Telehealth: Payer: Self-pay | Admitting: *Deleted

## 2023-01-22 NOTE — Telephone Encounter (Signed)
Afib clinic appointment scheduled.  Directions/location/phone number provided.

## 2023-01-22 NOTE — Telephone Encounter (Signed)
Left message for patient to call back.  Needs scheduled back w afib clinic per Dr. Radford Pax.

## 2023-01-22 NOTE — Telephone Encounter (Signed)
-----   Message from Bernestine Amass, RN sent at 01/20/2023  5:04 PM EST -----  ----- Message ----- From: [name], RN Sent: 01/20/2023   5:03 PM EST To: [facility] [address] Triage   ----- Message ----- From: Sueanne Margarita, MD Sent: 01/19/2023   8:22 PM EST To: [name], RN  Please get back in with afib clinic ----- Message ----- From: [name], RN Sent: 01/19/2023   2:19 PM EST To: Sueanne Margarita, MD  Juluis Rainier

## 2023-01-29 ENCOUNTER — Ambulatory Visit (HOSPITAL_COMMUNITY)
Admission: RE | Admit: 2023-01-29 | Discharge: 2023-01-29 | Disposition: A | Discharge status: Home / Self Care | Payer: PPO | Source: Ambulatory Visit | Attending: Nurse Practitioner | Admitting: Nurse Practitioner

## 2023-01-29 VITALS — BP 126/66 | HR 61 | Ht 61.5 in | Wt 164.4 lb

## 2023-01-29 DIAGNOSIS — I4891 Unspecified atrial fibrillation: Secondary | ICD-10-CM | POA: Diagnosis not present

## 2023-01-29 DIAGNOSIS — E669 Obesity, unspecified: Secondary | ICD-10-CM | POA: Insufficient documentation

## 2023-01-29 DIAGNOSIS — D6869 Other thrombophilia: Secondary | ICD-10-CM | POA: Insufficient documentation

## 2023-01-29 DIAGNOSIS — I4819 Other persistent atrial fibrillation: Secondary | ICD-10-CM | POA: Insufficient documentation

## 2023-01-29 DIAGNOSIS — I2510 Atherosclerotic heart disease of native coronary artery without angina pectoris: Secondary | ICD-10-CM | POA: Insufficient documentation

## 2023-01-29 DIAGNOSIS — Z7901 Long term (current) use of anticoagulants: Secondary | ICD-10-CM | POA: Diagnosis not present

## 2023-01-29 DIAGNOSIS — G4733 Obstructive sleep apnea (adult) (pediatric): Secondary | ICD-10-CM | POA: Diagnosis not present

## 2023-01-29 DIAGNOSIS — E785 Hyperlipidemia, unspecified: Secondary | ICD-10-CM | POA: Insufficient documentation

## 2023-01-29 DIAGNOSIS — I10 Essential (primary) hypertension: Secondary | ICD-10-CM | POA: Insufficient documentation

## 2023-01-29 DIAGNOSIS — E039 Hypothyroidism, unspecified: Secondary | ICD-10-CM | POA: Diagnosis not present

## 2023-01-29 DIAGNOSIS — Z6829 Body mass index (BMI) 29.0-29.9, adult: Secondary | ICD-10-CM | POA: Diagnosis not present

## 2023-01-29 DIAGNOSIS — Z79899 Other long term (current) drug therapy: Secondary | ICD-10-CM | POA: Diagnosis not present

## 2023-01-29 DIAGNOSIS — Z8249 Family history of ischemic heart disease and other diseases of the circulatory system: Secondary | ICD-10-CM | POA: Insufficient documentation

## 2023-01-29 MED ORDER — DILTIAZEM HCL 60 MG PO TABS: 60.0000 mg | ORAL_TABLET | Freq: Two times a day (BID) | ORAL | 3 refills | Status: DC

## 2023-01-29 NOTE — Progress Notes (Signed)
Primary Care Physician: [name], MD (Inactive) Primary Cardiologist: Dr Radford Pax Primary Electrophysiologist: none Referring Physician: Dr Marzetta Merino is a 82 y.o. female with a history of CAD, HTN, OSA, HLD, hypothyroidism, atrial fibrillation who presents for follow up in the [facility]. The patient was initially diagnosed with atrial fibrillation 01/2021 after presenting to the ED with symptoms of intermittent CP. She was in rate controlled afib. Echo showed EF 45-50% with hypokinetic basal segments. Lexiscan did not show ischemia. She was discharged in rate controlled afib with plan for outpatient DCCV but converted spontaneously on her own. Patient is on Eliquis for a CHADS2VASC score of 5. She reports that she went back into afib since 05/04/21 with symptoms of palpitations and heart racing. There were no specific triggers that she could identify. She is compliant with CPAP therapy.   On follow up today, patient reports that she has almost daily symptoms of palpitations. She has a smart watch which intermittently shows fast heart rates. No other associated symptoms. There are no specific triggers that she can identify. She remains very active doing water aerobics daily.  F/u in afib clinic, 01/29/23. She was referred back by Dr. Radford Pax. She is wearing an event monitor. Last monitor worn, 02/11/22,  last year showed  no afib with triggered episodes consistent with SR. She saw Dr. Radford Pax recently and c/o of infrequent palpitations.  She placed another event monitor which she is still wearing. EKG today shows SR. She also tells me that she is having issues swallowing and takes the diltiazem tablet apart to take.She states she was informed x one episode that she had afib since wearing the monitor. She was asymptomatic.  She is compliant  with eliquis.   Today, she denies symptoms of CP, shortness of breath, orthopnea, PND, lower extremity edema, dizziness,  presyncope, syncope, bleeding, or neurologic sequela. The patient is tolerating medications without difficulties and is otherwise without complaint today.    Atrial Fibrillation Risk Factors:  she does have symptoms or diagnosis of sleep apnea. she is compliant with CPAP therapy. she does have a history of rheumatic fever. she does not have a history of alcohol use. The patient does not have a history of early familial atrial fibrillation or other arrhythmias.  she has a BMI of Body mass index is 29.74 kg/m.Marland Kitchen There were no vitals filed for this visit.     Family History  Problem Relation Age of Onset   Cervical cancer Mother    CAD Sister    Heart attack Father        Died in early 68s with MI   Heart disease Brother    Colon cancer Neg Hx      Atrial Fibrillation Management history:  Previous antiarrhythmic drugs: none Previous cardioversions: none Previous ablations: none CHADS2VASC score: 5 Anticoagulation history: Eliquis   Past Medical History:  Diagnosis Date   Benign essential HTN    CAD (coronary artery disease), native coronary artery    Cath 2012 with 30% LCx; Coronary CTA showed minimal mixed nonobstructive disease.  Less than 25% ostial LAD and left circumflex with coronary calcium score 41.7 on  12/2022   DCM (dilated cardiomyopathy) ([place])    ? tachy mediated from afib with RVR.  EF 45-50% by echo 01/2021.  Lexiscan myoview with no ischemia   Diverticulosis    Hiatal hernia    HLD (hyperlipidemia)    Persistent atrial fibrillation ([place])  On DOAC   S/P laparoscopic cholecystectomy 07/28/2011   23 years ago   Thyroid goiter    Past Surgical History:  Procedure Laterality Date   child birth     x3   CHOLECYSTECTOMY      Current Outpatient Medications  Medication Sig Dispense Refill   albuterol (VENTOLIN HFA) 108 (90 Base) MCG/ACT inhaler Inhale 2 puffs into the lungs every 4 (four) hours as needed for wheezing or shortness of breath.      ALPRAZolam (XANAX) 0.5 MG tablet Take 0.5 mg by mouth daily as needed for anxiety.     Apoaequorin (PREVAGEN) 10 MG CAPS Take 1 capsule by mouth daily.     atenolol (TENORMIN) 100 MG tablet Take 100 mg by mouth daily.     b complex vitamins tablet Take 1 tablet by mouth daily.     Biotin 10000 MCG TBDP Take 1 tablet by mouth daily.     cyclobenzaprine (FLEXERIL) 10 MG tablet Take 5 mg by mouth 3 (three) times daily as needed for muscle spasms.     DILT-XR 120 MG 24 hr capsule Take 120 mg by mouth every morning.     ELIQUIS 5 MG TABS tablet TAKE 1 TABLET BY MOUTH TWICE A DAY 180 tablet 1   furosemide (LASIX) 20 MG tablet Take one tablet (20 mg) as needed for swelling but call the office if taking more than twice a week. 90 tablet 3   hydrochlorothiazide (MICROZIDE) 12.5 MG capsule Take 1 capsule (12.5 mg total) by mouth daily. 90 capsule 3   HYDROcodone-acetaminophen (NORCO/VICODIN) 5-325 MG tablet Take 1 tablet by mouth every 6 (six) hours as needed for moderate pain.     irbesartan (AVAPRO) 300 MG tablet Take 300 mg by mouth every morning.     levothyroxine (SYNTHROID) 25 MCG tablet Take 25 mcg by mouth every morning.     Multiple Vitamin (MULTIVITAMIN) tablet Take 1 tablet by mouth daily.     omeprazole (PRILOSEC OTC) 20 MG tablet Take 20 mg by mouth daily.     promethazine (PHENERGAN) 25 MG tablet Take 25 mg by mouth every 6 (six) hours as needed for nausea or vomiting.     rosuvastatin (CRESTOR) 5 MG tablet Take 1 tablet (5 mg total) by mouth daily. 90 tablet 2   Selenium 100 MCG TABS Take 1 tablet by mouth daily.     valACYclovir (VALTREX) 1000 MG tablet Take 1,000 mg by mouth daily as needed (fever blisters).     vitamin C (ASCORBIC ACID) 500 MG tablet Take 500 mg by mouth daily.     No current facility-administered medications for this encounter.    Allergies  Allergen Reactions   Amlodipine Besylate     Other reaction(s): swelling along with aches and pains   Codeine      REACTION: nausea   [place] Control     Other reaction(s): skin around mouth comes off   Doxazosin Mesylate     Other reaction(s): SOB   Duloxetine Diarrhea   Duloxetine Hcl     Other reaction(s): Heaviness and pains in chest and back/dry mouth   Gabapentin     Other reaction(s): causes aches and pains   Lyrica [Pregabalin] Other (See Comments)    Made pain in legs and feet worse.   Rosuvastatin Calcium     Other reaction(s): knee and leg pains   Zolpidem Tartrate     Other reaction(s): retrograde amnesia    Social History   Socioeconomic  History   Marital status: Widowed    Spouse name: Not on file   Number of children: Not on file   Years of education: Not on file   Highest education level: Not on file  Occupational History   Occupation: retired  Tobacco Use   Smoking status: Never   Smokeless tobacco: Never   Tobacco comments:    Never smoke 02/18/22  Substance and Sexual Activity   Alcohol use: No   Drug use: No    Comment: CBD gummies   Sexual activity: Not on file  Other Topics Concern   Not on file  Social History Narrative   Not on file   Social Determinants of Health   Financial Resource Strain: Not on file  Food Insecurity: Not on file  Transportation Needs: Not on file  Physical Activity: Not on file  Stress: Not on file  Social Connections: Not on file  Intimate Partner Violence: Not on file     ROS- All systems are reviewed and negative except as per the HPI above.  Physical Exam: Vitals:   01/29/23 1008  Height: 5' 1.5" (1.562 m)    GEN- The patient is a well appearing elderly female, alert and oriented x 3 today.   HEENT-head normocephalic, atraumatic, sclera clear, conjunctiva pink, hearing intact, trachea midline. Lungs- Clear to ausculation bilaterally, normal work of breathing Heart- Regular rate and rhythm, no murmurs, rubs or gallops  GI- soft, NT, ND, + BS Extremities- no clubbing, cyanosis, or edema MS- no significant  deformity or atrophy Skin- no rash or lesion Psych- euthymic mood, full affect Neuro- strength and sensation are intact   Wt Readings from Last 3 Encounters:  12/05/22 72.6 kg  07/23/22 74.8 kg  02/18/22 77 kg    EKG today demonstrates   Vent. rate 61 BPM PR interval 160 ms QRS duration 80 ms QT/QTcB 414/416 ms P-R-T axes * 59 69 Sinus rhythm with Premature atrial complexes Low voltage QRS Borderline ECG When compared with ECG of 18-Feb-2022 14:05, PREVIOUS ECG IS PRESENT  Echo 02/15/21 demonstrated  1. Left ventricular ejection fraction, by estimation, is 45 to 50%. The  left ventricle has mildly decreased function. Left ventricular endocardial  border not optimally defined to evaluate regional wall motion. Basal  segments are hypokinetic with best preserved function in mid and apex. Left ventricular diastolic parameters are indeterminate.   2. Right ventricular systolic function is mildly reduced. The right  ventricular size is normal. Tricuspid regurgitation signal is inadequate  for assessing PA pressure.   3. Left atrial size was mildly dilated.   4. Right atrial size was mildly dilated.   5. The mitral valve is normal in structure. Trivial mitral valve  regurgitation. No evidence of mitral stenosis.   6. The aortic valve is grossly normal. There is mild calcification of the  aortic valve. Aortic valve regurgitation is not visualized. No aortic  stenosis is present.   7. The inferior vena cava is normal in size with greater than 50%  respiratory variability, suggesting right atrial pressure of 3 mmHg.   Epic records are reviewed at length today  CHA2DS2-VASc Score = 5  The patient's score is based upon: CHF History: 0 HTN History: 1 Diabetes History: 0 Stroke History: 0 Vascular Disease History: 1 Age Score: 2 Gender Score: 1     ASSESSMENT AND PLAN: 1. Persistent Atrial Fibrillation (ICD10:  I48.19) The patient's CHA2DS2-VASc score is 5, indicating a  7.2% annual risk of stroke.  Patient having frequent palpitations, wearing an event monitor, one call from monitor company to alert her she had afib, she was symptomatic. Continue to wear [employer] monitor to assess rhythm burden completes around 02/06/23 Will tweak meds, atenolol 50 mg am and 50 mg pm, instead of 100 mg in am and will change cardizem to 60 mg at breakfast and 60 mg at  supper as she can not swallow the capsule  2. Secondary Hypercoagulable State (ICD10:  D68.69) The patient is at significant risk for stroke/thromboembolism based upon her CHA2DS2-VASc Score of 5.  Continue Apixaban (Eliquis).   3. Obesity Body mass index is 29.74 kg/m. Lifestyle modification was discussed and encouraged including regular physical activity and weight reduction.  4. Obstructive sleep apnea Patient reports compliance with CPAP therapy.  5. CAD Non obstructive by [facility] 2012 No anginal symptoms.  6. HTN Stable, no changes today.   Follow up in the AF clinic in 3-4 weeks.   Geroge Baseman Taylie Helder, [facility] [address] [place], [place] [address] [phone]

## 2023-01-29 NOTE — Patient Instructions (Signed)
Stop cardizem '120mg'$     Start Cardizem '60mg'$  twice a day  Change atenolol to 1/2 tablet twice a day (1/2 of the '100mg'$  tablet twice a day)

## 2023-01-30 DIAGNOSIS — G4733 Obstructive sleep apnea (adult) (pediatric): Secondary | ICD-10-CM | POA: Diagnosis not present

## 2023-01-30 DIAGNOSIS — M545 Low back pain, unspecified: Secondary | ICD-10-CM | POA: Diagnosis not present

## 2023-02-13 ENCOUNTER — Ambulatory Visit: Payer: PPO | Attending: Cardiology

## 2023-02-13 ENCOUNTER — Other Ambulatory Visit: Payer: PPO

## 2023-02-13 DIAGNOSIS — I4819 Other persistent atrial fibrillation: Secondary | ICD-10-CM

## 2023-02-13 DIAGNOSIS — E785 Hyperlipidemia, unspecified: Secondary | ICD-10-CM

## 2023-02-13 DIAGNOSIS — R002 Palpitations: Secondary | ICD-10-CM

## 2023-02-13 DIAGNOSIS — I420 Dilated cardiomyopathy: Secondary | ICD-10-CM

## 2023-02-13 DIAGNOSIS — I10 Essential (primary) hypertension: Secondary | ICD-10-CM

## 2023-02-13 DIAGNOSIS — I2510 Atherosclerotic heart disease of native coronary artery without angina pectoris: Secondary | ICD-10-CM

## 2023-02-13 DIAGNOSIS — R072 Precordial pain: Secondary | ICD-10-CM

## 2023-02-16 ENCOUNTER — Other Ambulatory Visit: Payer: PPO

## 2023-02-17 DIAGNOSIS — G4733 Obstructive sleep apnea (adult) (pediatric): Secondary | ICD-10-CM | POA: Diagnosis not present

## 2023-02-19 DIAGNOSIS — J45909 Unspecified asthma, uncomplicated: Secondary | ICD-10-CM | POA: Diagnosis not present

## 2023-02-19 DIAGNOSIS — E7800 Pure hypercholesterolemia, unspecified: Secondary | ICD-10-CM | POA: Diagnosis not present

## 2023-02-19 DIAGNOSIS — K219 Gastro-esophageal reflux disease without esophagitis: Secondary | ICD-10-CM | POA: Diagnosis not present

## 2023-02-19 DIAGNOSIS — E039 Hypothyroidism, unspecified: Secondary | ICD-10-CM | POA: Diagnosis not present

## 2023-02-19 DIAGNOSIS — I4891 Unspecified atrial fibrillation: Secondary | ICD-10-CM | POA: Diagnosis not present

## 2023-02-19 DIAGNOSIS — I10 Essential (primary) hypertension: Secondary | ICD-10-CM | POA: Diagnosis not present

## 2023-02-20 ENCOUNTER — Other Ambulatory Visit: Payer: PPO

## 2023-02-20 ENCOUNTER — Other Ambulatory Visit: Payer: Self-pay | Admitting: Cardiology

## 2023-02-23 ENCOUNTER — Other Ambulatory Visit: Payer: PPO

## 2023-02-27 ENCOUNTER — Ambulatory Visit (HOSPITAL_COMMUNITY): Payer: PPO | Admitting: Nurse Practitioner

## 2023-03-02 ENCOUNTER — Ambulatory Visit
Admission: RE | Admit: 2023-03-02 | Discharge: 2023-03-02 | Disposition: A | Discharge status: Home / Self Care | Payer: PPO | Source: Ambulatory Visit | Attending: Gastroenterology | Admitting: Gastroenterology

## 2023-03-02 ENCOUNTER — Other Ambulatory Visit: Payer: PPO

## 2023-03-02 DIAGNOSIS — R1310 Dysphagia, unspecified: Secondary | ICD-10-CM

## 2023-03-02 DIAGNOSIS — M545 Low back pain, unspecified: Secondary | ICD-10-CM | POA: Diagnosis not present

## 2023-03-02 DIAGNOSIS — K224 Dyskinesia of esophagus: Secondary | ICD-10-CM | POA: Diagnosis not present

## 2023-03-02 DIAGNOSIS — R1312 Dysphagia, oropharyngeal phase: Secondary | ICD-10-CM | POA: Diagnosis not present

## 2023-03-02 DIAGNOSIS — G4733 Obstructive sleep apnea (adult) (pediatric): Secondary | ICD-10-CM | POA: Diagnosis not present

## 2023-03-03 ENCOUNTER — Other Ambulatory Visit: Payer: PPO

## 2023-03-04 ENCOUNTER — Encounter (HOSPITAL_COMMUNITY): Payer: Self-pay | Admitting: Physician Assistant

## 2023-03-04 ENCOUNTER — Ambulatory Visit (HOSPITAL_COMMUNITY)
Admission: RE | Admit: 2023-03-04 | Discharge: 2023-03-04 | Disposition: A | Discharge status: Home / Self Care | Payer: PPO | Source: Ambulatory Visit | Attending: Physician Assistant | Admitting: Physician Assistant

## 2023-03-04 ENCOUNTER — Other Ambulatory Visit: Payer: Self-pay | Admitting: Cardiology

## 2023-03-04 VITALS — BP 140/78 | HR 62 | Ht 61.5 in | Wt 170.4 lb

## 2023-03-04 DIAGNOSIS — E039 Hypothyroidism, unspecified: Secondary | ICD-10-CM | POA: Insufficient documentation

## 2023-03-04 DIAGNOSIS — I4819 Other persistent atrial fibrillation: Secondary | ICD-10-CM

## 2023-03-04 DIAGNOSIS — G4733 Obstructive sleep apnea (adult) (pediatric): Secondary | ICD-10-CM | POA: Insufficient documentation

## 2023-03-04 DIAGNOSIS — Z79899 Other long term (current) drug therapy: Secondary | ICD-10-CM | POA: Insufficient documentation

## 2023-03-04 DIAGNOSIS — E785 Hyperlipidemia, unspecified: Secondary | ICD-10-CM | POA: Insufficient documentation

## 2023-03-04 DIAGNOSIS — Z7901 Long term (current) use of anticoagulants: Secondary | ICD-10-CM | POA: Insufficient documentation

## 2023-03-04 DIAGNOSIS — I2510 Atherosclerotic heart disease of native coronary artery without angina pectoris: Secondary | ICD-10-CM | POA: Insufficient documentation

## 2023-03-04 DIAGNOSIS — I10 Essential (primary) hypertension: Secondary | ICD-10-CM | POA: Insufficient documentation

## 2023-03-04 DIAGNOSIS — D6869 Other thrombophilia: Secondary | ICD-10-CM | POA: Diagnosis not present

## 2023-03-04 NOTE — Telephone Encounter (Signed)
Prescription refill request for Eliquis received. Indication: Afib  Last office visit: 01/29/23 Kayleen Memos)  Scr: 0.79 (01/02/23) Age: 82 Weight: 74.6kg  Appropriate dose. Refill sent.

## 2023-03-04 NOTE — Progress Notes (Signed)
Primary Care Physician: [name], MD (Inactive) Primary Cardiologist: Dr Radford Pax Primary Electrophysiologist: none Referring Physician: Dr Marzetta Merino is a 82 y.o. female with a history of CAD, HTN, OSA, HLD, hypothyroidism, atrial fibrillation who presents for follow up in the [facility]. The patient was initially diagnosed with atrial fibrillation 01/2021 after presenting to the ED with symptoms of intermittent CP. She was in rate controlled afib. Echo showed EF 45-50% with hypokinetic basal segments. Lexiscan did not show ischemia. She was discharged in rate controlled afib with plan for outpatient DCCV but converted spontaneously on her own. Patient is on Eliquis for a CHADS2VASC score of 5. She reports that she went back into afib since 05/04/21 with symptoms of palpitations and heart racing. There were no specific triggers that she could identify. She is compliant with CPAP therapy.   She has worn both a Zio monitor and an event monitor. The 30 day event monitor only showed 1 hour total of rate controlled afib and was asymptomatic.   On follow up today, patient reports that she has felt well since her last visit. She does have rare palpitations and dizziness. No bleeding issues on anticoagulation.   Today, she denies symptoms of chest pain, shortness of breath, orthopnea, PND, lower extremity edema, presyncope, syncope, bleeding, or neurologic sequela. The patient is tolerating medications without difficulties and is otherwise without complaint today.    Atrial Fibrillation Risk Factors:  she does have symptoms or diagnosis of sleep apnea. she is compliant with CPAP therapy. she does have a history of rheumatic fever. she does not have a history of alcohol use. The patient does not have a history of early familial atrial fibrillation or other arrhythmias.  she has a BMI of Body mass index is 31.68 kg/m.Marland Kitchen Filed Weights   03/04/23 1317   Weight: 77.3 kg    Family History  Problem Relation Age of Onset   Cervical cancer Mother    CAD Sister    Heart attack Father        Died in early 19s with MI   Heart disease Brother    Colon cancer Neg Hx      Atrial Fibrillation Management history:  Previous antiarrhythmic drugs: none Previous cardioversions: none Previous ablations: none CHADS2VASC score: 5 Anticoagulation history: Eliquis   Past Medical History:  Diagnosis Date   Benign essential HTN    CAD (coronary artery disease), native coronary artery    Cath 2012 with 30% LCx; Coronary CTA showed minimal mixed nonobstructive disease.  Less than 25% ostial LAD and left circumflex with coronary calcium score 41.7 on  12/2022   DCM (dilated cardiomyopathy)    ? tachy mediated from afib with RVR.  EF 45-50% by echo 01/2021.  Lexiscan myoview with no ischemia   Diverticulosis    Hiatal hernia    HLD (hyperlipidemia)    Persistent atrial fibrillation    On DOAC   S/P laparoscopic cholecystectomy 07/28/2011   23 years ago   Thyroid goiter    Past Surgical History:  Procedure Laterality Date   child birth     x3   CHOLECYSTECTOMY      Current Outpatient Medications  Medication Sig Dispense Refill   albuterol (VENTOLIN HFA) 108 (90 Base) MCG/ACT inhaler Inhale 2 puffs into the lungs every 4 (four) hours as needed for wheezing or shortness of breath.     ALPRAZolam (XANAX) 0.5 MG tablet Take 0.5 mg by mouth  daily as needed for anxiety.     Apoaequorin (PREVAGEN) 10 MG CAPS Take 1 capsule by mouth daily.     atenolol (TENORMIN) 100 MG tablet Take 50 mg by mouth daily.     augmented betamethasone dipropionate (DIPROLENE-AF) 0.05 % cream Apply topically as needed.     b complex vitamins tablet Take 1 tablet by mouth daily.     Biotin 10000 MCG TBDP Take 1 tablet by mouth daily.     bisacodyl (DULCOLAX) 5 MG EC tablet Take 5 mg by mouth once a week.     cyclobenzaprine (FLEXERIL) 10 MG tablet Take 5 mg by mouth 3  (three) times daily as needed for muscle spasms.     diltiazem (CARDIZEM) 60 MG tablet Take 1 tablet (60 mg total) by mouth 2 (two) times daily. 60 tablet 3   ELIQUIS 5 MG TABS tablet TAKE 1 TABLET BY MOUTH TWICE A DAY 180 tablet 1   furosemide (LASIX) 20 MG tablet Take one tablet (20 mg) as needed for swelling but call the office if taking more than twice a week. 90 tablet 3   hydrochlorothiazide (MICROZIDE) 12.5 MG capsule Take 1 capsule (12.5 mg total) by mouth daily. 90 capsule 3   HYDROcodone-acetaminophen (NORCO/VICODIN) 5-325 MG tablet Take 1 tablet by mouth every 6 (six) hours as needed for moderate pain.     irbesartan (AVAPRO) 300 MG tablet Take 300 mg by mouth every morning.     levothyroxine (SYNTHROID) 25 MCG tablet Take 25 mcg by mouth every morning.     Multiple Vitamin (MULTIVITAMIN) tablet Take 1 tablet by mouth daily.     omeprazole (PRILOSEC OTC) 20 MG tablet Take 20 mg by mouth daily.     promethazine (PHENERGAN) 25 MG tablet Take 25 mg by mouth every 6 (six) hours as needed for nausea or vomiting.     rosuvastatin (CRESTOR) 5 MG tablet TAKE 1 TABLET (5 MG TOTAL) BY MOUTH DAILY. 90 tablet 3   Selenium 100 MCG TABS Take 1 tablet by mouth daily.     valACYclovir (VALTREX) 1000 MG tablet Take 1,000 mg by mouth daily as needed (fever blisters).     vitamin C (ASCORBIC ACID) 500 MG tablet Take 500 mg by mouth daily.     No current facility-administered medications for this encounter.    Allergies  Allergen Reactions   Oxycodone Hcl Other (See Comments)   Amlodipine Besylate     Other reaction(s): swelling along with aches and pains   Codeine     REACTION: nausea   Crest Tartar Control     Other reaction(s): skin around mouth comes off   Doxazosin Mesylate     Other reaction(s): SOB   Duloxetine Diarrhea   Duloxetine Hcl     Other reaction(s): Heaviness and pains in chest and back/dry mouth   Gabapentin     Other reaction(s): causes aches and pains   Lyrica  [Pregabalin] Other (See Comments)    Made pain in legs and feet worse.   Rosuvastatin Calcium     Other reaction(s): knee and leg pains   Zolpidem Tartrate     Other reaction(s): retrograde amnesia    Social History   Socioeconomic History   Marital status: Widowed    Spouse name: Not on file   Number of children: Not on file   Years of education: Not on file   Highest education level: Not on file  Occupational History   Occupation: retired  Tobacco Use  Smoking status: Never   Smokeless tobacco: Never   Tobacco comments:    Never smoke 02/18/22  Substance and Sexual Activity   Alcohol use: No   Drug use: No    Comment: CBD gummies   Sexual activity: Not on file  Other Topics Concern   Not on file  Social History Narrative   Not on file   Social Determinants of Health   Financial Resource Strain: Not on file  Food Insecurity: Not on file  Transportation Needs: Not on file  Physical Activity: Not on file  Stress: Not on file  Social Connections: Not on file  Intimate Partner Violence: Not on file     ROS- All systems are reviewed and negative except as per the HPI above.  Physical Exam: Vitals:   03/04/23 1317  BP: (!) 140/78  Pulse: 62  Weight: 77.3 kg  Height: 5' 1.5" (1.562 m)     GEN- The patient is a well appearing elderly female, alert and oriented x 3 today.   HEENT-head normocephalic, atraumatic, sclera clear, conjunctiva pink, hearing intact, trachea midline. Lungs- Clear to ausculation bilaterally, normal work of breathing Heart- Regular rate and rhythm, no murmurs, rubs or gallops  GI- soft, NT, ND, + BS Extremities- no clubbing, cyanosis, or edema MS- no significant deformity or atrophy Skin- no rash or lesion Psych- euthymic mood, full affect Neuro- strength and sensation are intact   Wt Readings from Last 3 Encounters:  03/04/23 77.3 kg  01/29/23 74.6 kg  12/05/22 72.6 kg    EKG today demonstrates  SR, PACs Vent. rate 62  BPM PR interval 160 ms QRS duration 82 ms QT/QTcB 422/428 ms  Echo 02/15/21 demonstrated  1. Left ventricular ejection fraction, by estimation, is 45 to 50%. The  left ventricle has mildly decreased function. Left ventricular endocardial  border not optimally defined to evaluate regional wall motion. Basal  segments are hypokinetic with best preserved function in mid and apex. Left ventricular diastolic parameters are indeterminate.   2. Right ventricular systolic function is mildly reduced. The right  ventricular size is normal. Tricuspid regurgitation signal is inadequate  for assessing PA pressure.   3. Left atrial size was mildly dilated.   4. Right atrial size was mildly dilated.   5. The mitral valve is normal in structure. Trivial mitral valve  regurgitation. No evidence of mitral stenosis.   6. The aortic valve is grossly normal. There is mild calcification of the  aortic valve. Aortic valve regurgitation is not visualized. No aortic  stenosis is present.   7. The inferior vena cava is normal in size with greater than 50%  respiratory variability, suggesting right atrial pressure of 3 mmHg.   Epic records are reviewed at length today   CHA2DS2-VASc Score = 5  The patient's score is based upon: CHF History: 0 HTN History: 1 Diabetes History: 0 Stroke History: 0 Vascular Disease History: 1 Age Score: 2 Gender Score: 1        ASSESSMENT AND PLAN: 1. Persistent Atrial Fibrillation (ICD10:  I48.19) The patient's CHA2DS2-VASc score is 5, indicating a 7.2% annual risk of stroke.   Event monitor showed total of 1 hour of rate controlled afib.  Patient is happy with her present therapy.  Continue atenolol 50 mg AM and 50 mg PM Continue diltiazem 60 mg AM and 60 mg PM, has trouble swallowing the capsule  2. Secondary Hypercoagulable State (ICD10:  D68.69) The patient is at significant risk for stroke/thromboembolism based upon  her CHA2DS2-VASc Score of 5.  Continue  Apixaban (Eliquis).   3. HTN Stable, no changes today.  4. Obstructive sleep apnea Encouraged compliance with CPAP therapy.  5. CAD Non obstructive by LHC 2012 CAC score 41 No anginal symptoms.   Follow up in the AF clinic in 6 months.    [facility] [address] [place], [place] [address] [phone]

## 2023-03-11 ENCOUNTER — Other Ambulatory Visit: Payer: Self-pay | Admitting: Gastroenterology

## 2023-03-12 ENCOUNTER — Telehealth: Payer: Self-pay | Admitting: *Deleted

## 2023-03-12 NOTE — Telephone Encounter (Signed)
   Pre-operative Risk Assessment    Patient Name: Tabitha Murphy  DOB: July 28, 1941 MRN: [id number]      Request for Surgical Clearance    Procedure:   ENDOSCOPY  WITH DILATATION  Date of Surgery:  Clearance 03/24/23                                 Surgeon:  DR. [facility] Surgeon's Group or Practice Name:  [facility] Phone number:  [phone] Fax number:  [phone]   Type of Clearance Requested:   - Medical  - Pharmacy:  Hold Apixaban (Eliquis)     Type of Anesthesia:  Not Indicated (PROPOFOL?)   Additional requests/questions:    Elpidio Anis   03/12/2023, 4:01 PM

## 2023-03-13 NOTE — Telephone Encounter (Signed)
   Name: JAYANNA EVERY  DOB: 20-May-1941  MRN: [id number]   Primary Cardiologist: Armanda Magic, MD  Chart reviewed as part of pre-operative protocol coverage. YECICA BOVE was last seen on 12/05/2022 by Dr. Mayford Knife with complaints of chest heaviness and shortness of breath.  She had a normal low risk nuclear stress test.  Coronary CTA showed minimal mixed nonobstructive CAD.  Per correspondence with Dr. Mayford Knife: "Patient is low risk for upper endoscopy with dilatation."  Therefore, based on ACC/AHA guidelines, the patient would be at acceptable risk for the planned procedure without further cardiovascular testing.   Per Pharm.D.: Patient with diagnosis of afib on Eliquis for anticoagulation.     Procedure: endoscopy Date of procedure: 03/24/23   CHA2DS2-VASc Score = 5  This indicates a 7.2% annual risk of stroke. The patient's score is based upon: CHF History: 0 HTN History: 1 Diabetes History: 0 Stroke History: 0 Vascular Disease History: 1 Age Score: 2 Gender Score: 1   CrCl 18mL/min using adj body weight Platelet count 328K   Per office protocol, patient can hold Eliquis for 1-2 days prior to procedure.    I will route this recommendation to the requesting party via Epic fax function and remove from pre-op pool. Please call with questions.  Carlos Levering, NP 03/13/2023, 11:30 AM

## 2023-03-13 NOTE — Telephone Encounter (Signed)
Patient with diagnosis of afib on Eliquis for anticoagulation.    Procedure: endoscopy Date of procedure: 03/24/23  CHA2DS2-VASc Score = 5  This indicates a 7.2% annual risk of stroke. The patient's score is based upon: CHF History: 0 HTN History: 1 Diabetes History: 0 Stroke History: 0 Vascular Disease History: 1 Age Score: 2 Gender Score: 1  CrCl 35mL/min using adj body weight Platelet count 328K  Per office protocol, patient can hold Eliquis for 1-2 days prior to procedure.    **This guidance is not considered finalized until pre-operative APP has relayed final recommendations.**

## 2023-03-13 NOTE — Telephone Encounter (Signed)
Dr. Mayford Knife,  You saw this patient on 12/05/2022.  She had a normal low risk nuclear stress test.  Coronary CTA showed minimal mixed nonobstructive CAD with a calcium score of 41.7.  It was suggestive of pulmonary hypertension with dilated main pulmonary artery 34 mm.  Will you please comment on medical clearance for EGD with dilatation scheduled for 03/24/2023?  Please route your response to P CV DIV Preop. I will communicate with requesting office once you have given recommendations.   Thank you!  Carlos Levering, NP

## 2023-03-18 ENCOUNTER — Encounter (HOSPITAL_COMMUNITY): Payer: Self-pay | Admitting: Gastroenterology

## 2023-03-18 NOTE — Progress Notes (Signed)
Attempted to obtain medical history via telephone, unable to reach at this time. HIPAA compliant voicemail message left requesting return call to pre surgical testing department. 

## 2023-03-20 DIAGNOSIS — J45909 Unspecified asthma, uncomplicated: Secondary | ICD-10-CM | POA: Diagnosis not present

## 2023-03-20 DIAGNOSIS — I10 Essential (primary) hypertension: Secondary | ICD-10-CM | POA: Diagnosis not present

## 2023-03-23 NOTE — H&P (Signed)
History of Present Illness General:         82 year old female referred for dysphagia.        EGD 12/04/2021 for early satiety, weight loss and suspected GERD showed normal esophagus, regular Z-line, no H. pylori and no celiac on biopsies        Colonoscopy, screening 12/04/2021: 1 tubular adenoma removed hyperplastic polyp with mucosal features, repeat in 5 years if general condition allows, sigmoid diverticulosis        Labs with PCP 12/22/2022: Hemoglobin 12.6, MCV 93.6, platelet 359, normal ferritin, unremarkable iron panel, labs from 12/03/2022 showed blood sugar 125, normal renal function, TSH unremarkable in 10/2022        Ultrasound thyroid 11/26/2022 showed bilateral thyroid nodules and large left thyroid nodule, no new suspicious thyroid nodules        She reports trouble swallowing for years, it used to be rare but now it is every single time she swallows a pill like ASA, she chokes easily, lives at home by herself except on weekends when her son is with her.        SHe has not toruble with food, except occasional choking, she does not restrict any kind of food, occasionally water goes down the wrong way.        SHe has not lost any weight,she has a good appetite,lately she feels full fast,denies nausea or vomiting, SHe takes 1/2 pill of prilosec 20 mg with good controf acid reflux or heartburn.        She has regular BMs, mostly daily, she drinks a tea and it helps if she has skipped a day without a BM, denies blood in stool, stools can be dark but not black.        Denies abdominal pain.  Current Medications Taking Cyclobenzaprine HCl 10 MG Tablet 1/2 -1 TABLET AT BEDTIME AS NEEDED FOR MUSCLE SPASMS ORALLY ONCE A DAY 30 DAYS Dilt-XR(dilTIAZem HCl ER) 120 MG Capsule Extended Release 24 Hour 1 CAPSULE BY MOUTH EVERY MORNING 90 DAYS Furosemide 20 MG Tablet 1 tablet by mouth twice a week hydroCHLOROthiazide 25 MG Tablet TAKE 1 TABLET BY MOUTH EVERY DAY IN THE MORNING Irbesartan 300 MG Tablet 1  TABLET BY MOUTH EVERY MORNING 90 DAYS Promethazine HCl 25 MG Tablet 1 TABLET BY MOUTH EVERY 12 HRS AS NEEDED 15 DAYS As needed Scopolamine 1 MG/3DAYS Patch 72 Hour APPLY 1 PATCH TO SKIN BEHIND THE EAR AS NEEDED *CHANGE EVERY 72 HOURS AS NEEDED* As needed Meclizine HCl 25 MG Tablet 1 tablet Orally every 8 hours as needed for dizziness Eliquis(Apixaban) 5 MG Tablet 1 tablet by mouth Twice a day HYDROcodone-Acetaminophen 5-325 MG Tablet 1 capsule by mouth at bedtime, as needed for pain , Notes to Pharmacist: as needed Levothyroxine Sodium 25 MCG Tablet 1 tablet by mouth every morning on an empty stomach Albuterol Sulfate HFA 108 (90 Base) MCG/ACT Aerosol Solution 1-2 puffs Inhalation every 4 hrs, as needed for wheezing Atenolol 100 MG Tablet TAKE 1 TABLET BY MOUTH EVERY DAY IN THE MORNING PriLOSEC OTC(Omeprazole Magnesium) 20 MG Tablet Delayed Release 1/2 tablet by mouth Once a day Rosuvastatin Calcium 5 MG Tablet 1 tablet by mouth Once a day - Cardiology Tylenol Extra Strength(Acetaminophen) 500 MG Tablet 1 tablet by mouth every 6 hrs, as needed , Notes to Pharmacist: Occasional use Tylenol PM Extra Strength(diphenhydrAMINE-APAP (sleep)) 500-25 MG Tablet 1 tablet at bedtime as needed Orally Once a day , Notes to Pharmacist: Occasional use valACYclovir HCl 1 GM  Tablet 2 tablets by mouth every 12 hrs for one day, per outbreak of fever blister. Patient advised to always keep this medicine with her and to begin treatment immediately had onset of symptoms , Notes to Pharmacist: as needed Xanax(ALPRAZolam) 0.5 MG Tablet 1 tablet by mouth once a day, if needed for anxiety Triamcinolone Acetonide 0.1 % Cream apply sparingly to rash Externally Twice a day, as needed , Notes to Pharmacist: as needed Supplement: . Marland Kitchen Precious Haws- for foot Spray Every night , Notes to Pharmacist: as needed One Daily Womens 50+(Multiple Vitamins-Minerals) - Tablet 1 tablet by mouth once a day Calcium + D 600-200 MG-UNIT Tablet 1  tablet with food by mouth Twice a day B-50 Complex(B Complex-Biotin-FA) - Tablet 1 tablet by mouth once a day Biotin 16109 MCG Tablet Disintegrating 1 tablet by mouth Once a day Selenium 100 MCG Capsule 1 capsule by mouth Once a day Vitamin C 100 MG Tablet Chewable 1 tablet by mouth Once a day Lidocaine-Prilocaine 2.5-2.5 % Cream as directed Externally , Notes to Pharmacist: as needed Miconazole Antifungal(Miconazole Nitrate) 2 % Cream 1 application Externally Twice a day to the affected area , Notes to Pharmacist: as needed Prevagen Extra Strength(Apoaequorin) 20 MG Capsule as directed Orally 1 capsule once a day Medication List reviewed and reconciled with the patient Past Medical History      hypertension-[facility].      Hypothyroidism.      Hypercholesterolemia.      History of Chronic kidney disease (CKD), stage III (moderate).      GERD.      Hiatal hernia.      Dysphagia.      Obstructive sleep apnea (PSG 06/14/15 ESS 1, AHI 7/hr REM 38/hr, RDI 7/hr REM 38/hr, O2 min 71%; CPAP recommended due to hypoxemia).      Back pain- injections with Dr. Ollen Bowl.      Anxiety.      Mild depression.      Gastroesophageal reflux.      Bursitis of right shoulder.      Insomnia.      Hypothyroidism.      Thyroid nodule.      Grieving.      Chronic low back pain.      Pure hypercholesterolemia.      Major depression in partial remission.      Trigger finger of right hand.      Peripheral neuropathy.      Gastroesophageal reflux disease without esophagitis.      OSA (obstructive sleep apnea).      Obstructive sleep apnea (adult) (pediatric).      Essential hypertension.      Diphtheria - age 10.      Atrial fibrillation.      PT due to fall/concussion, dx with BPPV, 08/2022. Surgical History       cholecystectomy       colon 12/25/03, 12/2021       endo 11/09/98, 12/2021 Family History Father: deceased, 2nd myocardial infarction at age 14 and passed away, diagnosed with Coronary artery  disease Mother: deceased, ovarian or uterine cancer at age 7, myocardial infarction at 18, diagnosed with Ovarian cancer Paternal Grand Father: deceased Paternal Grand Mother: deceased, diagnosed with Diabetes Maternal Grand Father: deceased, diagnosed with CVA Maternal Grand Mother: deceased, diagnosed with CVA Brother 1: deceased, myocardial infarction Brother2: deceased, myocardial infarction Brother 3: deceased, myocardial infarction, and 4th brother myocardial infarction at age 61 Sister 1: deceased, diabetes type II, hypertension Sister  2: alive, diagnosed with Diabetes, Hypertension Sister 3: alive, hypertension, 4th sister hypertension , myocardial infarction at age 93 and died, diagnosed with Hypertension 4 brother(s) , 4 sister(s) . 2 son(s) , 1 daughter(s) . Social History General:  Tobacco use      cigarettes:  Never smoked     Tobacco history last updated  01/12/2023     Vaping  No EXPOSURE TO PASSIVE SMOKE: no. Alcohol: never. Recreational drug use: never. Exercise: 5+ days per week. Marital Status: widowed. Children: 2 sons and 1 daughter. OCCUPATION: retired. Religion: [employer]. Seat belt use: yes, always. Tobacco Exposure: none. Allergies Codeine Sulfate: nausea and vomiting-Can take if given Promethazine with it - Side Effects Zolpidem Tartrate: retrograde amnesia - Side Effects Crest: skin around mouth comes off - Side Effects [employer] Pl[employer]: skin peeling - Side Effects Duloxetine HCl: Heaviness and pains in chest and back/dry mouth - Side Effects Amlodipine Besylate: swelling along with aches and pains - Side Effects Lyrica: hair falling out - Side Effects Gabapentin: causes aches and pains - Side Effects Doxazosin Mesylate: SOB - Side Effects Meclizine HCl: did not help - Lack of Therapeutic Effect Hospitalization/Major Diagnostic Procedure childbirth a fib 03/2021 not in the past year 02/24 Review of Systems GI PROCEDURE:         Pacemaker/ AICD  no.  Artificial heart valves no.  MI/heart attack no.  Abnormal heart rhythm YES, Atrial Fibrillation.  Angina no.  CVA no.  Hypertension YES.  Hypotension no.  Asthma, COPD no.  Sleep apnea no.  Seizure disorders no.  Artificial joints no.  Severe DJD no.  Diabetes no.  Significant headaches no.  Vertigo no.  Depression/anxiety YES.  Abnormal bleeding no.  Kidney Disease no.  Liver disease no.  Chance of pregnancy no.  Blood transfusion no.   Vital Signs Wt: 159.8, Wt change: 0.8 lbs, Ht: 61, BMI: 30.19, Temp: 98.1, Pulse sitting: 82, BP sitting: 154/89 per pt did not take bp medication caurse she cn not swallow the pill deny second bp. Examination Gastroenterology::        GENERAL APPEARANCE: Well developed, well nourished, no active distress, pleasant.         SCLERA: anicteric.         CARDIOVASCULAR Normal RRR .         RESPIRATORY Breath sounds normal. Respiration even and unlabored.         ABDOMEN No masses palpated. Liver and spleen not palpated, normal. Bowel sounds normal, Abdomen not distended.         EXTREMITIES: No edema.         NEURO: alert, oriented to time, place and person, normal gait.         PSYCH: mood/affect normal.  Assessments 1. Dysphagia, oropharyngeal - R13.12 (Primary) Treatment 1. Dysphagia, oropharyngeal   Barium swallow 03/02/23 showed Mild-to-moderate, smoothly-tapered stricture in the proximal cervical esophagus at the level of C5.   Moderate esophageal dysmotility.  Recommend diagnostic EGD with balloon dilation at [facility].

## 2023-03-24 ENCOUNTER — Encounter (HOSPITAL_COMMUNITY): Payer: Self-pay | Admitting: Gastroenterology

## 2023-03-24 ENCOUNTER — Ambulatory Visit (HOSPITAL_COMMUNITY)
Admission: RE | Admit: 2023-03-24 | Discharge: 2023-03-24 | Disposition: A | Discharge status: Home / Self Care | Payer: PPO | Attending: Gastroenterology | Admitting: Gastroenterology

## 2023-03-24 ENCOUNTER — Ambulatory Visit (HOSPITAL_BASED_OUTPATIENT_CLINIC_OR_DEPARTMENT_OTHER): Payer: PPO | Admitting: Certified Registered Nurse Anesthetist

## 2023-03-24 ENCOUNTER — Other Ambulatory Visit: Payer: Self-pay

## 2023-03-24 ENCOUNTER — Ambulatory Visit (HOSPITAL_COMMUNITY): Payer: PPO | Admitting: Certified Registered Nurse Anesthetist

## 2023-03-24 ENCOUNTER — Encounter (HOSPITAL_COMMUNITY)
Admission: RE | Disposition: A | Discharge status: Home / Self Care | Payer: Self-pay | Source: Home / Self Care | Attending: Gastroenterology

## 2023-03-24 DIAGNOSIS — R1310 Dysphagia, unspecified: Secondary | ICD-10-CM | POA: Diagnosis not present

## 2023-03-24 DIAGNOSIS — K449 Diaphragmatic hernia without obstruction or gangrene: Secondary | ICD-10-CM | POA: Diagnosis not present

## 2023-03-24 DIAGNOSIS — K219 Gastro-esophageal reflux disease without esophagitis: Secondary | ICD-10-CM | POA: Diagnosis not present

## 2023-03-24 DIAGNOSIS — I4891 Unspecified atrial fibrillation: Secondary | ICD-10-CM

## 2023-03-24 DIAGNOSIS — I10 Essential (primary) hypertension: Secondary | ICD-10-CM

## 2023-03-24 DIAGNOSIS — E042 Nontoxic multinodular goiter: Secondary | ICD-10-CM | POA: Diagnosis not present

## 2023-03-24 DIAGNOSIS — K222 Esophageal obstruction: Secondary | ICD-10-CM | POA: Diagnosis not present

## 2023-03-24 DIAGNOSIS — Z79899 Other long term (current) drug therapy: Secondary | ICD-10-CM | POA: Insufficient documentation

## 2023-03-24 DIAGNOSIS — R1312 Dysphagia, oropharyngeal phase: Secondary | ICD-10-CM | POA: Insufficient documentation

## 2023-03-24 DIAGNOSIS — N183 Chronic kidney disease, stage 3 unspecified: Secondary | ICD-10-CM | POA: Insufficient documentation

## 2023-03-24 DIAGNOSIS — I129 Hypertensive chronic kidney disease with stage 1 through stage 4 chronic kidney disease, or unspecified chronic kidney disease: Secondary | ICD-10-CM | POA: Diagnosis not present

## 2023-03-24 DIAGNOSIS — K3189 Other diseases of stomach and duodenum: Secondary | ICD-10-CM | POA: Diagnosis not present

## 2023-03-24 DIAGNOSIS — R933 Abnormal findings on diagnostic imaging of other parts of digestive tract: Secondary | ICD-10-CM | POA: Diagnosis not present

## 2023-03-24 HISTORY — PX: BALLOON DILATION: SHX5330

## 2023-03-24 HISTORY — PX: ESOPHAGOGASTRODUODENOSCOPY (EGD) WITH PROPOFOL: SHX5813

## 2023-03-24 SURGERY — ESOPHAGOGASTRODUODENOSCOPY (EGD) WITH PROPOFOL
Anesthesia: Monitor Anesthesia Care

## 2023-03-24 MED ORDER — PROPOFOL 500 MG/50ML IV EMUL
INTRAVENOUS | Status: AC
  Filled 2023-03-24: qty 50

## 2023-03-24 MED ORDER — LIDOCAINE 2% (20 MG/ML) 5 ML SYRINGE
INTRAMUSCULAR | Status: DC | PRN
  Administered 2023-03-24: 40 mg via INTRAVENOUS

## 2023-03-24 MED ORDER — LACTATED RINGERS IV SOLN
INTRAVENOUS | Status: DC
  Administered 2023-03-24: 1000 mL via INTRAVENOUS

## 2023-03-24 MED ORDER — PROPOFOL 10 MG/ML IV BOLUS
INTRAVENOUS | Status: DC | PRN
  Administered 2023-03-24: 30 mg via INTRAVENOUS

## 2023-03-24 MED ORDER — PROPOFOL 500 MG/50ML IV EMUL
INTRAVENOUS | Status: DC | PRN
  Administered 2023-03-24: 125 ug/kg/min via INTRAVENOUS

## 2023-03-24 MED ORDER — ONDANSETRON HCL 4 MG/2ML IJ SOLN
INTRAMUSCULAR | Status: DC | PRN
  Administered 2023-03-24: 4 mg via INTRAVENOUS

## 2023-03-24 SURGICAL SUPPLY — 15 items

## 2023-03-24 NOTE — Op Note (Signed)
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 Estimated blood loss:                            Minimal. Estimated Blood Loss:     Estimated blood loss was minimal. Procedure:                Pre-Anesthesia Assessment:                           - Prior to the procedure, a History and Physical                            was performed, and patient medications and                            allergies were reviewed. The patient's tolerance of                            previous anesthesia was also reviewed. The risks                            and benefits of the procedure and the sedation                            options and risks were discussed with the patient.                            All questions were answered, and informed consent                            was obtained. Prior Anticoagulants: The patient has  taken Eliquis (apixaban), last dose was 2 days                            prior to procedure. ASA Grade Assessment: III - A                            patient with severe systemic disease. After                            reviewing the risks and benefits, the patient was                            deemed in satisfactory condition to undergo the                             procedure.                           After obtaining informed consent, the endoscope was                            passed under direct vision. Throughout the                            procedure, the patient's blood pressure, pulse, and                            oxygen saturations were monitored continuously. The                            [id number]-H190 ([id number]) Olympus endoscope was introduced                            through the mouth, and advanced to the second part                            of duodenum. The upper GI endoscopy was                            accomplished without difficulty. The patient                            tolerated the procedure well. Scope In: Scope Out: Findings:      No endoscopic abnormality was evident in the esophagus to explain the       patient's complaint of dysphagia. It was decided, however, to proceed       with dilation in the proximal esophagus. A TTS dilator was passed       through the scope. Dilation with an 18-19-20 mm balloon dilator was       performed to 20 mm. The dilation site was examined following endoscope       reinsertion and showed moderate improvement in luminal narrowing.       Estimated blood loss was minimal.      Diffuse mildly erythematous mucosa without bleeding was found  in the       stomach.      The exam was otherwise without abnormality.      The cardia and gastric fundus were normal on retroflexion.      The examined duodenum was normal. Impression:               - No endoscopic esophageal abnormality to explain                            patient's dysphagia. Esophagus dilated. Dilated.                           - Erythematous mucosa in the stomach.                           - The examination was otherwise normal.                           - Normal examined duodenum.                           - No specimens collected. Moderate Sedation:      Patient did not receive moderate sedation for this procedure, but        instead received monitored anesthesia care. Recommendation:           - Patient has a contact number available for                            emergencies. The signs and symptoms of potential                            delayed complications were discussed with the                            patient. Return to normal activities tomorrow.                            Written discharge instructions were provided to the                            patient.                           - Advance diet as tolerated.                           - Continue present medications.                           - Resume Eliquis (apixaban) at prior dose tomorrow. Procedure Code(s):        --- Professional ---                           [id number], Esophagogastroduodenoscopy, flexible,                            transoral; with transendoscopic balloon dilation  of                            esophagus (less than 30 mm diameter) Diagnosis Code(s):        --- Professional ---                           R13.10, Dysphagia, unspecified                           K31.89, Other diseases of stomach and duodenum                           R93.3, Abnormal findings on diagnostic imaging of                            other parts of digestive tract CPT copyright 2022 [employer]. All rights reserved. The codes documented in this report are preliminary and upon coder review may  be revised to meet current compliance requirements. Kerin Salen, MD 03/24/2023 1:13:39 PM This report has been signed electronically. Number of Addenda: 0

## 2023-03-24 NOTE — Discharge Instructions (Signed)
YOU HAD AN ENDOSCOPIC PROCEDURE TODAY: Refer to the procedure report and other information in the discharge instructions given to you for any specific questions about what was found during the examination. If this information does not answer your questions, please call [facility] office at [phone] to clarify.   YOU SHOULD EXPECT: Some feelings of bloating in the abdomen. Passage of more gas than usual. Walking can help get rid of the air that was put into your GI tract during the procedure and reduce the bloating. If you had a lower endoscopy (such as a colonoscopy or flexible sigmoidoscopy) you may notice spotting of blood in your stool or on the toilet paper. Some abdominal soreness may be present for a day or two, also.  DIET: Your first meal following the procedure should be a light meal and then it is ok to progress to your normal diet. A half-sandwich or bowl of soup is an example of a good first meal. Heavy or fried foods are harder to digest and may make you feel nauseous or bloated. Drink plenty of fluids but you should avoid alcoholic beverages for 24 hours. If you had a esophageal dilation, please see attached instructions for diet.    ACTIVITY: Your care partner should take you home directly after the procedure. You should plan to take it easy, moving slowly for the rest of the day. You can resume normal activity the day after the procedure however YOU SHOULD NOT DRIVE, use power tools, machinery or perform tasks that involve climbing or major physical exertion for 24 hours (because of the sedation medicines used during the test).   SYMPTOMS TO REPORT IMMEDIATELY: A gastroenterologist can be reached at any hour. Please call [phone]  for any of the following symptoms:  Following upper endoscopy (EGD, esophageal dilation) Vomiting of blood or coffee ground material  New, significant abdominal pain  New, significant chest pain or pain under the shoulder blades  Painful or persistently  difficult swallowing  New shortness of breath  Black, tarry-looking or red, bloody stools  FOLLOW UP:  If any biopsies were taken you will be contacted by phone or by letter within the next 1-3 weeks. Call [phone]  if you have not heard about the biopsies in 3 weeks.  Please also call with any specific questions about appointments or follow up tests. YOU HAD AN ENDOSCOPIC PROCEDURE TODAY: Refer to the procedure report and other information in the discharge instructions given to you for any specific questions about what was found during the examination. If this information does not answer your questions, please call [facility] office at [phone] to clarify.

## 2023-03-24 NOTE — Transfer of Care (Signed)
Immediate Anesthesia Transfer of Care Note  Patient: Tabitha Murphy  Procedure(s) Performed: ESOPHAGOGASTRODUODENOSCOPY (EGD) WITH PROPOFOL BALLOON DILATION  Patient Location: PACU  Anesthesia Type:MAC  Level of Consciousness: awake, alert , and oriented  Airway & Oxygen Therapy: Patient Spontanous Breathing and Patient connected to face mask oxygen  Post-op Assessment: Report given to RN and Post -op Vital signs reviewed and stable  Post vital signs: Reviewed and stable  Last Vitals:  Vitals Value Taken Time  BP    Temp    Pulse    Resp    SpO2      Last Pain:  Vitals:   03/24/23 1151  TempSrc: Temporal  PainSc: 0-No pain         Complications: No notable events documented.

## 2023-03-24 NOTE — Anesthesia Preprocedure Evaluation (Signed)
Anesthesia Evaluation  Patient identified by MRN, date of birth, ID band Patient awake    Reviewed: Allergy & Precautions, NPO status , Patient's Chart, lab work & pertinent test results  Airway Mallampati: II  TM Distance: >3 FB Neck ROM: Full    Dental  (+) Dental Advisory Given   Pulmonary neg pulmonary ROS   breath sounds clear to auscultation       Cardiovascular hypertension, Pt. on medications and Pt. on home beta blockers + dysrhythmias Atrial Fibrillation  Rhythm:Regular Rate:Normal     Neuro/Psych  Neuromuscular disease    GI/Hepatic Neg liver ROS, hiatal hernia,,,  Endo/Other  negative endocrine ROS    Renal/GU negative Renal ROS     Musculoskeletal   Abdominal   Peds  Hematology negative hematology ROS (+)   Anesthesia Other Findings   Reproductive/Obstetrics                             Anesthesia Physical Anesthesia Plan  ASA: 3  Anesthesia Plan: MAC   Post-op Pain Management:    Induction:   PONV Risk Score and Plan: 1 and 2 and Propofol infusion, Ondansetron and Treatment may vary due to age or medical condition  Airway Management Planned: Natural Airway and Nasal Cannula  Additional Equipment:   Intra-op Plan:   Post-operative Plan:   Informed Consent: I have reviewed the patients History and Physical, chart, labs and discussed the procedure including the risks, benefits and alternatives for the proposed anesthesia with the patient or authorized representative who has indicated his/her understanding and acceptance.       Plan Discussed with:   Anesthesia Plan Comments:        Anesthesia Quick Evaluation

## 2023-03-25 NOTE — Anesthesia Postprocedure Evaluation (Signed)
Anesthesia Post Note  Patient: Tabitha Murphy  Procedure(s) Performed: ESOPHAGOGASTRODUODENOSCOPY (EGD) WITH PROPOFOL BALLOON DILATION     Patient location during evaluation: PACU Anesthesia Type: MAC Level of consciousness: awake and alert Pain management: pain level controlled Vital Signs Assessment: post-procedure vital signs reviewed and stable Respiratory status: spontaneous breathing, nonlabored ventilation, respiratory function stable and patient connected to nasal cannula oxygen Cardiovascular status: stable and blood pressure returned to baseline Postop Assessment: no apparent nausea or vomiting Anesthetic complications: no   No notable events documented.  Last Vitals:  Vitals:   03/24/23 1330 03/24/23 1335  BP: 130/84 (!) 150/91  Pulse: (!) 57 (!) 55  Resp: 14 14  Temp:    SpO2: 98% 97%    Last Pain:  Vitals:   03/24/23 1548  TempSrc:   PainSc: 6                  Kennieth Rad

## 2023-03-29 ENCOUNTER — Encounter (HOSPITAL_COMMUNITY): Payer: Self-pay | Admitting: Gastroenterology

## 2023-04-01 DIAGNOSIS — G4733 Obstructive sleep apnea (adult) (pediatric): Secondary | ICD-10-CM | POA: Diagnosis not present

## 2023-04-01 DIAGNOSIS — M545 Low back pain, unspecified: Secondary | ICD-10-CM | POA: Diagnosis not present

## 2023-04-13 DIAGNOSIS — R112 Nausea with vomiting, unspecified: Secondary | ICD-10-CM | POA: Diagnosis not present

## 2023-04-17 DIAGNOSIS — R42 Dizziness and giddiness: Secondary | ICD-10-CM | POA: Diagnosis not present

## 2023-04-17 DIAGNOSIS — R531 Weakness: Secondary | ICD-10-CM | POA: Diagnosis not present

## 2023-04-17 DIAGNOSIS — R399 Unspecified symptoms and signs involving the genitourinary system: Secondary | ICD-10-CM | POA: Diagnosis not present

## 2023-04-17 DIAGNOSIS — I4891 Unspecified atrial fibrillation: Secondary | ICD-10-CM | POA: Diagnosis not present

## 2023-04-17 DIAGNOSIS — N390 Urinary tract infection, site not specified: Secondary | ICD-10-CM | POA: Diagnosis not present

## 2023-05-02 DIAGNOSIS — M545 Low back pain, unspecified: Secondary | ICD-10-CM | POA: Diagnosis not present

## 2023-05-02 DIAGNOSIS — G4733 Obstructive sleep apnea (adult) (pediatric): Secondary | ICD-10-CM | POA: Diagnosis not present

## 2023-05-04 ENCOUNTER — Telehealth: Payer: Self-pay | Admitting: Cardiology

## 2023-05-04 NOTE — Telephone Encounter (Signed)
Called patient back about message. Patient stated she usually has ankle and leg swelling when she goes to the beach. Patient stated she took furosemide once when she was at the beach and now that she has returned home she has not taken another dose. Encouraged patient to take her furosemide for a couple of days to see if that helps, and call our office if it does not help the swelling. Encourage patient to elevate her legs and to reduce her salt in her diet. Patient stated that her body does not retain salt. Will forward to Dr. Mayford Knife and her nurse for further advisement.

## 2023-05-04 NOTE — Telephone Encounter (Signed)
Per Dr. Mayford Knife, Agree with taking Lasix 20mg  daily for 5 days and then PRN - if swelling does not resolve then call our office. Called patient with recommendations. Patient verbalized understanding.

## 2023-05-04 NOTE — Telephone Encounter (Signed)
Pt c/o swelling: STAT is pt has developed SOB within 24 hours  If swelling, where is the swelling located? Legs and ankles  How much weight have you gained and in what time span? NA  Have you gained 3 pounds in a day or 5 pounds in a week? No  Do you have a log of your daily weights (if so, list)? No  Are you currently taking a fluid pill? Yes  Are you currently SOB? No  Have you traveled recently? [address]

## 2023-05-05 ENCOUNTER — Telehealth: Payer: Self-pay | Admitting: Cardiology

## 2023-05-05 DIAGNOSIS — E871 Hypo-osmolality and hyponatremia: Secondary | ICD-10-CM | POA: Diagnosis not present

## 2023-05-05 DIAGNOSIS — D649 Anemia, unspecified: Secondary | ICD-10-CM | POA: Diagnosis not present

## 2023-05-05 MED ORDER — DILTIAZEM HCL 60 MG PO TABS: 60.0000 mg | ORAL_TABLET | Freq: Two times a day (BID) | ORAL | 2 refills | Status: DC

## 2023-05-05 NOTE — Telephone Encounter (Signed)
*  STAT* If patient is at the pharmacy, call can be transferred to refill team.   1. Which medications need to be refilled? (please list name of each medication and dose if known) diltiazem (CARDIZEM) 60 MG tablet  2. Which pharmacy/location (including street and city if local pharmacy) is medication to be sent to? [id number] - [place], [place] - [address]  3. Do they need a 30 day or 90 day supply?  90 day supply + 3 refills   Patient states she is almost out of medication.

## 2023-05-22 DIAGNOSIS — M545 Low back pain, unspecified: Secondary | ICD-10-CM | POA: Diagnosis not present

## 2023-05-22 DIAGNOSIS — M79601 Pain in right arm: Secondary | ICD-10-CM | POA: Diagnosis not present

## 2023-05-22 DIAGNOSIS — R109 Unspecified abdominal pain: Secondary | ICD-10-CM | POA: Diagnosis not present

## 2023-05-22 DIAGNOSIS — R1031 Right lower quadrant pain: Secondary | ICD-10-CM | POA: Diagnosis not present

## 2023-05-26 ENCOUNTER — Other Ambulatory Visit: Payer: Self-pay | Admitting: Cardiology

## 2023-05-26 DIAGNOSIS — I10 Essential (primary) hypertension: Secondary | ICD-10-CM

## 2023-05-27 DIAGNOSIS — Z79899 Other long term (current) drug therapy: Secondary | ICD-10-CM | POA: Diagnosis not present

## 2023-05-27 DIAGNOSIS — E039 Hypothyroidism, unspecified: Secondary | ICD-10-CM | POA: Diagnosis not present

## 2023-05-27 DIAGNOSIS — I10 Essential (primary) hypertension: Secondary | ICD-10-CM | POA: Diagnosis not present

## 2023-05-27 DIAGNOSIS — G4733 Obstructive sleep apnea (adult) (pediatric): Secondary | ICD-10-CM | POA: Diagnosis not present

## 2023-05-27 DIAGNOSIS — R809 Proteinuria, unspecified: Secondary | ICD-10-CM | POA: Diagnosis not present

## 2023-05-27 DIAGNOSIS — Z0000 Encounter for general adult medical examination without abnormal findings: Secondary | ICD-10-CM | POA: Diagnosis not present

## 2023-05-27 DIAGNOSIS — D6869 Other thrombophilia: Secondary | ICD-10-CM | POA: Diagnosis not present

## 2023-05-27 DIAGNOSIS — E7800 Pure hypercholesterolemia, unspecified: Secondary | ICD-10-CM | POA: Diagnosis not present

## 2023-05-27 DIAGNOSIS — I700 Atherosclerosis of aorta: Secondary | ICD-10-CM | POA: Diagnosis not present

## 2023-05-27 DIAGNOSIS — I4891 Unspecified atrial fibrillation: Secondary | ICD-10-CM | POA: Diagnosis not present

## 2023-05-27 DIAGNOSIS — I420 Dilated cardiomyopathy: Secondary | ICD-10-CM | POA: Diagnosis not present

## 2023-05-27 DIAGNOSIS — F419 Anxiety disorder, unspecified: Secondary | ICD-10-CM | POA: Diagnosis not present

## 2023-05-27 DIAGNOSIS — R7303 Prediabetes: Secondary | ICD-10-CM | POA: Diagnosis not present

## 2023-05-27 NOTE — Telephone Encounter (Signed)
This is a [facility] pt 

## 2023-05-28 ENCOUNTER — Other Ambulatory Visit: Payer: Self-pay | Admitting: Gastroenterology

## 2023-06-01 DIAGNOSIS — G4733 Obstructive sleep apnea (adult) (pediatric): Secondary | ICD-10-CM | POA: Diagnosis not present

## 2023-06-01 DIAGNOSIS — M545 Low back pain, unspecified: Secondary | ICD-10-CM | POA: Diagnosis not present

## 2023-06-11 DIAGNOSIS — M13832 Other specified arthritis, left wrist: Secondary | ICD-10-CM | POA: Diagnosis not present

## 2023-06-25 ENCOUNTER — Other Ambulatory Visit: Payer: Self-pay | Admitting: Family

## 2023-07-02 DIAGNOSIS — M545 Low back pain, unspecified: Secondary | ICD-10-CM | POA: Diagnosis not present

## 2023-07-02 DIAGNOSIS — M5416 Radiculopathy, lumbar region: Secondary | ICD-10-CM | POA: Diagnosis not present

## 2023-07-02 DIAGNOSIS — G4733 Obstructive sleep apnea (adult) (pediatric): Secondary | ICD-10-CM | POA: Diagnosis not present

## 2023-07-16 ENCOUNTER — Telehealth: Payer: Self-pay | Admitting: Cardiology

## 2023-07-16 NOTE — Telephone Encounter (Signed)
STAT if HR is under 50 or over 120 (normal HR is 60-100 beats per minute)  What is your heart rate? 40 last night, now it is 62  Do you have a log of your heart rate readings (document readings)?   Do you have any other symptoms? , no energy- patient wanted an appointment- I made her one for 07-21-23 with [name]

## 2023-07-16 NOTE — Telephone Encounter (Signed)
Attempted to call the pt but no answer and no voicemail on her home number... and left a message on her home number to call our office back.

## 2023-07-21 ENCOUNTER — Ambulatory Visit (HOSPITAL_BASED_OUTPATIENT_CLINIC_OR_DEPARTMENT_OTHER): Payer: PPO | Admitting: Family

## 2023-07-21 ENCOUNTER — Encounter (HOSPITAL_BASED_OUTPATIENT_CLINIC_OR_DEPARTMENT_OTHER): Payer: Self-pay | Admitting: Family

## 2023-07-21 VITALS — BP 128/78 | HR 54 | Ht 61.5 in | Wt 162.0 lb

## 2023-07-21 DIAGNOSIS — E782 Mixed hyperlipidemia: Secondary | ICD-10-CM

## 2023-07-21 DIAGNOSIS — I25118 Atherosclerotic heart disease of native coronary artery with other forms of angina pectoris: Secondary | ICD-10-CM

## 2023-07-21 DIAGNOSIS — D6859 Other primary thrombophilia: Secondary | ICD-10-CM | POA: Diagnosis not present

## 2023-07-21 DIAGNOSIS — I4819 Other persistent atrial fibrillation: Secondary | ICD-10-CM

## 2023-07-21 DIAGNOSIS — E785 Hyperlipidemia, unspecified: Secondary | ICD-10-CM | POA: Diagnosis not present

## 2023-07-21 DIAGNOSIS — G4733 Obstructive sleep apnea (adult) (pediatric): Secondary | ICD-10-CM | POA: Diagnosis not present

## 2023-07-21 DIAGNOSIS — I10 Essential (primary) hypertension: Secondary | ICD-10-CM | POA: Diagnosis not present

## 2023-07-21 MED ORDER — ATENOLOL 50 MG PO TABS
50.0000 mg | ORAL_TABLET | Freq: Two times a day (BID) | ORAL | 3 refills | Status: DC
Start: 2023-07-21 — End: 2024-08-16

## 2023-07-21 NOTE — Telephone Encounter (Signed)
Pt has been further evaluated by Lily Kocher this morning 08/20.  See OV note for further details.

## 2023-07-21 NOTE — Progress Notes (Signed)
Cardiology Office Note:  .   Date:  07/24/2023  ID:  Tabitha Murphy, DOB 01/24/1941, MRN [id number] PCP: Aliene Beams, MD  [facility] Providers Cardiologist:  Armanda Magic, MD    History of Present Illness: .   Tabitha Murphy is a 82 y.o. female with history of CAD, hypertension, OSA, hyperlipidemia, hypothyroidism, atrial fibrillation.  Diagnosed with atrial fibrillation 01/2021 after presenting to the ED with intermittent chest pain.  Echo LVEF 45-50% with hypokinetic basal segments.  Lexiscan with no ischemia.  She is converted spontaneously on her own in the outpatient setting.  Since that time has had intermittent episodes of atrial fibrillation.  Echo 08/2022 LVEF 55 to 60%, grade 2 diastolic dysfunction, no significant valvular abnormalities, mild dilation ascending aorta 37 mm.  She was transition from hydrochlorothiazide to Lasix 20 mg daily.  Stress test 12/2022 with no ischemia.  Coronary CTA 12/2022 with coronary calcium score 41.7 with overall minimal nonobstructive coronary artery disease.  30-day monitor 01/2023 with only 1 hour total of rate controlled atrial fibrillation which was asymptomatic.  Presents today after contacting the office noting bradycardia.  Presently on atenolol 50 mg twice daily, diltiazem 60 mg twice daily.  Notes in the evening her heart rate will be in the 40s over the last week when she is sleeping. She notes no dizziness nor lightheadedness. She notes poor energy level over the last year. About a year ago had a kitchen remodel and was able to move everything out of her kitchen but when she went to move things back in a few months later felt very fatigued. She goes to the [facility] daily (MWF arthritis class, TR water yoga) she feels good with those activities. Never more than 5 hours of sleep as it takes her a couple hours to fall asleep. She does wear her CPAP regularly without issue. Monitoring BP at home without concerns. Only needing PRN Lasix when  travelling.   ROS: Please see the history of present illness.    All other systems reviewed and are negative.   Studies Reviewed: .        Cardiac Studies & Procedures     STRESS TESTS  MYOCARDIAL PERFUSION IMAGING 12/15/2022  Narrative   The study is normal. The study is low risk.   No ST deviation was noted.   Left ventricular function is normal. Nuclear stress EF: 58 %. The left ventricular ejection fraction is normal (55-65%). End diastolic cavity size is normal.   Prior study available for comparison from 02/16/2021.   ECHOCARDIOGRAM  ECHOCARDIOGRAM COMPLETE 08/13/2022  Narrative ECHOCARDIOGRAM REPORT    Patient Name:   Tabitha Murphy Date of Exam: 08/13/2022 Medical Rec #:  [id number]      Height:       62.0 in Accession #:    [id number]     Weight:       165.0 lb Date of Birth:  1941-02-12      BSA:          1.762 m Patient Age:    81 years       BP:           128/74 mmHg Patient Gender: F              HR:           73 bpm. Exam Location:  Outpatient  Procedure: 2D Echo, 3D Echo, Color Doppler, Cardiac Doppler and Strain Analysis  Indications:    Palpitations  History:  Patient has prior history of Echocardiogram examinations, most recent 02/15/2021. CAD, Arrythmias:Atrial Fibrillation, Signs/Symptoms:Chest Pain; Risk Factors:Dyslipidemia, Non-Smoker and Hypertension. Dilated cardiomyopathy.  Sonographer:    Jeryl Columbia RDCS Referring Phys: [id number] Neiva Maenza S Briell Paulette  IMPRESSIONS   1. Left ventricular ejection fraction, by estimation, is 55 to 60%. The left ventricle has normal function. The left ventricle has no regional wall motion abnormalities. Left ventricular diastolic parameters are consistent with Grade II diastolic dysfunction (pseudonormalization). 2. Right ventricular systolic function is normal. The right ventricular size is mildly enlarged. 3. The mitral valve is normal in structure. No evidence of mitral valve regurgitation. 4. The aortic  valve is normal in structure. Aortic valve regurgitation is not visualized. 5. There is mild dilatation of the ascending aorta, measuring 37 mm. 6. The inferior vena cava is normal in size with greater than 50% respiratory variability, suggesting right atrial pressure of 3 mmHg.  Comparison(s): No significant change from prior study.  FINDINGS Left Ventricle: Left ventricular ejection fraction, by estimation, is 55 to 60%. The left ventricle has normal function. The left ventricle has no regional wall motion abnormalities. The left ventricular internal cavity size was normal in size. There is no left ventricular hypertrophy. Left ventricular diastolic parameters are consistent with Grade II diastolic dysfunction (pseudonormalization).  Right Ventricle: The right ventricular size is mildly enlarged. No increase in right ventricular wall thickness. Right ventricular systolic function is normal.  Left Atrium: Left atrial size was normal in size.  Right Atrium: Right atrial size was normal in size.  Pericardium: Trivial pericardial effusion is present.  Mitral Valve: The mitral valve is normal in structure. No evidence of mitral valve regurgitation.  Tricuspid Valve: The tricuspid valve is normal in structure. Tricuspid valve regurgitation is not demonstrated.  Aortic Valve: The aortic valve is normal in structure. Aortic valve regurgitation is not visualized. Aortic valve mean gradient measures 6.0 mmHg. Aortic valve peak gradient measures 10.6 mmHg. Aortic valve area, by VTI measures 1.35 cm.  Pulmonic Valve: The pulmonic valve was normal in structure. Pulmonic valve regurgitation is not visualized.  Aorta: There is mild dilatation of the ascending aorta, measuring 37 mm.  Venous: The inferior vena cava is normal in size with greater than 50% respiratory variability, suggesting right atrial pressure of 3 mmHg.  IAS/Shunts: No atrial level shunt detected by color flow Doppler.   LEFT  VENTRICLE PLAX 2D LVIDd:         4.85 cm   Diastology LVIDs:         3.37 cm   LV e' medial:    3.92 cm/s LV PW:         1.02 cm   LV E/e' medial:  20.5 LV IVS:        1.05 cm   LV e' lateral:   6.74 cm/s LVOT diam:     1.80 cm   LV E/e' lateral: 11.9 LV SV:         47 LV SV Index:   27        2D Longitudinal Strain LVOT Area:     2.54 cm  2D Strain GLS Avg:     -12.0 %  3D Volume EF: 3D EF:        67 % LV EDV:       120 ml LV ESV:       39 ml LV SV:        81 ml  RIGHT VENTRICLE RV Basal diam:  4.67 cm RV  Mid diam:    2.93 cm RV S prime:     12.30 cm/s TAPSE (M-mode): 1.6 cm  LEFT ATRIUM             Index        RIGHT ATRIUM           Index LA diam:        4.40 cm 2.50 cm/m   RA Area:     16.20 cm LA Vol (A2C):   33.1 ml 18.79 ml/m  RA Volume:   43.20 ml  24.52 ml/m LA Vol (A4C):   46.4 ml 26.34 ml/m LA Biplane Vol: 41.3 ml 23.44 ml/m AORTIC VALVE AV Area (Vmax):    1.35 cm AV Area (Vmean):   1.32 cm AV Area (VTI):     1.35 cm AV Vmax:           163.00 cm/s AV Vmean:          110.000 cm/s AV VTI:            0.346 m AV Peak Grad:      10.6 mmHg AV Mean Grad:      6.0 mmHg LVOT Vmax:         86.50 cm/s LVOT Vmean:        57.100 cm/s LVOT VTI:          0.184 m LVOT/AV VTI ratio: 0.53  AORTA Ao Root diam: 3.00 cm Ao Asc diam:  3.70 cm  MITRAL VALVE               TRICUSPID VALVE MV Area (PHT): 3.77 cm    TR Peak grad:   19.7 mmHg MV Decel Time: 201 msec    TR Vmax:        222.00 cm/s MR Peak grad: 29.4 mmHg MR Vmax:      271.00 cm/s  SHUNTS MV E velocity: 80.20 cm/s  Systemic VTI:  0.18 m MV A velocity: 75.50 cm/s  Systemic Diam: 1.80 cm MV E/A ratio:  1.06  Photographer signed by Carolan Clines Signature Date/Time: 08/13/2022/1:18:52 PM    Final    MONITORS  CARDIAC EVENT MONITOR 02/13/2023  Narrative   Predominant rhythm was normal sinus rhythm with an average heart rate of 64 bpm and ranged from 46 to 108 bpm.   Occasional PVCs    Frequent PACs   Possible nonsustained atrial fibrillation.  There is significant wandering baseline artifact which signfiicantly reduces ability to accurately detect atrial fibrillation   CT SCANS  CT CORONARY MORPH W/CTA COR W/SCORE 12/26/2022  Addendum 12/29/2022  9:51 AM ADDENDUM REPORT: 12/29/2022 09:49  EXAM: OVER-READ INTERPRETATION  CT CHEST  The following report is an over-read performed by radiologist Dr. Noe Gens [facility] on 12/29/2022. This over-read does not include interpretation of cardiac or coronary anatomy or pathology. The coronary CTA interpretation by the cardiologist is attached.  COMPARISON:  None.  FINDINGS: Heart is normal size. Aorta normal caliber. Calcifications in the descending thoracic aorta. No adenopathy. No confluent opacities or effusions. No acute findings in the upper abdomen. Chest wall soft tissues are unremarkable. No acute bony abnormality.  IMPRESSION: No acute extra cardiac abnormality.  Descending aortic atherosclerosis.   Electronically Signed By: Charlett Nose M.D. On: 12/29/2022 09:49  Narrative HISTORY: 82 yo female with chest pain, nonspecific  EXAM: Cardiac/Coronary CTA  TECHNIQUE: The patient was scanned on a [employer].  PROTOCOL: A 120 kV prospective scan was triggered  in the descending thoracic aorta at 111 HU's. Axial non-contrast 3 mm slices were carried out through the heart. The data set was analyzed on a dedicated work station and scored using the Agatson method. Gantry rotation speed was 250 msecs and collimation was .6 mm. Beta blockade and 0.8 mg of sl NTG was given. The 3D data set was reconstructed in 5% intervals of the 35-75 % of the R-R cycle. Diastolic phases were analyzed on a dedicated work station using MPR, MIP and VRT modes. The patient received OMNIPAQUE IOHEXOL 350 MG/ML SOLN of contrast.  FINDINGS: Quality: Good, HR 57  Coronary calcium score: The  patient's coronary artery calcium score is 41.7, which places the patient in the 31st percentile.  Coronary arteries: Normal coronary origins.  Right dominance.  Right Coronary Artery: Dominant.  Normal vessel.  Left Main Coronary Artery: Short vessel. Immediately bifurcates into the LAD and LCx arteries.  Left Anterior Descending Coronary Artery: Anterior artery that does not reach the apex. Minimal 1-24% ostial non-calcified stenosis.  Left Circumflex Artery: Lateral artery with minimal mixed 1-24% non-obstructive disease. Large OM1 branch without significant stenosis.  Aorta: Normal size, 34 mm at the mid ascending aorta (level of the PA bifurcation) measured double oblique. Aortic atherosclerosis. No dissection.  Aortic Valve: Trileaflet. No calcifications.  Other findings:  Normal pulmonary vein drainage into the left atrium.  Normal left atrial appendage without a thrombus.  Dilated main pulmonary artery to 34 mm, suggestive of pulmonary hypertension.  Dilated coronary sinus at 14 mm.  IMPRESSION: 1. Minimal mixed non-obstructive CAD, CADRADS = 1.  2. Coronary calcium score of 41.7. This was 31st percentile for age and sex matched control.  3. Normal coronary origin with right dominance.  4. Dilated main pulmonary artery to 34 mm, suggestive of pulmonary hypertension.  5. Dilated coronary sinus at 14 mm.  6. Aortic atherosclerosis  Electronically Signed: By: Chrystie Nose M.D. On: 12/26/2022 16:12          Risk Assessment/Calculations:    CHA2DS2-VASc Score = 5   This indicates a 7.2% annual risk of stroke. The patient's score is based upon: CHF History: 0 HTN History: 1 Diabetes History: 0 Stroke History: 0 Vascular Disease History: 1 Age Score: 2 Gender Score: 1           Physical Exam:   VS:  BP 128/78   Pulse (!) 54   Ht 5' 1.5" (1.562 m)   Wt 162 lb (73.5 kg)   BMI 30.11 kg/m    Wt Readings from Last 3 Encounters:  07/21/23  162 lb (73.5 kg)  03/24/23 162 lb (73.5 kg)  03/04/23 170 lb 6.4 oz (77.3 kg)    GEN: Well nourished, well developed in no acute distress NECK: No JVD; No carotid bruits CARDIAC: RRR, no murmurs, rubs, gallops RESPIRATORY:  Clear to auscultation without rales, wheezing or rhonchi  ABDOMEN: Soft, non-tender, non-distended EXTREMITIES:  No edema; No deformity   ASSESSMENT AND PLAN: .    Persistent atrial fibrillation / Hypercoagulable state - Heart rate in the 40s when she is sleeping - reassurance provided. Heart rate in clinic today 54 bpm and persistently in 50s in prior clinic visits. No daytime bradycardia. No lightheadedness, dizziness. To avoid her splitting Atenolol 100mg  tablet in half, will Rx Atenolol 50mg  BID. CHA2DS2-VASc Score = 5 [CHF History: 0, HTN History: 1, Diabetes History: 0, Stroke History: 0, Vascular Disease History: 1, Age Score: 2, Gender Score: 1].  Therefore, the patient's  annual risk of stroke is 7.2 %.    Continue Eliquis 5mg  BID, denies bleeding complications.   HTN - BP elevated in clinic but controlled at home. She will contact us if persistently elevated at home. Discussed to monitor BP at home at least 2 hours after medications and sitting for 5-10 minutes.   OSA - CPAP compliance encouraged. She is wearing regularly.   CAD aortic atherosclerosis/hyperlipidemia, LDL goal less than 70- Nonobstructive by LHC 2012.  Coronary CTA 12/2022 minimal nonobstructive CAD with coronary calcium score 41.7. 05/2023 LDL 64.   GDMT atenolol, rosuvastatin.  No aspirin due to [facility]       Dispo: 12/2023 with Dr. Mayford Knife and 01/2024 or 03/2024 with AFib clinic  Signed, Alver Sorrow, NP

## 2023-07-21 NOTE — Patient Instructions (Addendum)
Medication Instructions:  Your physician has recommended you make the following change in your medication:   CHANGE Atenolol to one 50mg  tablet twice daily  We have sent you in a new prescription to [facility].   *If you need a refill on your cardiac medications before your next appointment, please call your pharmacy*  Follow-Up: At [facility], you and your health needs are our priority.  As part of our continuing mission to provide you with exceptional heart care, we have created designated Provider Care Teams.  These Care Teams include your primary Cardiologist (physician) and Advanced Practice Providers (APPs -  Physician Assistants and Nurse Practitioners) who all work together to provide you with the care you need, when you need it.  We recommend signing up for the patient portal called "MyChart".  Sign up information is provided on this After Visit Summary.  MyChart is used to connect with patients for Virtual Visits (Telemedicine).  Patients are able to view lab/test results, encounter notes, upcoming appointments, etc.  Non-urgent messages can be sent to your provider as well.   To learn more about what you can do with MyChart, go to [email or web address].    Your next appointment:   Follow up 12/2023 with Dr. Mayford Knife We will contact you closer to time to schedule this appointment  In 01/2024 or 03/2024 with AFib Clinic Other Instructions  It is okay for your heart rate to be in the 30-40s when you are sleeping. We want your heart rate during the daytime hours to be 50-80s. If you note your heart rate is low during the daytime or you are experiencing lightheadedness or dizziness, please call and let us know.

## 2023-07-22 ENCOUNTER — Encounter (HOSPITAL_COMMUNITY): Payer: Self-pay | Admitting: Gastroenterology

## 2023-07-22 ENCOUNTER — Encounter (HOSPITAL_COMMUNITY)
Admission: RE | Disposition: A | Discharge status: Home / Self Care | Payer: Self-pay | Source: Home / Self Care | Attending: Gastroenterology

## 2023-07-22 ENCOUNTER — Ambulatory Visit (HOSPITAL_COMMUNITY)
Admission: RE | Admit: 2023-07-22 | Discharge: 2023-07-22 | Disposition: A | Discharge status: Home / Self Care | Payer: PPO | Attending: Gastroenterology | Admitting: Gastroenterology

## 2023-07-22 DIAGNOSIS — R1310 Dysphagia, unspecified: Secondary | ICD-10-CM | POA: Diagnosis not present

## 2023-07-22 HISTORY — PX: ESOPHAGEAL MANOMETRY: SHX5429

## 2023-07-22 SURGERY — MANOMETRY, ESOPHAGUS

## 2023-07-22 MED ORDER — LIDOCAINE VISCOUS HCL 2 % MT SOLN
OROMUCOSAL | Status: AC
  Filled 2023-07-22: qty 15

## 2023-07-22 SURGICAL SUPPLY — 2 items
FACESHIELD LNG OPTICON STERILE (SAFETY) IMPLANT
GLOVE BIO SURGEON STRL SZ8 (GLOVE) ×4 IMPLANT

## 2023-07-22 NOTE — Progress Notes (Signed)
Esophageal manometry performed per protocol.  Patient tolerated well.

## 2023-07-24 DIAGNOSIS — N958 Other specified menopausal and perimenopausal disorders: Secondary | ICD-10-CM | POA: Diagnosis not present

## 2023-07-24 DIAGNOSIS — Z1231 Encounter for screening mammogram for malignant neoplasm of breast: Secondary | ICD-10-CM | POA: Diagnosis not present

## 2023-07-25 ENCOUNTER — Encounter (HOSPITAL_COMMUNITY): Payer: Self-pay | Admitting: Gastroenterology

## 2023-07-26 DIAGNOSIS — R1310 Dysphagia, unspecified: Secondary | ICD-10-CM | POA: Diagnosis not present

## 2023-07-29 ENCOUNTER — Telehealth: Payer: Self-pay | Admitting: Cardiology

## 2023-07-29 NOTE — Telephone Encounter (Signed)
STAT if HR is under 50 or over 120 (normal HR is 60-100 beats per minute)  What is your heart rate? 37; 38 drops at night time  Do you have a log of your heart rate readings (document readings)?    Do you have any other symptoms? Ankles are swollen and it hurts her to walk on them.

## 2023-07-30 NOTE — Telephone Encounter (Signed)
Returned patient's call, no answer. Patient w/ complete of leg swelling and low HR at night. Patient has lasix at home to help with PRN leg swelling and also saw C. Walker NP on 07/21/23. Left detailed message explaining I have made her an appointment with T. Vertell Novak at [facility]. Office at 2:40 pm. Advised she can call our office to discuss symptoms further, or if she feels she needs to reschedule upcoming appointment. Advised that if symptoms escalate to SOB or weakness/syncope she would need to have someone take her to ED or call 911.

## 2023-08-02 DIAGNOSIS — M545 Low back pain, unspecified: Secondary | ICD-10-CM | POA: Diagnosis not present

## 2023-08-02 DIAGNOSIS — G4733 Obstructive sleep apnea (adult) (pediatric): Secondary | ICD-10-CM | POA: Diagnosis not present

## 2023-08-04 ENCOUNTER — Ambulatory Visit: Payer: PPO | Attending: Physician Assistant | Admitting: Physician Assistant

## 2023-08-04 NOTE — Progress Notes (Deleted)
Cardiology Office Note:  .   Date:  08/04/2023  ID:  Tabitha Murphy, DOB 03/25/1941, MRN [id number] PCP: Aliene Beams, MD  [facility] Providers Cardiologist:  Armanda Magic, MD {   History of Present Illness: .   Tabitha Murphy is a 82 y.o. female with a past medical history of CAD, hypertension, OSA, hyperlipidemia, hypothyroidism, atrial fibrillation here for follow-up appointment.  Diagnosed with A-fib in 3/22 and presented to the ED with intermittent chest pain.  Echo LVEF 45 to 50% with hypokinetic basal segments.  Lexi with no ischemia.  She converted spontaneously on her own in the outpatient setting.  Since that time has had intermittent episodes of atrial fibrillation.  Echo 9/23 with LVEF 55 to 60%, grade 2 DD, no significant valvular findings, mild dilation of ascending aorta 37 mm.  She was transition from HCTZ to Lasix 20 mg daily.  Stress test 12/2022 with no ischemia.  Coronary CTA 12/2022 with coronary calcium score of 41.7 with overall minimal nonobstructive coronary artery disease.  30 monitor 01/2023 with only 1 hour total of rate controlled atrial fibrillation which she was asymptomatic.  She was seen in 07/2019.  Dan Humphreys, NP and at that time she contacted the office for bradycardia.  She was on atenolol 50 mg twice a day and diltiazem 60 mg twice daily.  She noted that her thyroid to be in the 40s over the last week when she was sleeping.  No dizziness or lightheadedness.  Noted poor energy level over the last year.  About a year ago her kitchen remodel was able to move everything out of the kitchen but when she went to move things back a few months later felt very fatigued.  Does [employer] daily (MWF arthritis class, TR yoga and water) feels good with those activities.  #1 at 5 hours of sleep as it takes couple hours to fall asleep.  Does wear CPAP regularly without issue.  Monitoring BP at home without concerns.  Only needed as needed Lasix when traveling.  Today, she**  ROS:  ***  Studies Reviewed: .        *** Risk Assessment/Calculations:   {Does this patient have ATRIAL FIBRILLATION?:[phone]} No BP recorded.  {Refresh Note OR Click here to enter BP  :1}***       Physical Exam:   VS:  There were no vitals taken for this visit.   Wt Readings from Last 3 Encounters:  07/21/23 162 lb (73.5 kg)  03/24/23 162 lb (73.5 kg)  03/04/23 170 lb 6.4 oz (77.3 kg)    GEN: Well nourished, well developed in no acute distress NECK: No JVD; No carotid bruits CARDIAC: ***RRR, no murmurs, rubs, gallops RESPIRATORY:  Clear to auscultation without rales, wheezing or rhonchi  ABDOMEN: Soft, non-tender, non-distended EXTREMITIES:  No edema; No deformity   ASSESSMENT AND PLAN: .   1.  Persistent atrial fibrillation/hypercoagulable state 2.  Hypertension 3. OSA  4. CAD/aortic atherosclerosis/hyperlipidemia LDL goal less than 70   {Are you ordering a CV Procedure (e.g. stress test, cath, DCCV, TEE, etc)?   Press F2        :[id number]}  Dispo: ***  Signed, Sharlene Dory, PA-C

## 2023-08-28 ENCOUNTER — Emergency Department (HOSPITAL_BASED_OUTPATIENT_CLINIC_OR_DEPARTMENT_OTHER): Payer: PPO

## 2023-08-28 ENCOUNTER — Emergency Department (HOSPITAL_BASED_OUTPATIENT_CLINIC_OR_DEPARTMENT_OTHER)
Admission: EM | Admit: 2023-08-28 | Discharge: 2023-08-28 | Disposition: A | Discharge status: Home / Self Care | Payer: PPO | Attending: Emergency Medicine | Admitting: Emergency Medicine

## 2023-08-28 ENCOUNTER — Encounter (HOSPITAL_BASED_OUTPATIENT_CLINIC_OR_DEPARTMENT_OTHER): Payer: Self-pay

## 2023-08-28 ENCOUNTER — Other Ambulatory Visit: Payer: Self-pay

## 2023-08-28 DIAGNOSIS — Z79899 Other long term (current) drug therapy: Secondary | ICD-10-CM | POA: Insufficient documentation

## 2023-08-28 DIAGNOSIS — E871 Hypo-osmolality and hyponatremia: Secondary | ICD-10-CM | POA: Diagnosis not present

## 2023-08-28 DIAGNOSIS — E039 Hypothyroidism, unspecified: Secondary | ICD-10-CM | POA: Diagnosis not present

## 2023-08-28 DIAGNOSIS — E860 Dehydration: Secondary | ICD-10-CM | POA: Diagnosis not present

## 2023-08-28 DIAGNOSIS — I110 Hypertensive heart disease with heart failure: Secondary | ICD-10-CM | POA: Diagnosis not present

## 2023-08-28 DIAGNOSIS — R197 Diarrhea, unspecified: Secondary | ICD-10-CM | POA: Diagnosis not present

## 2023-08-28 DIAGNOSIS — R5383 Other fatigue: Secondary | ICD-10-CM | POA: Diagnosis not present

## 2023-08-28 DIAGNOSIS — F419 Anxiety disorder, unspecified: Secondary | ICD-10-CM | POA: Diagnosis not present

## 2023-08-28 DIAGNOSIS — B349 Viral infection, unspecified: Secondary | ICD-10-CM | POA: Diagnosis not present

## 2023-08-28 DIAGNOSIS — Z7901 Long term (current) use of anticoagulants: Secondary | ICD-10-CM | POA: Diagnosis not present

## 2023-08-28 DIAGNOSIS — Z20822 Contact with and (suspected) exposure to covid-19: Secondary | ICD-10-CM | POA: Diagnosis not present

## 2023-08-28 DIAGNOSIS — R112 Nausea with vomiting, unspecified: Secondary | ICD-10-CM | POA: Diagnosis not present

## 2023-08-28 DIAGNOSIS — R1110 Vomiting, unspecified: Secondary | ICD-10-CM | POA: Diagnosis not present

## 2023-08-28 DIAGNOSIS — I2510 Atherosclerotic heart disease of native coronary artery without angina pectoris: Secondary | ICD-10-CM | POA: Insufficient documentation

## 2023-08-28 DIAGNOSIS — I700 Atherosclerosis of aorta: Secondary | ICD-10-CM | POA: Diagnosis not present

## 2023-08-28 DIAGNOSIS — I509 Heart failure, unspecified: Secondary | ICD-10-CM | POA: Insufficient documentation

## 2023-08-28 DIAGNOSIS — I517 Cardiomegaly: Secondary | ICD-10-CM | POA: Diagnosis not present

## 2023-08-28 DIAGNOSIS — J40 Bronchitis, not specified as acute or chronic: Secondary | ICD-10-CM | POA: Diagnosis not present

## 2023-08-28 LAB — COMPREHENSIVE METABOLIC PANEL
ALT: 9 U/L (ref 0–44)
AST: 17 U/L (ref 15–41)
Albumin: 4.5 g/dL (ref 3.5–5.0)
Alkaline Phosphatase: 97 U/L (ref 38–126)
Anion gap: 14 (ref 5–15)
BUN: 9 mg/dL (ref 8–23)
CO2: 25 mmol/L (ref 22–32)
Calcium: 9.6 mg/dL (ref 8.9–10.3)
Chloride: 89 mmol/L — ABNORMAL LOW (ref 98–111)
Creatinine, Ser: 0.8 mg/dL (ref 0.44–1.00)
GFR, Estimated: >60 mL/min (ref >60)
Glucose, Bld: 104 mg/dL — ABNORMAL HIGH (ref 70–99)
Potassium: 3.3 mmol/L — ABNORMAL LOW (ref 3.5–5.1)
Sodium: 128 mmol/L — ABNORMAL LOW (ref 135–145)
Total Bilirubin: 0.6 mg/dL (ref 0.3–1.2)
Total Protein: 7.7 g/dL (ref 6.5–8.1)

## 2023-08-28 LAB — CBC
HCT: 38.4 % (ref 36.0–46.0)
Hemoglobin: 13.6 g/dL (ref 12.0–15.0)
MCH: 30.7 pg (ref 26.0–34.0)
MCHC: 35.4 g/dL (ref 30.0–36.0)
MCV: 86.7 fL (ref 80.0–100.0)
Platelets: 333 10*3/uL (ref 150–400)
RBC: 4.43 MIL/uL (ref 3.87–5.11)
RDW: 12.8 % (ref 11.5–15.5)
WBC: 9.5 10*3/uL (ref 4.0–10.5)
nRBC: 0 % (ref 0.0–0.2)

## 2023-08-28 LAB — URINALYSIS, ROUTINE W REFLEX MICROSCOPIC
Bacteria, UA: NONE SEEN
Bilirubin Urine: NEGATIVE
Glucose, UA: NEGATIVE mg/dL
Hgb urine dipstick: NEGATIVE
Ketones, ur: NEGATIVE mg/dL
Leukocytes,Ua: NEGATIVE
Nitrite: NEGATIVE
Protein, ur: 100 mg/dL — AB
Specific Gravity, Urine: 1.02 (ref 1.005–1.030)
pH: 6.5 (ref 5.0–8.0)

## 2023-08-28 LAB — LIPASE, BLOOD: Lipase: 15 U/L (ref 11–51)

## 2023-08-28 LAB — SARS CORONAVIRUS 2 BY RT PCR: SARS Coronavirus 2 by RT PCR: NEGATIVE

## 2023-08-28 LAB — TROPONIN I (HIGH SENSITIVITY): Troponin I (High Sensitivity): 22 ng/L — ABNORMAL HIGH (ref <18)

## 2023-08-28 LAB — BRAIN NATRIURETIC PEPTIDE: B Natriuretic Peptide: 66.5 pg/mL (ref 0.0–100.0)

## 2023-08-28 NOTE — ED Notes (Signed)
Patient aware of urine sample but did not want to go at this time.

## 2023-08-28 NOTE — ED Provider Notes (Signed)
[place] EMERGENCY DEPARTMENT AT [facility] Provider Note   CSN: [id number] Arrival date & time: 08/28/23  1556     History {Add pertinent medical, surgical, social history, OB history to HPI:1} Chief Complaint  Patient presents with   Emesis    Tabitha Murphy is a 82 y.o. female.  HPI      82yo female   Exhaustion, generalized weakness, so weak couldn't do anything First of the week had a temperature but not since Tuesday Has been weak and tired, not able to get around Breathing problems but afraid to use inhaler because of heart problems Thought maybe COVID Had shortness of breath for about a week  No chest pain Tuesday nausea, Monday Tuesday but not since has vomited Diarrhea, today twice Rest of the week was more 5-6, not black or bloody Lightheadedness No abdominal pain.l     Past Medical History:  Diagnosis Date   Benign essential HTN    CAD (coronary artery disease), native coronary artery    Cath 2012 with 30% LCx; Coronary CTA showed minimal mixed nonobstructive disease.  Less than 25% ostial LAD and left circumflex with coronary calcium score 41.7 on  12/2022   DCM (dilated cardiomyopathy) (HCC)    ? tachy mediated from afib with RVR.  EF 45-50% by echo 01/2021.  Lexiscan myoview with no ischemia   Diverticulosis    Hiatal hernia    HLD (hyperlipidemia)    Persistent atrial fibrillation (HCC)    On DOAC   S/P laparoscopic cholecystectomy 07/28/2011   23 years ago   Thyroid goiter     Home Medications Prior to Admission medications   Medication Sig Start Date End Date Taking? Authorizing Provider  albuterol (VENTOLIN HFA) 108 (90 Base) MCG/ACT inhaler Inhale 2 puffs into the lungs every 4 (four) hours as needed for wheezing or shortness of breath. 03/18/20   [provider]  ALPRAZolam Prudy Feeler) 0.5 MG tablet Take 0.5 mg by mouth daily as needed for anxiety or sleep.    [provider]  atenolol (TENORMIN) 50 MG tablet Take  1 tablet (50 mg total) by mouth 2 (two) times daily. 07/21/23   Alver Sorrow, NP  b complex vitamins tablet Take 1 tablet by mouth daily.  Super    [provider]  Biotin 78295 MCG TBDP Take 10,000 mcg by mouth daily.    [provider]  bisacodyl (DULCOLAX) 5 MG EC tablet Take 5 mg by mouth daily as needed for mild constipation or moderate constipation.    [provider]  CALCIUM PO Take 1 tablet by mouth daily.    [provider]  diltiazem (CARDIZEM) 60 MG tablet Take 1 tablet (60 mg total) by mouth 2 (two) times daily. 05/05/23 05/04/24  Fenton, Clint R, PA  ELIQUIS 5 MG TABS tablet TAKE 1 TABLET BY MOUTH TWICE A DAY Patient taking differently: Take 5 mg by mouth 2 (two) times daily. 03/04/23   Quintella Reichert, MD  furosemide (LASIX) 20 MG tablet Take one tablet (20 mg) as needed for swelling but call the office if taking more than twice a week. 09/11/22   Alver Sorrow, NP  hydrochlorothiazide (MICROZIDE) 12.5 MG capsule TAKE 1 CAPSULE BY MOUTH EVERY DAY 06/25/23   Quintella Reichert, MD  HYDROcodone-acetaminophen (NORCO/VICODIN) 5-325 MG tablet Take 1 tablet by mouth every 6 (six) hours as needed for severe pain.    [provider]  hydrOXYzine (ATARAX) 25 MG tablet Take 25  mg by mouth daily as needed for itching. 01/06/23   [provider]  irbesartan (AVAPRO) 300 MG tablet Take 300 mg by mouth every morning. 02/02/21   [provider]  levothyroxine (SYNTHROID) 25 MCG tablet Take 25 mcg by mouth every morning. 02/25/21   [provider]  Multiple Vitamin (MULTIVITAMIN) tablet Take 1 tablet by mouth daily.    [provider]  omeprazole (PRILOSEC OTC) 20 MG tablet Take 10 mg by mouth daily.    [provider]  Polyethyl Glycol-Propyl Glycol (SYSTANE) 0.4-0.3 % SOLN Place 1 drop into both eyes daily as needed (Dry eyes).    [provider]  promethazine (PHENERGAN) 25 MG tablet Take 25 mg by mouth  every 6 (six) hours as needed for nausea or vomiting.    [provider]  rosuvastatin (CRESTOR) 5 MG tablet TAKE 1 TABLET (5 MG TOTAL) BY MOUTH DAILY. Patient taking differently: Take 5 mg by mouth at bedtime. 02/20/23   Quintella Reichert, MD  Selenium 100 MCG TABS Take 100 mcg by mouth daily.    [provider]  valACYclovir (VALTREX) 1000 MG tablet Take 1,000 mg by mouth daily as needed (fever blisters).    [provider]  vitamin C (ASCORBIC ACID) 500 MG tablet Take 500 mg by mouth daily.    [provider]      Allergies    Oxycodone hcl, Amlodipine besylate, Codeine, Crest tartar control, Doxazosin mesylate, Duloxetine, Duloxetine hcl, Gabapentin, Lyrica [pregabalin], Rosuvastatin calcium, and Zolpidem tartrate    Review of Systems   Review of Systems  Physical Exam Updated Vital Signs BP (!) 150/71 (BP Location: Left Arm)   Pulse (!) 56   Temp 98.1 F (36.7 C) (Oral)   Resp 20   Ht 5' 1.5" (1.562 m)   Wt 72.1 kg   SpO2 100%   BMI 29.56 kg/m  Physical Exam  ED Results / Procedures / Treatments   Labs (all labs ordered are listed, but only abnormal results are displayed) Labs Reviewed  COMPREHENSIVE METABOLIC PANEL - Abnormal; Notable for the following components:      Result Value   Sodium 128 (*)    Potassium 3.3 (*)    Chloride 89 (*)    Glucose, Bld 104 (*)    All other components within normal limits  LIPASE, BLOOD  CBC  URINALYSIS, ROUTINE W REFLEX MICROSCOPIC    EKG None  Radiology No results found.  Procedures Procedures  {Document cardiac monitor, telemetry assessment procedure when appropriate:1}  Medications Ordered in ED Medications - No data to display  ED Course/ Medical Decision Making/ A&P   {   Click here for ABCD2, HEART and other calculatorsREFRESH Note before signing :1}                              Medical Decision Making Amount and/or Complexity of Data Reviewed Labs: ordered. Radiology:  ordered.   ***  {Document critical care time when appropriate:1} {Document review of labs and clinical decision tools ie heart score, Chads2Vasc2 etc:1}  {Document your independent review of radiology images, and any outside records:1} {Document your discussion with family members, caretakers, and with consultants:1} {Document social determinants of health affecting pt's care:1} {Document your decision making why or why not admission, treatments were needed:1} Final Clinical Impression(s) / ED Diagnoses Final diagnoses:  None    Rx / DC Orders ED Discharge Orders  None

## 2023-08-28 NOTE — ED Triage Notes (Signed)
Pt presents with N/V/D that started 4 days ago. Pt reports associated body aches and generalized weakness. Pt was seeing her PCP for routine check-up today and was referred to the ED.

## 2023-09-01 DIAGNOSIS — M545 Low back pain, unspecified: Secondary | ICD-10-CM | POA: Diagnosis not present

## 2023-09-01 DIAGNOSIS — G4733 Obstructive sleep apnea (adult) (pediatric): Secondary | ICD-10-CM | POA: Diagnosis not present

## 2023-09-02 ENCOUNTER — Ambulatory Visit (HOSPITAL_COMMUNITY): Payer: PPO | Admitting: Physician Assistant

## 2023-09-09 DIAGNOSIS — Z961 Presence of intraocular lens: Secondary | ICD-10-CM | POA: Diagnosis not present

## 2023-09-09 DIAGNOSIS — H02403 Unspecified ptosis of bilateral eyelids: Secondary | ICD-10-CM | POA: Diagnosis not present

## 2023-09-16 DIAGNOSIS — Z23 Encounter for immunization: Secondary | ICD-10-CM | POA: Diagnosis not present

## 2023-10-02 DIAGNOSIS — M545 Low back pain, unspecified: Secondary | ICD-10-CM | POA: Diagnosis not present

## 2023-10-02 DIAGNOSIS — G4733 Obstructive sleep apnea (adult) (pediatric): Secondary | ICD-10-CM | POA: Diagnosis not present

## 2023-10-06 DIAGNOSIS — M159 Polyosteoarthritis, unspecified: Secondary | ICD-10-CM | POA: Diagnosis not present

## 2023-11-01 DIAGNOSIS — M545 Low back pain, unspecified: Secondary | ICD-10-CM | POA: Diagnosis not present

## 2023-11-01 DIAGNOSIS — G4733 Obstructive sleep apnea (adult) (pediatric): Secondary | ICD-10-CM | POA: Diagnosis not present

## 2023-11-02 DIAGNOSIS — E569 Vitamin deficiency, unspecified: Secondary | ICD-10-CM | POA: Diagnosis not present

## 2023-11-02 DIAGNOSIS — M25552 Pain in left hip: Secondary | ICD-10-CM | POA: Diagnosis not present

## 2023-11-02 DIAGNOSIS — M2560 Stiffness of unspecified joint, not elsewhere classified: Secondary | ICD-10-CM | POA: Diagnosis not present

## 2023-11-02 DIAGNOSIS — M79642 Pain in left hand: Secondary | ICD-10-CM | POA: Diagnosis not present

## 2023-11-02 DIAGNOSIS — M25551 Pain in right hip: Secondary | ICD-10-CM | POA: Diagnosis not present

## 2023-11-02 DIAGNOSIS — M79641 Pain in right hand: Secondary | ICD-10-CM | POA: Diagnosis not present

## 2023-11-02 DIAGNOSIS — E669 Obesity, unspecified: Secondary | ICD-10-CM | POA: Diagnosis not present

## 2023-11-02 DIAGNOSIS — Z6831 Body mass index (BMI) 31.0-31.9, adult: Secondary | ICD-10-CM | POA: Diagnosis not present

## 2023-11-02 DIAGNOSIS — M2540 Effusion, unspecified joint: Secondary | ICD-10-CM | POA: Diagnosis not present

## 2023-11-02 DIAGNOSIS — R5383 Other fatigue: Secondary | ICD-10-CM | POA: Diagnosis not present

## 2023-11-02 DIAGNOSIS — M1991 Primary osteoarthritis, unspecified site: Secondary | ICD-10-CM | POA: Diagnosis not present

## 2023-11-04 DIAGNOSIS — L814 Other melanin hyperpigmentation: Secondary | ICD-10-CM | POA: Diagnosis not present

## 2023-11-04 DIAGNOSIS — L821 Other seborrheic keratosis: Secondary | ICD-10-CM | POA: Diagnosis not present

## 2023-11-04 DIAGNOSIS — L7211 Pilar cyst: Secondary | ICD-10-CM | POA: Diagnosis not present

## 2023-11-04 DIAGNOSIS — D225 Melanocytic nevi of trunk: Secondary | ICD-10-CM | POA: Diagnosis not present

## 2023-11-04 DIAGNOSIS — L570 Actinic keratosis: Secondary | ICD-10-CM | POA: Diagnosis not present

## 2023-11-11 DIAGNOSIS — M2540 Effusion, unspecified joint: Secondary | ICD-10-CM | POA: Diagnosis not present

## 2023-11-11 DIAGNOSIS — M25552 Pain in left hip: Secondary | ICD-10-CM | POA: Diagnosis not present

## 2023-11-11 DIAGNOSIS — M79641 Pain in right hand: Secondary | ICD-10-CM | POA: Diagnosis not present

## 2023-11-11 DIAGNOSIS — M2560 Stiffness of unspecified joint, not elsewhere classified: Secondary | ICD-10-CM | POA: Diagnosis not present

## 2023-11-11 DIAGNOSIS — E669 Obesity, unspecified: Secondary | ICD-10-CM | POA: Diagnosis not present

## 2023-11-11 DIAGNOSIS — M25559 Pain in unspecified hip: Secondary | ICD-10-CM | POA: Diagnosis not present

## 2023-11-11 DIAGNOSIS — R5383 Other fatigue: Secondary | ICD-10-CM | POA: Diagnosis not present

## 2023-11-11 DIAGNOSIS — Z6831 Body mass index (BMI) 31.0-31.9, adult: Secondary | ICD-10-CM | POA: Diagnosis not present

## 2023-11-11 DIAGNOSIS — M25511 Pain in right shoulder: Secondary | ICD-10-CM | POA: Diagnosis not present

## 2023-11-11 DIAGNOSIS — M0609 Rheumatoid arthritis without rheumatoid factor, multiple sites: Secondary | ICD-10-CM | POA: Diagnosis not present

## 2023-11-11 DIAGNOSIS — M79642 Pain in left hand: Secondary | ICD-10-CM | POA: Diagnosis not present

## 2023-11-11 DIAGNOSIS — M1991 Primary osteoarthritis, unspecified site: Secondary | ICD-10-CM | POA: Diagnosis not present

## 2023-12-10 ENCOUNTER — Encounter: Payer: Self-pay | Admitting: Plastic Surgery

## 2023-12-10 ENCOUNTER — Ambulatory Visit: Payer: PPO | Admitting: Plastic Surgery

## 2023-12-10 VITALS — BP 178/75 | HR 55 | Wt 161.3 lb

## 2023-12-10 DIAGNOSIS — R220 Localized swelling, mass and lump, head: Secondary | ICD-10-CM | POA: Diagnosis not present

## 2023-12-10 DIAGNOSIS — D489 Neoplasm of uncertain behavior, unspecified: Secondary | ICD-10-CM

## 2023-12-10 NOTE — Progress Notes (Signed)
 Referring Provider Rolinda Millman, MD [phone] Tabitha Murphy [address] [place],  [place] [address]   CC:  Chief Complaint  Patient presents with   Advice Only   Skin Problem      Tabitha Murphy is an 83 y.o. female.  HPI: Tabitha Murphy is an 83 year old female who is referred today for management of a cyst on her frontal scalp.  She states the cyst has been there for approximately 3 years and that she was seen by dermatologist who felt that it should be removed.  She denies any trauma to the area and denies any evidence of infection.  She does state that it hurts occasionally.  Allergies  Allergen Reactions   Oxycodone Hcl Other (See Comments)    Can take with promethazine     Amlodipine Besylate     Other reaction(s): swelling along with aches and pains   Codeine     REACTION: nausea   Crest Tartar Control     Other reaction(s): skin around mouth comes off   Doxazosin Mesylate     Other reaction(s): SOB   Duloxetine  Diarrhea   Duloxetine  Hcl     Other reaction(s): Heaviness and pains in chest and back/dry mouth   Gabapentin     Other reaction(s): causes aches and pains   Lyrica  [Pregabalin ] Other (See Comments)    Made pain in legs and feet worse.   Rosuvastatin  Calcium      Other reaction(s): knee and leg pains   Zolpidem Tartrate     Other reaction(s): retrograde amnesia    Outpatient Encounter Medications as of 12/10/2023  Medication Sig   albuterol  (VENTOLIN  HFA) 108 (90 Base) MCG/ACT inhaler Inhale 2 puffs into the lungs every 4 (four) hours as needed for wheezing or shortness of breath.   ALPRAZolam  (XANAX ) 0.5 MG tablet Take 0.5 mg by mouth daily as needed for anxiety or sleep.   atenolol  (TENORMIN ) 50 MG tablet Take 1 tablet (50 mg total) by mouth 2 (two) times daily.   b complex vitamins tablet Take 1 tablet by mouth daily.  Super   Biotin 89999 MCG TBDP Take 10,000 mcg by mouth daily.   bisacodyl  (DULCOLAX) 5 MG EC tablet Take 5 mg by mouth daily as needed for mild  constipation or moderate constipation.   CALCIUM  PO Take 1 tablet by mouth daily.   diltiazem  (CARDIZEM ) 60 MG tablet Take 1 tablet (60 mg total) by mouth 2 (two) times daily.   ELIQUIS  5 MG TABS tablet TAKE 1 TABLET BY MOUTH TWICE A DAY (Patient taking differently: Take 5 mg by mouth 2 (two) times daily.)   furosemide  (LASIX ) 20 MG tablet Take one tablet (20 mg) as needed for swelling but call the office if taking more than twice a week.   hydrochlorothiazide  (MICROZIDE ) 12.5 MG capsule TAKE 1 CAPSULE BY MOUTH EVERY DAY   HYDROcodone -acetaminophen  (NORCO/VICODIN) 5-325 MG tablet Take 1 tablet by mouth every 6 (six) hours as needed for severe pain.   hydrOXYzine  (ATARAX ) 25 MG tablet Take 25 mg by mouth daily as needed for itching.   irbesartan  (AVAPRO ) 300 MG tablet Take 300 mg by mouth every morning.   levothyroxine  (SYNTHROID ) 25 MCG tablet Take 25 mcg by mouth every morning.   Multiple Vitamin (MULTIVITAMIN) tablet Take 1 tablet by mouth daily.   omeprazole  (PRILOSEC  OTC) 20 MG tablet Take 10 mg by mouth daily.   Polyethyl Glycol-Propyl Glycol (SYSTANE) 0.4-0.3 % SOLN Place 1 drop into both eyes daily as needed (  Dry eyes).   predniSONE  (DELTASONE ) 5 MG tablet take 4 tabs daily for 3 days, then 3 tabs daily for 3 days, then 2 tabs daily for 3 days, then 1 tab daily for 3 days Orally Once a day in the morning with food for 12   promethazine  (PHENERGAN ) 25 MG tablet Take 25 mg by mouth every 6 (six) hours as needed for nausea or vomiting.   rosuvastatin  (CRESTOR ) 5 MG tablet TAKE 1 TABLET (5 MG TOTAL) BY MOUTH DAILY. (Patient taking differently: Take 5 mg by mouth at bedtime.)   Selenium 100 MCG TABS Take 100 mcg by mouth daily.   valACYclovir (VALTREX) 1000 MG tablet Take 1,000 mg by mouth daily as needed (fever blisters).   vitamin C (ASCORBIC ACID) 500 MG tablet Take 500 mg by mouth daily.   No facility-administered encounter medications on file as of 12/10/2023.     Past Medical History:   Diagnosis Date   Benign essential HTN    CAD (coronary artery disease), native coronary artery    Cath 2012 with 30% LCx; Coronary CTA showed minimal mixed nonobstructive disease.  Less than 25% ostial LAD and left circumflex with coronary calcium  score 41.7 on  12/2022   DCM (dilated cardiomyopathy) (HCC)    ? tachy mediated from afib with RVR.  EF 45-50% by echo 01/2021.  Lexiscan  myoview  with no ischemia   Diverticulosis    Hiatal hernia    HLD (hyperlipidemia)    Persistent atrial fibrillation (HCC)    On DOAC   S/P laparoscopic cholecystectomy 07/28/2011   23 years ago   Thyroid  goiter     Past Surgical History:  Procedure Laterality Date   BALLOON DILATION N/A 03/24/2023   Procedure: BALLOON DILATION;  Surgeon: Saintclair Jasper, MD;  Location: [facility];  Service: Gastroenterology;  Laterality: N/A;   child birth     x3   CHOLECYSTECTOMY     ESOPHAGEAL MANOMETRY N/A 07/22/2023   Procedure: ESOPHAGEAL MANOMETRY (EM);  Surgeon: Dianna Specking, MD;  Location: [facility];  Service: Gastroenterology;  Laterality: N/A;   ESOPHAGOGASTRODUODENOSCOPY (EGD) WITH PROPOFOL  N/A 03/24/2023   Procedure: ESOPHAGOGASTRODUODENOSCOPY (EGD) WITH PROPOFOL ;  Surgeon: Saintclair Jasper, MD;  Location: [facility];  Service: Gastroenterology;  Laterality: N/A;    Family History  Problem Relation Age of Onset   Cervical cancer Mother    CAD Sister    Heart attack Father        Died in early 103s with MI   Heart disease Brother    Colon cancer Neg Hx     Social History   Social History Narrative   Not on file     Review of Systems General: Denies fevers, chills, weight loss CV: Denies chest pain, shortness of breath, palpitations Scalp: Cyst on the frontal portion of the scalp.  Occasional discomfort no evidence of infection  Physical Exam    12/10/2023   11:10 AM 08/28/2023    8:45 PM 08/28/2023    8:30 PM  Vitals with BMI  Weight 161 lbs 5 oz    Systolic 178 152   Diastolic 75 95    Pulse 55 63 60    General:  No acute distress,  Alert and oriented, Non-Toxic, Normal speech and affect Scalp: A 1 x 1 cm mobile mass on the frontal scalp.  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  It is readily amenable to excision.  The patient does have a  history of A-fib and is on Eliquis .  Given the history of blood thinners and the fact that this is a scalp mass would be hesitant to perform this in the office due to bleeding risks.  Will schedule her for removal in the operating room.  Stands there will be a scar that I will closed the wound with staples.  There may be additional procedures required based on pathology results.  All questions were answered to her satisfaction.  Will proceed at her request after her hip replacement is completed.  Photographs were obtained today with her consent  Leonce KATHEE Birmingham 12/10/2023, 12:16 PM

## 2023-12-11 ENCOUNTER — Telehealth: Payer: Self-pay

## 2023-12-11 NOTE — Telephone Encounter (Signed)
 Faxed surgical clearance to patient's cardiologist, Cherlyn Labella, MD. Received fax success confirmation. Will update surgical clearance spreadsheet.

## 2023-12-11 NOTE — Telephone Encounter (Signed)
 Preop clearance request received. All information provided on the clearance besides the type/description of the procedure. Attempted to contact the surgeon's office to receive further information and received no answer [id number].

## 2023-12-14 NOTE — Telephone Encounter (Signed)
 Pharmacy please advise on holding Eliquis prior to cyst removal on forehead scheduled for TBD. Thank you.

## 2023-12-14 NOTE — Telephone Encounter (Signed)
   Pre-operative Risk Assessment    Patient Name: Tabitha Murphy  DOB: November 08, 1941 MRN: [id number]   Date of last office visit: 07/21/23 CAITLIN WALKER, NP Date of next office visit: NONE   Request for Surgical Clearance    Procedure:   CYST REMOVAL ON FOREHEAD  Date of Surgery:  Clearance TBD                                Surgeon:  DR. LEONCE BIRMINGHAM Surgeon's Group or Practice Name:  [facility]  Phone number:  [phone] Fax number:  [phone]   Type of Clearance Requested:   - Medical  - Pharmacy:  Hold Apixaban  (Eliquis )     Type of Anesthesia:  General    Additional requests/questions:    [name]   12/14/2023, 10:35 AM

## 2023-12-15 ENCOUNTER — Telehealth: Payer: Self-pay

## 2023-12-15 NOTE — Telephone Encounter (Signed)
 Patient with diagnosis of atrial fibrillation on Eliquis  for anticoagulation.    Procedure:   CYST REMOVAL ON FOREHEAD   Date of Surgery:  Clearance TBD   CHA2DS2-VASc Score = 5   This indicates a 7.2% annual risk of stroke. The patient's score is based upon: CHF History: 0 HTN History: 1 Diabetes History: 0 Stroke History: 0 Vascular Disease History: 1 Age Score: 2 Gender Score: 1    CrCl 63 Platelet count 333  Per office protocol, patient can hold Eliquis  for 1-2 days prior to procedure.   Patient will not need bridging with Lovenox (enoxaparin) around procedure.  **This guidance is not considered finalized until pre-operative APP has relayed final recommendations.**

## 2023-12-15 NOTE — Telephone Encounter (Signed)
   Name: Tabitha Murphy  DOB: 01/14/1941  MRN: [id number]  Primary Cardiologist: Wilbert Bihari, MD   Preoperative team, please contact this patient and set up a phone call appointment for further preoperative risk assessment. Please obtain consent and complete medication review. Thank you for your help.  I confirm that guidance regarding antiplatelet and oral anticoagulation therapy has been completed and, if necessary, noted below.  CHA2DS2-VASc Score = 5   This indicates a 7.2% annual risk of stroke. The patient's score is based upon: CHF History: 0 HTN History: 1 Diabetes History: 0 Stroke History: 0 Vascular Disease History: 1 Age Score: 2 Gender Score: 1     CrCl 63 Platelet count 333   Per office protocol, patient can hold Eliquis  for 1-2 days prior to procedure.   Patient will not need bridging with Lovenox (enoxaparin) around procedure.  I also confirmed the patient resides in the state of [place] . As per [facility] Medical Board telemedicine laws, the patient must reside in the state in which the provider is licensed.   Lamarr Satterfield, NP 12/15/2023, 10:36 AM [facility]

## 2023-12-15 NOTE — Telephone Encounter (Signed)
 Patient is scheduled for preop clearance telephone visit patient voiced understanding consent and med rec done     Patient Consent for Virtual Visit         Tabitha Murphy has provided verbal consent on 12/15/2023 for a virtual visit (video or telephone).   CONSENT FOR VIRTUAL VISIT FOR:  Tabitha Murphy  By participating in this virtual visit I agree to the following:  I hereby voluntarily request, consent and authorize [facility] and its employed or contracted physicians, physician assistants, nurse practitioners or other licensed health care professionals (the Practitioner), to provide me with telemedicine health care services (the "Services) as deemed necessary by the treating Practitioner. I acknowledge and consent to receive the Services by the Practitioner via telemedicine. I understand that the telemedicine visit will involve communicating with the Practitioner through live audiovisual communication technology and the disclosure of certain medical information by electronic transmission. I acknowledge that I have been given the opportunity to request an in-person assessment or other available alternative prior to the telemedicine visit and am voluntarily participating in the telemedicine visit.  I understand that I have the right to withhold or withdraw my consent to the use of telemedicine in the course of my care at any time, without affecting my right to future care or treatment, and that the Practitioner or I may terminate the telemedicine visit at any time. I understand that I have the right to inspect all information obtained and/or recorded in the course of the telemedicine visit and may receive copies of available information for a reasonable fee.  I understand that some of the potential risks of receiving the Services via telemedicine include:  Delay or interruption in medical evaluation due to technological equipment failure or disruption; Information transmitted may not  be sufficient (e.g. poor resolution of images) to allow for appropriate medical decision making by the Practitioner; and/or  In rare instances, security protocols could fail, causing a breach of personal health information.  Furthermore, I acknowledge that it is my responsibility to provide information about my medical history, conditions and care that is complete and accurate to the best of my ability. I acknowledge that Practitioner's advice, recommendations, and/or decision may be based on factors not within their control, such as incomplete or inaccurate data provided by me or distortions of diagnostic images or specimens that may result from electronic transmissions. I understand that the practice of medicine is not an exact science and that Practitioner makes no warranties or guarantees regarding treatment outcomes. I acknowledge that a copy of this consent can be made available to me via my patient portal [facility] MyChart), or I can request a printed copy by calling the office of [facility].    I understand that my insurance will be billed for this visit.   I have read or had this consent read to me. I understand the contents of this consent, which adequately explains the benefits and risks of the Services being provided via telemedicine.  I have been provided ample opportunity to ask questions regarding this consent and the Services and have had my questions answered to my satisfaction. I give my informed consent for the services to be provided through the use of telemedicine in my medical care

## 2023-12-15 NOTE — Telephone Encounter (Signed)
 Patient is scheduled for telephone visit and consent and med rec done

## 2023-12-23 ENCOUNTER — Telehealth: Payer: Self-pay | Admitting: Cardiology

## 2023-12-23 NOTE — Telephone Encounter (Signed)
D/w preop APP Robin Searing, NP did he prefer the pt to follow up with A-fib clinic at this time as this is pt c/o. We will keep the tele preop appt 01/11/24.   I will send a message to a-fib clinic to reach out to the pt    Me: question for appt for pt. should she see a-fib clinic as it is a-fib issue right now. We have a tele 2/10 that we could keep for the preop if you want with an appt.    Robin Searing, NP: yes that probably would be best and quickest

## 2023-12-23 NOTE — Telephone Encounter (Signed)
   Name: CHENEL ROTHSCHILD  DOB: 09-01-1941  MRN: [id number]  Primary Cardiologist: Armanda Magic, MD  Chart reviewed as part of pre-operative protocol coverage. Because of Hargun C Delgreco's past medical history and time since last visit, she will require a follow-up in-office visit in order to better assess preoperative cardiovascular risk.   Pre-op covering staff: - Please schedule appointment and call patient to inform them. If patient already had an upcoming appointment within acceptable timeframe, please add "pre-op clearance" to the appointment notes so provider is aware. - Please contact requesting surgeon's office via preferred method (i.e, phone, fax) to inform them of need for appointment prior to surgery.   Napoleon Form, Leodis Rains, NP  12/23/2023, 11:28 AM

## 2023-12-23 NOTE — Telephone Encounter (Signed)
Patient c/o Palpitations:  STAT if patient reporting lightheadedness, shortness of breath, or chest pain  How long have you had palpitations/irregular HR/ Afib? Are you having the symptoms now?   Yes  Are you currently experiencing lightheadedness, SOB or CP?   No  Do you have a history of afib (atrial fibrillation) or irregular heart rhythm?   Yes  Have you checked your BP or HR? (document readings if available):   No  Are you experiencing any other symptoms?   Tooth pain  Patient stated she is in Afib and has an appointment to have her tooth filled at 12:00 noon this afternoon and wants to know if it is safe for her to have this dental procedure,

## 2023-12-25 ENCOUNTER — Ambulatory Visit (HOSPITAL_COMMUNITY)
Admission: RE | Admit: 2023-12-25 | Discharge: 2023-12-25 | Disposition: A | Discharge status: Home / Self Care | Payer: PPO | Source: Ambulatory Visit | Attending: Internal Medicine | Admitting: Internal Medicine

## 2023-12-25 VITALS — BP 132/66 | HR 53 | Ht 61.5 in | Wt 155.8 lb

## 2023-12-25 DIAGNOSIS — I10 Essential (primary) hypertension: Secondary | ICD-10-CM | POA: Diagnosis not present

## 2023-12-25 DIAGNOSIS — I4891 Unspecified atrial fibrillation: Secondary | ICD-10-CM | POA: Diagnosis present

## 2023-12-25 DIAGNOSIS — I4819 Other persistent atrial fibrillation: Secondary | ICD-10-CM | POA: Insufficient documentation

## 2023-12-25 DIAGNOSIS — I2510 Atherosclerotic heart disease of native coronary artery without angina pectoris: Secondary | ICD-10-CM | POA: Diagnosis not present

## 2023-12-25 DIAGNOSIS — Z7901 Long term (current) use of anticoagulants: Secondary | ICD-10-CM | POA: Diagnosis not present

## 2023-12-25 DIAGNOSIS — G4733 Obstructive sleep apnea (adult) (pediatric): Secondary | ICD-10-CM | POA: Insufficient documentation

## 2023-12-25 DIAGNOSIS — D6869 Other thrombophilia: Secondary | ICD-10-CM | POA: Diagnosis not present

## 2023-12-25 DIAGNOSIS — R9431 Abnormal electrocardiogram [ECG] [EKG]: Secondary | ICD-10-CM | POA: Insufficient documentation

## 2023-12-25 DIAGNOSIS — E785 Hyperlipidemia, unspecified: Secondary | ICD-10-CM | POA: Diagnosis not present

## 2023-12-25 DIAGNOSIS — E039 Hypothyroidism, unspecified: Secondary | ICD-10-CM | POA: Diagnosis not present

## 2023-12-25 NOTE — Progress Notes (Signed)
Primary Care Physician: Aliene Beams, MD Primary Cardiologist: Dr Mayford Knife Primary Electrophysiologist: none Referring Physician: Dr Roselyn Reef is a 83 y.o. female with a history of CAD, HTN, OSA, HLD, hypothyroidism, atrial fibrillation who presents for follow up in the [facility]. The patient was initially diagnosed with atrial fibrillation 01/2021 after presenting to the ED with symptoms of intermittent CP. She was in rate controlled afib. Echo showed EF 45-50% with hypokinetic basal segments. Lexiscan did not show ischemia. She was discharged in rate controlled afib with plan for outpatient DCCV but converted spontaneously on her own. Patient is on Eliquis for a CHADS2VASC score of 5. She reports that she went back into afib since 05/04/21 with symptoms of palpitations and heart racing. There were no specific triggers that she could identify. She is compliant with CPAP therapy.   She has worn both a Zio monitor and an event monitor. The 30 day event monitor only showed 1 hour total of rate controlled afib and was asymptomatic.   On follow up today, patient reports that she has felt well since her last visit. She does have rare palpitations and dizziness. No bleeding issues on anticoagulation.   On follow up 12/25/23, she is currently in Afib. Patient contacted office on 1/22 noting she was in Afib. She notes to feel tired and lightheaded at times when in Afib. No missed doses of Eliquis 5 mg BID.   Today, she denies symptoms of chest pain, shortness of breath, orthopnea, PND, lower extremity edema, presyncope, syncope, bleeding, or neurologic sequela. The patient is tolerating medications without difficulties and is otherwise without complaint today.    Atrial Fibrillation Risk Factors:  she does have symptoms or diagnosis of sleep apnea. she is compliant with CPAP therapy. she does have a history of rheumatic fever. she does not have a history of  alcohol use. The patient does not have a history of early familial atrial fibrillation or other arrhythmias.  she has a BMI of Body mass index is 28.96 kg/m.Marland Kitchen Filed Weights   12/25/23 0823  Weight: 70.7 kg     Family History  Problem Relation Age of Onset   Cervical cancer Mother    CAD Sister    Heart attack Father        Died in early 42s with MI   Heart disease Brother    Colon cancer Neg Hx      Atrial Fibrillation Management history:  Previous antiarrhythmic drugs: none Previous cardioversions: none Previous ablations: none CHADS2VASC score: 5 Anticoagulation history: Eliquis   Past Medical History:  Diagnosis Date   Benign essential HTN    CAD (coronary artery disease), native coronary artery    Cath 2012 with 30% LCx; Coronary CTA showed minimal mixed nonobstructive disease.  Less than 25% ostial LAD and left circumflex with coronary calcium score 41.7 on  12/2022   DCM (dilated cardiomyopathy) (HCC)    ? tachy mediated from afib with RVR.  EF 45-50% by echo 01/2021.  Lexiscan myoview with no ischemia   Diverticulosis    Hiatal hernia    HLD (hyperlipidemia)    Persistent atrial fibrillation (HCC)    On DOAC   S/P laparoscopic cholecystectomy 07/28/2011   23 years ago   Thyroid goiter    Past Surgical History:  Procedure Laterality Date   BALLOON DILATION N/A 03/24/2023   Procedure: BALLOON DILATION;  Surgeon: Kerin Salen, MD;  Location: [facility];  Service: Gastroenterology;  Laterality: N/A;   child birth     x3   CHOLECYSTECTOMY     ESOPHAGEAL MANOMETRY N/A 07/22/2023   Procedure: ESOPHAGEAL MANOMETRY (EM);  Surgeon: Charlott Rakes, MD;  Location: [facility];  Service: Gastroenterology;  Laterality: N/A;   ESOPHAGOGASTRODUODENOSCOPY (EGD) WITH PROPOFOL N/A 03/24/2023   Procedure: ESOPHAGOGASTRODUODENOSCOPY (EGD) WITH PROPOFOL;  Surgeon: Kerin Salen, MD;  Location: [facility];  Service: Gastroenterology;  Laterality: N/A;    Current Outpatient  Medications  Medication Sig Dispense Refill   albuterol (VENTOLIN HFA) 108 (90 Base) MCG/ACT inhaler Inhale 2 puffs into the lungs every 4 (four) hours as needed for wheezing or shortness of breath.     ALPRAZolam (XANAX) 0.5 MG tablet Take 0.5 mg by mouth daily as needed for anxiety or sleep.     atenolol (TENORMIN) 50 MG tablet Take 1 tablet (50 mg total) by mouth 2 (two) times daily. 180 tablet 3   b complex vitamins tablet Take 1 tablet by mouth daily.  Super     Biotin [address] MCG TBDP Take 10,000 mcg by mouth daily.     bisacodyl (DULCOLAX) 5 MG EC tablet Take 5 mg by mouth daily as needed for mild constipation or moderate constipation.     CALCIUM PO Take 1 tablet by mouth daily.     diltiazem (CARDIZEM) 60 MG tablet Take 1 tablet (60 mg total) by mouth 2 (two) times daily. 180 tablet 2   ELIQUIS 5 MG TABS tablet TAKE 1 TABLET BY MOUTH TWICE A DAY (Patient taking differently: Take 5 mg by mouth 2 (two) times daily.) 180 tablet 1   furosemide (LASIX) 20 MG tablet Take one tablet (20 mg) as needed for swelling but call the office if taking more than twice a week. 90 tablet 3   hydrochlorothiazide (MICROZIDE) 12.5 MG capsule TAKE 1 CAPSULE BY MOUTH EVERY DAY 90 capsule 3   HYDROcodone-acetaminophen (NORCO/VICODIN) 5-325 MG tablet Take 1 tablet by mouth as needed for severe pain (pain score 7-10).     hydrOXYzine (ATARAX) 25 MG tablet Take 25 mg by mouth daily as needed for itching.     irbesartan (AVAPRO) 300 MG tablet Take 300 mg by mouth every morning.     levothyroxine (SYNTHROID) 25 MCG tablet Take 25 mcg by mouth every morning.     lidocaine-prilocaine (EMLA) cream Apply topically 2 (two) times daily as needed.     Multiple Vitamin (MULTIVITAMIN) tablet Take 1 tablet by mouth daily.     omeprazole (PRILOSEC OTC) 20 MG tablet Take 10 mg by mouth daily.     Polyethyl Glycol-Propyl Glycol (SYSTANE) 0.4-0.3 % SOLN Place 1 drop into both eyes daily as needed (Dry eyes).     promethazine  (PHENERGAN) 25 MG tablet Take 25 mg by mouth every 6 (six) hours as needed for nausea or vomiting.     rosuvastatin (CRESTOR) 5 MG tablet TAKE 1 TABLET (5 MG TOTAL) BY MOUTH DAILY. (Patient taking differently: Take 5 mg by mouth at bedtime.) 90 tablet 3   Selenium 100 MCG TABS Take 100 mcg by mouth daily.     valACYclovir (VALTREX) 1000 MG tablet Take 2,000 mg by mouth daily as needed (fever blisters).     vitamin C (ASCORBIC ACID) 500 MG tablet Take 500 mg by mouth daily.     No current facility-administered medications for this encounter.    Allergies  Allergen Reactions   Oxycodone Hcl Other (See Comments)    Can take with promethazine  Amlodipine Besylate     Other reaction(s): swelling along with aches and pains   Codeine     REACTION: nausea   [place] Control     Other reaction(s): skin around mouth comes off   Doxazosin Mesylate     Other reaction(s): SOB   Duloxetine Diarrhea   Duloxetine Hcl     Other reaction(s): Heaviness and pains in chest and back/dry mouth   Gabapentin     Other reaction(s): causes aches and pains   Lyrica [Pregabalin] Other (See Comments)    Made pain in legs and feet worse.   Rosuvastatin Calcium     Other reaction(s): knee and leg pains   Zolpidem Tartrate     Other reaction(s): retrograde amnesia   ROS- All systems are reviewed and negative except as per the HPI above.  Physical Exam: Vitals:   12/25/23 0823  BP: 132/66  Pulse: (!) 53  Weight: 70.7 kg  Height: 5' 1.5" (1.562 m)   GEN- The patient is well appearing, alert and oriented x 3 today.   Neck - no JVD or carotid bruit noted Lungs- Clear to ausculation bilaterally, normal work of breathing Heart- Irregular bradycardic rate and rhythm, no murmurs, rubs or gallops, PMI not laterally displaced Extremities- no clubbing, cyanosis, or edema Skin - no rash or ecchymosis noted   Wt Readings from Last 3 Encounters:  12/25/23 70.7 kg  12/10/23 73.2 kg  08/28/23 72.1 kg     EKG today demonstrates  Vent. rate 53 BPM PR interval * ms QRS duration 74 ms QT/QTcB 396/371 ms P-R-T axes * 74 56 Atrial fibrillation with slow ventricular response Abnormal ECG When compared with ECG of 28-Aug-2023 16:37, PREVIOUS ECG IS PRESENT  Echo 02/15/21 demonstrated  1. Left ventricular ejection fraction, by estimation, is 45 to 50%. The  left ventricle has mildly decreased function. Left ventricular endocardial  border not optimally defined to evaluate regional wall motion. Basal  segments are hypokinetic with best preserved function in mid and apex. Left ventricular diastolic parameters are indeterminate.   2. Right ventricular systolic function is mildly reduced. The right  ventricular size is normal. Tricuspid regurgitation signal is inadequate  for assessing PA pressure.   3. Left atrial size was mildly dilated.   4. Right atrial size was mildly dilated.   5. The mitral valve is normal in structure. Trivial mitral valve  regurgitation. No evidence of mitral stenosis.   6. The aortic valve is grossly normal. There is mild calcification of the  aortic valve. Aortic valve regurgitation is not visualized. No aortic  stenosis is present.   7. The inferior vena cava is normal in size with greater than 50%  respiratory variability, suggesting right atrial pressure of 3 mmHg.   Epic records are reviewed at length today   CHA2DS2-VASc Score = 5  The patient's score is based upon: CHF History: 0 HTN History: 1 Diabetes History: 0 Stroke History: 0 Vascular Disease History: 1 Age Score: 2 Gender Score: 1      ASSESSMENT AND PLAN: 1. Persistent Atrial Fibrillation (ICD10:  I48.19) The patient's CHA2DS2-VASc score is 5, indicating a 7.2% annual risk of stroke.    She is currently in Afib.  We discussed treatment options including the procedure cardioversion to attempt to convert patient to NSR. I discussed the risks vs benefits of the procedure. After  discussion, patient declines to be scheduled for cardioversion today. She wishes to think it over more, despite her son in room  stating she should go ahead with procedure to be back in normal rhythm. Patient asks me for additional written information on the procedure which I have given at conclusion of visit. Patient also asks me that she would like to switch from her primary cardiologist to Dr. Lalla Brothers (EP). I have recommended for patient to contact cardiology and advise of her desire to transition to a different cardiologist if that is her choice. I explained to patient the general difference between a primary cardiologist and EP. I advised that patient is more than welcome to establish with Dr. Lalla Brothers at any point, but for her purposes of general cardiology would still recommend she follow with a primary cardiologist.   2. Secondary Hypercoagulable State (ICD10:  [phone]) The patient is at significant risk for stroke/thromboembolism based upon her CHA2DS2-VASc Score of 5.  Continue Apixaban (Eliquis).  No missed doses.   3. HTN Stable; no changes at this time.     Follow up in the AF clinic in 1 week to reconsider cardioversion.   Justin Mend, PA-C [facility] [address] [place], [place] [address] [phone]

## 2023-12-29 ENCOUNTER — Ambulatory Visit (HOSPITAL_COMMUNITY)
Admission: RE | Admit: 2023-12-29 | Discharge: 2023-12-29 | Discharge status: Home / Self Care | Payer: PPO | Source: Ambulatory Visit | Attending: Physician Assistant | Admitting: Physician Assistant

## 2023-12-29 ENCOUNTER — Other Ambulatory Visit (HOSPITAL_COMMUNITY): Payer: Self-pay | Admitting: Physician Assistant

## 2023-12-29 ENCOUNTER — Encounter (HOSPITAL_COMMUNITY): Payer: Self-pay | Admitting: Physician Assistant

## 2023-12-29 VITALS — BP 150/80 | HR 70 | Ht 61.5 in | Wt 159.4 lb

## 2023-12-29 DIAGNOSIS — E039 Hypothyroidism, unspecified: Secondary | ICD-10-CM | POA: Insufficient documentation

## 2023-12-29 DIAGNOSIS — I10 Essential (primary) hypertension: Secondary | ICD-10-CM | POA: Insufficient documentation

## 2023-12-29 DIAGNOSIS — D6869 Other thrombophilia: Secondary | ICD-10-CM | POA: Insufficient documentation

## 2023-12-29 DIAGNOSIS — Z7901 Long term (current) use of anticoagulants: Secondary | ICD-10-CM | POA: Insufficient documentation

## 2023-12-29 DIAGNOSIS — R9431 Abnormal electrocardiogram [ECG] [EKG]: Secondary | ICD-10-CM | POA: Diagnosis not present

## 2023-12-29 DIAGNOSIS — I2510 Atherosclerotic heart disease of native coronary artery without angina pectoris: Secondary | ICD-10-CM | POA: Insufficient documentation

## 2023-12-29 DIAGNOSIS — E785 Hyperlipidemia, unspecified: Secondary | ICD-10-CM | POA: Diagnosis not present

## 2023-12-29 DIAGNOSIS — I4819 Other persistent atrial fibrillation: Secondary | ICD-10-CM

## 2023-12-29 DIAGNOSIS — G4733 Obstructive sleep apnea (adult) (pediatric): Secondary | ICD-10-CM | POA: Diagnosis not present

## 2023-12-29 NOTE — Patient Instructions (Addendum)
Cardioversion scheduled for: February. 3rd   - Arrive at the [facility] and go to admitting at 10:30 am   - Do not eat or drink anything after midnight the night prior to your procedure.   - Take all your morning medication (except diabetic medications) with a sip of water prior to arrival.  - You will not be able to drive home after your procedure.    - Do NOT miss any doses of your blood thinner - if you should miss a dose please notify our office immediately.   - If you feel as if you go back into normal rhythm prior to scheduled cardioversion, please notify our office immediately.   If your procedure is canceled in the cardioversion suite you will be charged a cancellation fee.

## 2023-12-29 NOTE — Progress Notes (Signed)
Primary Care Physician: Aliene Beams, MD Primary Cardiologist: Dr Mayford Knife Primary Electrophysiologist: none Referring Physician: Dr Roselyn Reef is a 83 y.o. female with a history of CAD, HTN, OSA, HLD, hypothyroidism, atrial fibrillation who presents for follow up in the [facility]. The patient was initially diagnosed with atrial fibrillation 01/2021 after presenting to the ED with symptoms of intermittent CP. She was in rate controlled afib. Echo showed EF 45-50% with hypokinetic basal segments. Lexiscan did not show ischemia. She was discharged in rate controlled afib with plan for outpatient DCCV but converted spontaneously on her own. Patient is on Eliquis for a CHADS2VASC score of 5.   Patient returns for follow up for atrial fibrillation. Patient was seen in the AF clinic 1/24 and was in rate controlled afib. DCCV was recommended, patient wanted time to consider her options before making a decision. She remains in afib today and has symptoms of feeling "jittery" and fatigued. She denies any missed doses of anticoagulation.   Today, he denies symptoms of palpitations, chest pain, orthopnea, PND, lower extremity edema, dizziness, presyncope, syncope, snoring, daytime somnolence, bleeding, or neurologic sequela. The patient is tolerating medications without difficulties and is otherwise without complaint today.    Atrial Fibrillation Risk Factors:  she does have symptoms or diagnosis of sleep apnea. she does have a history of rheumatic fever. she does not have a history of alcohol use. The patient does not have a history of early familial atrial fibrillation or other arrhythmias.   Atrial Fibrillation Management history:  Previous antiarrhythmic drugs: none Previous cardioversions: none Previous ablations: none Anticoagulation history: Eliquis   Past Medical History:  Diagnosis Date   Benign essential HTN    CAD (coronary artery  disease), native coronary artery    Cath 2012 with 30% LCx; Coronary CTA showed minimal mixed nonobstructive disease.  Less than 25% ostial LAD and left circumflex with coronary calcium score 41.7 on  12/2022   DCM (dilated cardiomyopathy) (HCC)    ? tachy mediated from afib with RVR.  EF 45-50% by echo 01/2021.  Lexiscan myoview with no ischemia   Diverticulosis    Hiatal hernia    HLD (hyperlipidemia)    Persistent atrial fibrillation (HCC)    On DOAC   S/P laparoscopic cholecystectomy 07/28/2011   23 years ago   Thyroid goiter     Current Outpatient Medications  Medication Sig Dispense Refill   albuterol (VENTOLIN HFA) 108 (90 Base) MCG/ACT inhaler Inhale 2 puffs into the lungs every 4 (four) hours as needed for wheezing or shortness of breath.     ALPRAZolam (XANAX) 0.5 MG tablet Take 0.5 mg by mouth daily as needed for anxiety or sleep.     atenolol (TENORMIN) 50 MG tablet Take 1 tablet (50 mg total) by mouth 2 (two) times daily. 180 tablet 3   b complex vitamins tablet Take 1 tablet by mouth daily.  Super     Biotin 40981 MCG TBDP Take 10,000 mcg by mouth daily.     bisacodyl (DULCOLAX) 5 MG EC tablet Take 5 mg by mouth daily as needed for mild constipation or moderate constipation.     CALCIUM PO Take 1 tablet by mouth daily.     diltiazem (CARDIZEM) 60 MG tablet Take 1 tablet (60 mg total) by mouth 2 (two) times daily. 180 tablet 2   ELIQUIS 5 MG TABS tablet TAKE 1 TABLET BY MOUTH TWICE A DAY (Patient taking differently: Take 5  mg by mouth 2 (two) times daily.) 180 tablet 1   furosemide (LASIX) 20 MG tablet Take one tablet (20 mg) as needed for swelling but call the office if taking more than twice a week. 90 tablet 3   hydrochlorothiazide (MICROZIDE) 12.5 MG capsule TAKE 1 CAPSULE BY MOUTH EVERY DAY 90 capsule 3   HYDROcodone-acetaminophen (NORCO/VICODIN) 5-325 MG tablet Take 1 tablet by mouth as needed for severe pain (pain score 7-10).     hydrOXYzine (ATARAX) 25 MG tablet Take  25 mg by mouth daily as needed for itching.     irbesartan (AVAPRO) 300 MG tablet Take 300 mg by mouth every morning.     levothyroxine (SYNTHROID) 25 MCG tablet Take 25 mcg by mouth every morning.     lidocaine-prilocaine (EMLA) cream Apply topically 2 (two) times daily as needed.     Multiple Vitamin (MULTIVITAMIN) tablet Take 1 tablet by mouth daily.     omeprazole (PRILOSEC OTC) 20 MG tablet Take 10 mg by mouth daily.     Polyethyl Glycol-Propyl Glycol (SYSTANE) 0.4-0.3 % SOLN Place 1 drop into both eyes daily as needed (Dry eyes).     promethazine (PHENERGAN) 25 MG tablet Take 25 mg by mouth every 6 (six) hours as needed for nausea or vomiting.     rosuvastatin (CRESTOR) 5 MG tablet TAKE 1 TABLET (5 MG TOTAL) BY MOUTH DAILY. (Patient taking differently: Take 5 mg by mouth at bedtime.) 90 tablet 3   Selenium 100 MCG TABS Take 100 mcg by mouth daily.     valACYclovir (VALTREX) 1000 MG tablet Take 2,000 mg by mouth daily as needed (fever blisters).     vitamin C (ASCORBIC ACID) 500 MG tablet Take 500 mg by mouth daily.     No current facility-administered medications for this encounter.    ROS- All systems are reviewed and negative except as per the HPI above.  Physical Exam: Vitals:   12/29/23 1031  BP: (!) 150/80  Pulse: 70  Weight: 72.3 kg  Height: 5' 1.5" (1.562 m)    GEN: Well nourished, well developed in no acute distress NECK: No JVD; No carotid bruits CARDIAC: Irregularly irregular rate and rhythm, no murmurs, rubs, gallops RESPIRATORY:  Clear to auscultation without rales, wheezing or rhonchi  ABDOMEN: Soft, non-tender, non-distended EXTREMITIES:  No edema; No deformity    Wt Readings from Last 3 Encounters:  12/29/23 72.3 kg  12/25/23 70.7 kg  12/10/23 73.2 kg    EKG today demonstrates  Afib Vent. rate 70 BPM PR interval * ms QRS duration 82 ms QT/QTcB 380/410 ms   Echo 08/13/22 demonstrated   1. Left ventricular ejection fraction, by estimation, is 55 to  60%. The  left ventricle has normal function. The left ventricle has no regional  wall motion abnormalities. Left ventricular diastolic parameters are  consistent with Grade II diastolic dysfunction (pseudonormalization).   2. Right ventricular systolic function is normal. The right ventricular  size is mildly enlarged.   3. The mitral valve is normal in structure. No evidence of mitral valve  regurgitation.   4. The aortic valve is normal in structure. Aortic valve regurgitation is  not visualized.   5. There is mild dilatation of the ascending aorta, measuring 37 mm.   6. The inferior vena cava is normal in size with greater than 50%  respiratory variability, suggesting right atrial pressure of 3 mmHg.   Comparison(s): No significant change from prior study.    Epic records are reviewed at  length today   CHA2DS2-VASc Score = 5  The patient's score is based upon: CHF History: 0 HTN History: 1 Diabetes History: 0 Stroke History: 0 Vascular Disease History: 1 Age Score: 2 Gender Score: 1       ASSESSMENT AND PLAN: Persistent Atrial Fibrillation (ICD10:  I48.19) The patient's CHA2DS2-VASc score is 5, indicating a 7.2% annual risk of stroke.   Patient remains in symptomatic afib. We reviewed the risks and benefits of DCCV and she is agreeable to proceed.  Continue atenolol 50 mg BID Continue diltiazem 60 mg BID (she has difficulty swallowing the capsule form) Patient interested in establishing care with EP, specifically Dr Lalla Brothers.   Secondary Hypercoagulable State (ICD10:  D68.69) The patient is at significant risk for stroke/thromboembolism based upon her CHA2DS2-VASc Score of 5.  Continue Apixaban (Eliquis). No bleeding issues.   HTN Stable on current regimen  OSA  Encouraged nightly CPAP    Follow up with Dr Lalla Brothers post DCCV per patient request.     Informed Consent   Shared Decision Making/Informed Consent The risks (stroke, cardiac arrhythmias rarely  resulting in the need for a temporary or permanent pacemaker, skin irritation or burns and complications associated with conscious sedation including aspiration, arrhythmia, respiratory failure and death), benefits (restoration of normal sinus rhythm) and alternatives of a direct current cardioversion were explained in detail to Ms. Roxan Hockey and she agrees to proceed.        Jorja Loa PA-C [facility] [address] [place], [place] [address] [phone]

## 2023-12-30 LAB — CBC
Hematocrit: 40.2 % (ref 34.0–46.6)
Hemoglobin: 13 g/dL (ref 11.1–15.9)
MCH: 31 pg (ref 26.6–33.0)
MCHC: 32.3 g/dL (ref 31.5–35.7)
MCV: 96 fL (ref 79–97)
Platelets: 294 10*3/uL (ref 150–450)
RBC: 4.19 x10E6/uL (ref 3.77–5.28)
RDW: 12.7 % (ref 11.7–15.4)
WBC: 13.2 10*3/uL — ABNORMAL HIGH (ref 3.4–10.8)

## 2023-12-30 LAB — BASIC METABOLIC PANEL
BUN/Creatinine Ratio: 23 (ref 12–28)
BUN: 24 mg/dL (ref 8–27)
CO2: 23 mmol/L (ref 20–29)
Calcium: 9.3 mg/dL (ref 8.7–10.3)
Chloride: 96 mmol/L (ref 96–106)
Creatinine, Ser: 1.04 mg/dL — ABNORMAL HIGH (ref 0.57–1.00)
Glucose: 99 mg/dL (ref 70–99)
Potassium: 4.6 mmol/L (ref 3.5–5.2)
Sodium: 135 mmol/L (ref 134–144)
eGFR: 54 mL/min/{1.73_m2} — ABNORMAL LOW (ref >59)

## 2023-12-31 ENCOUNTER — Telehealth (HOSPITAL_COMMUNITY): Payer: Self-pay

## 2023-12-31 NOTE — Addendum Note (Signed)
Encounter addended by: Learta Codding, CMA on: 12/31/2023 9:31 AM  Actions taken: Clinical Note Signed

## 2023-12-31 NOTE — Telephone Encounter (Signed)
Patient called to inform us she has Covid. I have rescheduled her Cardioversion from 2/3 to 2/11. Patient given information regarding her Cardioversion and she was told not to miss her blood thinner. Communicated with patient and she verbalized understanding.

## 2023-12-31 NOTE — Telephone Encounter (Signed)
Changed date from February 3rd to February 10 th for Cardioversion.

## 2024-01-08 NOTE — Progress Notes (Signed)
 Spoke to pt and instructed them to come at 0830 but can't be here until 1000 and to be NPO after 0000.  Confirmed no missed doses of AC and instructed to take in AM with a small sip of water .  Confirmed that pt will have a ride home and someone to stay with them for 24 hours after the procedure. Instructed patient to not wear any jewelry or lotion.

## 2024-01-11 ENCOUNTER — Encounter (HOSPITAL_COMMUNITY)
Admission: RE | Disposition: A | Discharge status: Home / Self Care | Payer: Self-pay | Source: Home / Self Care | Attending: Cardiology

## 2024-01-11 ENCOUNTER — Encounter (HOSPITAL_COMMUNITY): Payer: Self-pay | Admitting: Anesthesiology

## 2024-01-11 ENCOUNTER — Other Ambulatory Visit: Payer: Self-pay

## 2024-01-11 ENCOUNTER — Ambulatory Visit (HOSPITAL_COMMUNITY)
Admission: RE | Admit: 2024-01-11 | Discharge: 2024-01-11 | Disposition: A | Discharge status: Home / Self Care | Payer: PPO | Attending: Cardiology | Admitting: Cardiology

## 2024-01-11 ENCOUNTER — Ambulatory Visit: Payer: PPO

## 2024-01-11 DIAGNOSIS — Z539 Procedure and treatment not carried out, unspecified reason: Secondary | ICD-10-CM | POA: Diagnosis not present

## 2024-01-11 DIAGNOSIS — I4891 Unspecified atrial fibrillation: Secondary | ICD-10-CM | POA: Insufficient documentation

## 2024-01-11 DIAGNOSIS — Z79899 Other long term (current) drug therapy: Secondary | ICD-10-CM | POA: Diagnosis not present

## 2024-01-11 DIAGNOSIS — I4819 Other persistent atrial fibrillation: Secondary | ICD-10-CM

## 2024-01-11 SURGERY — CARDIOVERSION (CATH LAB)
Anesthesia: General

## 2024-01-11 MED ORDER — SODIUM CHLORIDE 0.9% FLUSH: 3.0000 mL | Freq: Two times a day (BID) | INTRAVENOUS | Status: DC

## 2024-01-11 MED ORDER — SODIUM CHLORIDE 0.9% FLUSH: 3.0000 mL | INTRAVENOUS | Status: DC | PRN

## 2024-01-11 NOTE — Telephone Encounter (Signed)
 Per preop APP the pt's tele preop appt for today needs to be cancelled as she has been scheduled for DCCV (cardioversion) today as well.   I tried to call the pt and was able to leave a partial message before her machine cut me off. We will reach out to the pt tomorrow to reschedule preop appt. I will update the surgeon as well.

## 2024-01-11 NOTE — Progress Notes (Addendum)
 Presented today for cardioversion for Afib.  Very bradycardic on presentation, checked EKG and in junctional rhythm (vs Afib) with rate 39 bpm.  She is on atenolol  and diltizem at home, took both this morning.  BP stable.  Reports feeling fatigued.  Will hold off on cardioversion and discontinue diltiazem , and recommend close f/u in Afib clinic  Wendie Hamburg, MD

## 2024-01-11 NOTE — Progress Notes (Signed)
 Evaluation Performed:  Preoperative cardiovascular risk assessment _____________   Date:  01/11/2024   Patient ID:  Tabitha Murphy, DOB 1941-03-02, MRN [id number] Patient Location:  Home Provider location:   Office  Primary Care Provider:  Dorena Gander, MD Primary Cardiologist:  Gaylyn Keas, MD  Chief Complaint / Patient Profile   83 y.o. y/o female with a h/o  who is pending  and presents today for telephonic preoperative cardiovascular risk assessment.  History of Present Illness    Tabitha Murphy is a 83 y.o. female who presents via audio/video conferencing for a telehealth visit today.  Pt was last seen in cardiology clinic on  by .  At that time Tabitha Murphy was doing well .  The patient is now pending procedure as outlined above. Since her last visit, she   Past Medical History    Past Medical History:  Diagnosis Date   Benign essential HTN    CAD (coronary artery disease), native coronary artery    Cath 2012 with 30% LCx; Coronary CTA showed minimal mixed nonobstructive disease.  Less than 25% ostial LAD and left circumflex with coronary calcium  score 41.7 on  12/2022   DCM (dilated cardiomyopathy) (HCC)    ? tachy mediated from afib with RVR.  EF 45-50% by echo 01/2021.  Lexiscan  myoview  with no ischemia   Diverticulosis    Hiatal hernia    HLD (hyperlipidemia)    Persistent atrial fibrillation (HCC)    On DOAC   S/P laparoscopic cholecystectomy 07/28/2011   23 years ago   Thyroid  goiter    Past Surgical History:  Procedure Laterality Date   BALLOON DILATION N/A 03/24/2023   Procedure: BALLOON DILATION;  Surgeon: Genell Ken, MD;  Location: [facility];  Service: Gastroenterology;  Laterality: N/A;   child birth     x3   CHOLECYSTECTOMY     ESOPHAGEAL MANOMETRY N/A 07/22/2023   Procedure: ESOPHAGEAL MANOMETRY (EM);  Surgeon: Baldo Bonds, MD;  Location: [facility];  Service: Gastroenterology;  Laterality: N/A;   ESOPHAGOGASTRODUODENOSCOPY (EGD) WITH  PROPOFOL  N/A 03/24/2023   Procedure: ESOPHAGOGASTRODUODENOSCOPY (EGD) WITH PROPOFOL ;  Surgeon: Genell Ken, MD;  Location: [facility];  Service: Gastroenterology;  Laterality: N/A;    Allergies  Allergies  Allergen Reactions   Oxycodone Hcl Other (See Comments)    Can take with promethazine    Amlodipine Besylate     Other reaction(s): swelling along with aches and pains   Codeine     REACTION: nausea   [place] Control     Other reaction(s): skin around mouth comes off   Cymbalta  [Duloxetine  Hcl]     Other reaction(s): Heaviness and pains in chest and back/dry mouth   Doxazosin Mesylate     Other reaction(s): SOB   Duloxetine  Diarrhea   Gabapentin     Other reaction(s): causes aches and pains   Lyrica  [Pregabalin ] Other (See Comments)    Made pain in legs and feet worse.   Rosuvastatin  Calcium      Other reaction(s): knee and leg pains   Zolpidem Tartrate     Other reaction(s): retrograde amnesia    Home Medications    Prior to Admission medications   Medication Sig Start Date End Date Taking? Authorizing Provider  albuterol  (VENTOLIN  HFA) 108 (90 Base) MCG/ACT inhaler Inhale 2 puffs into the lungs every 4 (four) hours as needed for wheezing or shortness of breath. 03/18/20   [provider]  ALPRAZolam  (XANAX ) 0.5 MG tablet Take 0.5 mg by  mouth daily as needed for anxiety or sleep.    [provider]  atenolol  (TENORMIN ) 50 MG tablet Take 1 tablet (50 mg total) by mouth 2 (two) times daily. 07/21/23   Walker, Caitlin S, NP  b complex vitamins tablet Take 1 tablet by mouth daily.  Super    [provider]  Biotin 16109 MCG TBDP Take 10,000 mcg by mouth daily.    [provider]  CALCIUM  PO Take 1 tablet by mouth daily.    [provider]  diltiazem  (CARDIZEM ) 60 MG tablet Take 1 tablet (60 mg total) by mouth 2 (two) times daily. 05/05/23 05/04/24  Fenton, Clint R, PA  ELIQUIS  5 MG TABS tablet TAKE 1 TABLET BY MOUTH TWICE A  DAY Patient taking differently: Take 5 mg by mouth 2 (two) times daily. 03/04/23   Jacqueline Matsu, MD  furosemide  (LASIX ) 20 MG tablet Take one tablet (20 mg) as needed for swelling but call the office if taking more than twice a week. 09/11/22   Clearnce Curia, NP  hydrochlorothiazide  (MICROZIDE ) 12.5 MG capsule TAKE 1 CAPSULE BY MOUTH EVERY DAY 06/25/23   Jacqueline Matsu, MD  HYDROcodone -acetaminophen  (NORCO/VICODIN) 5-325 MG tablet Take 1 tablet by mouth as needed for severe pain (pain score 7-10).    [provider]  hydrOXYzine (ATARAX) 25 MG tablet Take 25 mg by mouth daily as needed for itching. 01/06/23   [provider]  irbesartan  (AVAPRO ) 300 MG tablet Take 300 mg by mouth every morning. 02/02/21   [provider]  levothyroxine  (SYNTHROID ) 25 MCG tablet Take 25 mcg by mouth every morning. 02/25/21   [provider]  lidocaine -prilocaine  (EMLA ) cream Apply topically 2 (two) times daily as needed. 09/24/23   [provider]  Metamucil Fiber 2 g CHEW Chew 3 each by mouth daily.    [provider]  Multiple Vitamin (MULTIVITAMIN) tablet Take 1 tablet by mouth daily.    [provider]  omeprazole  (PRILOSEC  OTC) 20 MG tablet Take 20 mg by mouth daily.    [provider]  Polyethyl Glycol-Propyl Glycol (SYSTANE) 0.4-0.3 % SOLN Place 1 drop into both eyes daily as needed (Dry eyes).    [provider]  promethazine (PHENERGAN) 25 MG tablet Take 25 mg by mouth every 6 (six) hours as needed for nausea or vomiting.    [provider]  rosuvastatin  (CRESTOR ) 5 MG tablet TAKE 1 TABLET (5 MG TOTAL) BY MOUTH DAILY. Patient taking differently: Take 5 mg by mouth at bedtime. 02/20/23   Jacqueline Matsu, MD  Selenium 100 MCG TABS Take 100 mcg by mouth daily.    [provider]  valACYclovir (VALTREX) 1000 MG tablet Take 2,000 mg by mouth 2 (two) times daily as needed (fever blisters).    [provider]   vitamin C (ASCORBIC ACID) 500 MG tablet Take 500 mg by mouth daily.    [provider]    Physical Exam    Vital Signs:  Tabitha Murphy does not have vital signs available for review today.  Given telephonic nature of communication, physical exam is limited. AAOx3. NAD. Normal affect.  Speech and respirations are unlabored.  Accessory Clinical Findings    None  Assessment & Plan    1.  Preoperative Cardiovascular Risk Assessment:  The patient was advised that if she develops new symptoms prior to surgery to contact our office to arrange for a follow-up visit, and she verbalized understanding.  A copy of this note will be routed to requesting surgeon.  Time:   Today, I have spent  minutes with the patient with telehealth technology discussing medical history, symptoms, and management plan.     Shaquayla Klimas, Leilani Punter, NP  01/11/2024, 4:44 PM This encounter was created in error - please disregard.

## 2024-01-18 NOTE — Telephone Encounter (Addendum)
I d/w preop APP Carlos Levering, NP ok to add need preop clearance to appt notes for appt with Dr. Lalla Brothers 02/01/24.     Per Carlos Levering, NP : I would want him to weigh in because when she presented for cardioversion she was very bradycardic and it was canceled so it would be important that she get cleared from an EP standpoint.      I will update all parties involved.

## 2024-01-21 ENCOUNTER — Telehealth: Payer: Self-pay

## 2024-01-21 NOTE — Telephone Encounter (Signed)
Called patient to make an office appt for a pre-op clearance, no answer left a vm

## 2024-01-21 NOTE — Telephone Encounter (Signed)
   Pre-operative Risk Assessment    Patient Name: Tabitha Murphy  DOB: Dec 02, 1940 MRN: [id number]   Date of last office visit: 07/21/2023 [name] Date of next office visit: 02/01/2024 Dr. Lalla Brothers   Request for Surgical Clearance    Procedure:   Left total hip arthroplasty  Date of Surgery:  Clearance 04/20/24                                Surgeon:  Dr. Trudee Grip Surgeon's Group or Practice Name:  [facility] Phone number:  [phone] Fax number:  [phone]   Type of Clearance Requested:   - Medical  - Pharmacy:  Hold Apixaban (Eliquis)     Type of Anesthesia:   Choice   Additional requests/questions:    Signed, Bettymae Yott   01/21/2024, 2:20 PM

## 2024-01-21 NOTE — Telephone Encounter (Signed)
   Name: GIORGIA WAHLER  DOB: May 16, 1941  MRN: [id number]  Primary Cardiologist: Armanda Magic, MD  Chart reviewed as part of pre-operative protocol coverage. Because of Keyanna C Whitis's past medical history and time since last visit, she will require a follow-up in-office visit in order to better assess preoperative cardiovascular risk. Patient recently hospitalized for cardiology issue of bradycardia. Is due to see EP post hospital on 02/01/2024. They do not do cardiac clearances. Last seen in the office on 07/21/2023 but was hospitalized and seen by cardiology on 01/11/2024.  Pre-op covering staff: - Please schedule appointment and call patient to inform them. If patient already had an upcoming appointment within acceptable timeframe, please add "pre-op clearance" to the appointment notes so provider is aware. - Please contact requesting surgeon's office via preferred method (i.e, phone, fax) to inform them of need for appointment prior to surgery.  This message will also be routed to pharmacy pool and/or Dr Mayford Knife for input on holding Eliquis as requested below so that this information is available to the clearing provider at time of patient's appointment.   Joni Reining, NP  01/21/2024, 2:35 PM

## 2024-01-22 NOTE — Telephone Encounter (Signed)
Called patient to set up an appt for a pre-op clearance. Patient mention she has an appt with Dr. Lalla Brothers on 02/01/24. Informed patient that Dr. Lalla Brothers can do you clearance that day. Patient understood.

## 2024-01-27 ENCOUNTER — Ambulatory Visit (HOSPITAL_COMMUNITY): Payer: PPO | Admitting: Physician Assistant

## 2024-01-27 ENCOUNTER — Other Ambulatory Visit: Payer: Self-pay | Admitting: Physician Assistant

## 2024-01-31 NOTE — Progress Notes (Unsigned)
 Electrophysiology Office Note:    Date:  02/01/2024   ID:  Tabitha Murphy, DOB January 03, 1941, MRN [id number]  [facility] Cardiologist:  Armanda Magic, MD  [facility] Electrophysiologist:  Lanier Prude, MD   Referring MD: Danice Goltz, PA   Chief Complaint: AF  History of Present Illness:    [name] is an 83 year old woman who I am seeing today for an evaluation of atrial fibrillation at the request of Tabitha Loa, PA-C. The patient saw [facility] December 29, 2023.  The patient has a history of coronary artery disease, hypertension, sleep apnea, hyperlipidemia, hypothyroidism.  The patient's diagnosis of atrial fibrillation was made in March 2022 when she presented to the emergency department with chest pain.  Outpatient cardioversion was planned but she converted to sinus rhythm before the cardioversion was performed.  She is on Eliquis for stroke prophylaxis.  She feels "jittery" and fatigued while in atrial fibrillation.  She takes atenolol.  She was previously on diltiazem but this was stopped.  She tells me that she can definitely tell that she is out of rhythm.  She feels fatigued, dizzy and off balance.  She thinks she was most recently in normal rhythm in November 2024.  She takes her Eliquis religiously.  She has done some research about atrial fibrillation ablation.  She goes to the [facility] 5 days a week.     Their past medical, social and family history was reviewed.   ROS:   Please see the history of present illness.    All other systems reviewed and are negative.  EKGs/Labs/Other Studies Reviewed:    The following studies were reviewed today:  February 18, 2023 ZIO monitor Heart rate 46-1 08, average 64 A-fib is present  December 29, 2022 CT coronary  August 13, 2022 echo EF 55-60 RV normal No significant valvular abnormalities  January 11, 2024 EKG Junctional bradycardia versus A-fib with complete heart block and junctional escape, heart rate 39  bpm  December 29, 2023 EKG shows atrial fibrillation with a ventricular rate of 70 bpm     EKG Interpretation Date/Time:  Monday February 01 2024 10:47:39 EST Ventricular Rate:  88 PR Interval:    QRS Duration:  80 QT Interval:  362 QTC Calculation: 438 R Axis:   28  Text Interpretation: Atrial fibrillation Confirmed by Steffanie Dunn [phone]) on 02/01/2024 10:51:03 AM    Physical Exam:    VS:  BP 128/72   Pulse 88   Ht 5' 1.5" (1.562 m)   Wt 160 lb 9.6 oz (72.8 kg)   SpO2 97%   BMI 29.85 kg/m     Wt Readings from Last 3 Encounters:  02/01/24 160 lb 9.6 oz (72.8 kg)  12/29/23 159 lb 6.4 oz (72.3 kg)  12/25/23 155 lb 12.8 oz (70.7 kg)     GEN: no distress CARD: irregularly irregular, No MRG RESP: No IWOB. CTAB.        ASSESSMENT AND PLAN:    1. Persistent atrial fibrillation (HCC)   2. Essential (primary) hypertension     #Persistent atrial fibrillation Symptomatic Ventricular rates have varied wildly from in the 30s to greater than 100 bpm.  She was previously on nodal blockers but these have been discontinued due to severe bradycardia.  Since stopping the Cardizem, her heart rates have improved.  Given her history of bradycardia, I would recommend a conservative management strategy in avoiding antiarrhythmic drugs.. If rhythm control is necessary moving forward, could consider catheter ablation.  I  think she would be a reasonable candidate. I discussed the risk of cardioversion during today's visit and she wishes to proceed.  She has not missed any doses of blood thinner in the last 4 weeks. Continue Eliquis  #Hypertension At goal today.  Recommend checking blood pressures 1-2 times per week at home and recording the values.  Recommend bringing these recordings to the primary care physician. Continue irbesartan, hydrochlorothiazide, lasix  #Pre Op Risk Stratification Ms. Apple's perioperative risk of a major cardiac event is 0.9% according to the Revised  Cardiac Risk Index (RCRI).  Therefore, she is at low risk for perioperative complications.    According to ACC/AHA guidelines, no further cardiovascular testing needed.  The patient may proceed to surgery at acceptable risk.   Eliquis (Apixaban) can be held for 2 days prior to surgery.  Please resume post op when felt to be safe.   The above preop guidance does not apply for the 4 weeks before cardioversion in 4 weeks after cardioversion.   Follow-up with me in 8 weeks.   Signed, Rossie Muskrat. Lalla Brothers, MD, [facility], [facility] 02/01/2024 11:01 AM    Electrophysiology [facility]

## 2024-02-01 ENCOUNTER — Other Ambulatory Visit: Payer: Self-pay

## 2024-02-01 ENCOUNTER — Encounter: Payer: Self-pay | Admitting: Cardiology

## 2024-02-01 ENCOUNTER — Ambulatory Visit: Payer: PPO | Attending: Cardiology | Admitting: Cardiology

## 2024-02-01 VITALS — BP 128/72 | HR 88 | Ht 61.5 in | Wt 160.6 lb

## 2024-02-01 DIAGNOSIS — I10 Essential (primary) hypertension: Secondary | ICD-10-CM | POA: Diagnosis not present

## 2024-02-01 DIAGNOSIS — I4819 Other persistent atrial fibrillation: Secondary | ICD-10-CM

## 2024-02-01 DIAGNOSIS — G44209 Tension-type headache, unspecified, not intractable: Secondary | ICD-10-CM | POA: Diagnosis not present

## 2024-02-01 DIAGNOSIS — R5383 Other fatigue: Secondary | ICD-10-CM | POA: Diagnosis not present

## 2024-02-01 DIAGNOSIS — R42 Dizziness and giddiness: Secondary | ICD-10-CM | POA: Diagnosis not present

## 2024-02-01 DIAGNOSIS — J45909 Unspecified asthma, uncomplicated: Secondary | ICD-10-CM | POA: Diagnosis not present

## 2024-02-01 DIAGNOSIS — F419 Anxiety disorder, unspecified: Secondary | ICD-10-CM | POA: Diagnosis not present

## 2024-02-01 DIAGNOSIS — E039 Hypothyroidism, unspecified: Secondary | ICD-10-CM | POA: Diagnosis not present

## 2024-02-01 DIAGNOSIS — I4891 Unspecified atrial fibrillation: Secondary | ICD-10-CM | POA: Diagnosis not present

## 2024-02-01 NOTE — Patient Instructions (Signed)
 Medication Instructions:  Your physician recommends that you continue on your current medications as directed. Please refer to the Current Medication list given to you today.  *If you need a refill on your cardiac medications before your next appointment, please call your pharmacy*  Lab Work: TODAY: BMET and CBC  Testing/Procedures: Cardioversion Your physician has recommended that you have a Cardioversion (DCCV). Electrical Cardioversion uses a jolt of electricity to your heart either through paddles or wired patches attached to your chest. This is a controlled, usually prescheduled, procedure. Defibrillation is done under light anesthesia in the hospital, and you usually go home the day of the procedure. This is done to get your heart back into a normal rhythm. You are not awake for the procedure. Please see the instruction sheet given to you today.  Follow-Up: At [facility], you and your health needs are our priority.  As part of our continuing mission to provide you with exceptional heart care, we have created designated Provider Care Teams.  These Care Teams include your primary Cardiologist (physician) and Advanced Practice Providers (APPs -  Physician Assistants and Nurse Practitioners) who all work together to provide you with the care you need, when you need it.  Your next appointment:   8 weeks  Provider:   Steffanie Dunn, MD

## 2024-02-02 LAB — BASIC METABOLIC PANEL
BUN/Creatinine Ratio: 8 — ABNORMAL LOW (ref 12–28)
BUN: 6 mg/dL — ABNORMAL LOW (ref 8–27)
CO2: 27 mmol/L (ref 20–29)
Calcium: 9.7 mg/dL (ref 8.7–10.3)
Chloride: 90 mmol/L — ABNORMAL LOW (ref 96–106)
Creatinine, Ser: 0.79 mg/dL (ref 0.57–1.00)
Glucose: 108 mg/dL — ABNORMAL HIGH (ref 70–99)
Potassium: 4.3 mmol/L (ref 3.5–5.2)
Sodium: 131 mmol/L — ABNORMAL LOW (ref 134–144)
eGFR: 75 mL/min/{1.73_m2} (ref >59)

## 2024-02-02 LAB — CBC
Hematocrit: 36.3 % (ref 34.0–46.6)
Hemoglobin: 12.4 g/dL (ref 11.1–15.9)
MCH: 31.6 pg (ref 26.6–33.0)
MCHC: 34.2 g/dL (ref 31.5–35.7)
MCV: 93 fL (ref 79–97)
Platelets: 493 10*3/uL — ABNORMAL HIGH (ref 150–450)
RBC: 3.92 x10E6/uL (ref 3.77–5.28)
RDW: 13.1 % (ref 11.7–15.4)
WBC: 9.5 10*3/uL (ref 3.4–10.8)

## 2024-02-05 DIAGNOSIS — Z6830 Body mass index (BMI) 30.0-30.9, adult: Secondary | ICD-10-CM | POA: Diagnosis not present

## 2024-02-05 DIAGNOSIS — M2560 Stiffness of unspecified joint, not elsewhere classified: Secondary | ICD-10-CM | POA: Diagnosis not present

## 2024-02-05 DIAGNOSIS — M25511 Pain in right shoulder: Secondary | ICD-10-CM | POA: Diagnosis not present

## 2024-02-05 DIAGNOSIS — M1991 Primary osteoarthritis, unspecified site: Secondary | ICD-10-CM | POA: Diagnosis not present

## 2024-02-05 DIAGNOSIS — M79641 Pain in right hand: Secondary | ICD-10-CM | POA: Diagnosis not present

## 2024-02-05 DIAGNOSIS — E669 Obesity, unspecified: Secondary | ICD-10-CM | POA: Diagnosis not present

## 2024-02-05 DIAGNOSIS — M2540 Effusion, unspecified joint: Secondary | ICD-10-CM | POA: Diagnosis not present

## 2024-02-05 DIAGNOSIS — M79642 Pain in left hand: Secondary | ICD-10-CM | POA: Diagnosis not present

## 2024-02-05 DIAGNOSIS — M0609 Rheumatoid arthritis without rheumatoid factor, multiple sites: Secondary | ICD-10-CM | POA: Diagnosis not present

## 2024-02-05 DIAGNOSIS — M25552 Pain in left hip: Secondary | ICD-10-CM | POA: Diagnosis not present

## 2024-02-05 DIAGNOSIS — R5383 Other fatigue: Secondary | ICD-10-CM | POA: Diagnosis not present

## 2024-02-05 DIAGNOSIS — M25559 Pain in unspecified hip: Secondary | ICD-10-CM | POA: Diagnosis not present

## 2024-02-05 NOTE — Progress Notes (Signed)
 Called patient with pre-procedure instructions for tomorrow.   Patient informed of:   Time to arrive for procedure. 1000 Remain NPO past midnight.  Must have a ride home and a responsible adult to remain with them for 24 hours post procedure.  Confirmed blood thinner. Eliquis Confirmed no breaks in taking blood thinner for 3+ weeks prior to procedure. Confirmed patient stopped all GLP-1s and GLP-2s for at least one week before procedure.

## 2024-02-08 ENCOUNTER — Encounter (HOSPITAL_COMMUNITY): Admission: RE | Payer: Self-pay | Source: Home / Self Care

## 2024-02-08 ENCOUNTER — Encounter (HOSPITAL_COMMUNITY): Payer: Self-pay | Admitting: Certified Registered Nurse Anesthetist

## 2024-02-08 ENCOUNTER — Ambulatory Visit (HOSPITAL_COMMUNITY): Admission: RE | Admit: 2024-02-08 | Source: Home / Self Care | Admitting: Cardiovascular Disease

## 2024-02-08 DIAGNOSIS — Z01818 Encounter for other preprocedural examination: Secondary | ICD-10-CM

## 2024-02-08 SURGERY — CARDIOVERSION (CATH LAB)
Anesthesia: General

## 2024-02-10 ENCOUNTER — Telehealth: Payer: Self-pay

## 2024-02-10 NOTE — Telephone Encounter (Signed)
-----   Message from Nurse Sherlynn Stalls sent at 02/08/2024 12:36 PM EDT ----- Regarding: Reschedule Tabitha Murphy was scheduled for CV today.  She said she called the MD on call to report you had a respiratory illness.  Please schedule when she is feeling better   Thanks  Victorino Dike

## 2024-02-10 NOTE — Telephone Encounter (Signed)
 Left message for patient to call back

## 2024-02-15 ENCOUNTER — Telehealth: Payer: Self-pay | Admitting: Cardiology

## 2024-02-15 NOTE — Telephone Encounter (Signed)
 Spoke with the patient and rescheduled her cardioversion for 3/31 at 11:00am. Patient is aware that she needs to arrive at 10:00am. I will mail her a new instruction letter.

## 2024-02-15 NOTE — Telephone Encounter (Signed)
 Patient would like to reschedule her procedure. Patient requested if possible, she would like to reschedule for February 29, 2024 due to transportation reasons. Please advise.

## 2024-02-15 NOTE — Telephone Encounter (Signed)
 Error

## 2024-02-18 ENCOUNTER — Telehealth: Payer: Self-pay | Admitting: Cardiology

## 2024-02-18 NOTE — Telephone Encounter (Signed)
 Pt wants to discuss her procedure. She has been reading up on it and read that you can die from it. Now she is concerned about having it.

## 2024-02-18 NOTE — Telephone Encounter (Signed)
 Spoke with patient and she stated she read a journal from [employer] and [employer] on cardioversion. She states she read that you die from it. Its making her nervous and she would like to know the death rates and percentages. She would like a call back to discuss

## 2024-02-19 NOTE — Telephone Encounter (Signed)
 Scheduled patient an appointment with Canary Brim, NP for Monday 3/24 to discuss cardioversion.

## 2024-02-21 NOTE — Progress Notes (Unsigned)
  Electrophysiology Office Note:   Date:  02/22/2024  ID:  Tabitha Murphy, DOB 07-May-1941, MRN [id number]  Primary Cardiologist: Armanda Magic, MD Primary Heart Failure: None Electrophysiologist: Lanier Prude, MD      History of Present Illness:   Tabitha Murphy is a 83 y.o. female with h/o AF, CAD, HTN, HLD, OSA, & hypothyroidism seen today for routine electrophysiology followup.   Since last being seen in our clinic the patient reports she is very fatigued when in AF. She feels she can not do anything. Reports both her hips "are broken" and she needs surgery. She needs surgery on her shoulders too.  She was scheduled for a DCCV in early March 2025 but this was not completed due to bradycardia.  Diltiazem was stopped. She has tolerated "many colonoscopy procedures in the past" without difficulty. She reports she always knows when she is in AF.   She denies chest pain, palpitations, dyspnea, PND, orthopnea, nausea, vomiting, dizziness, syncope, edema, weight gain, or early satiety.   Review of systems complete and found to be negative unless listed in HPI.   EP Information / Studies Reviewed:    EKG is not ordered today. EKG from 02/01/24 reviewed which showed AF 88 bpm      Studies:  ECHO 08/2022 > LVEF 55-60%, normal RV, no significant valvular abnormalities  CT Coronary 12/29/22  Cardiac Monitor 02/18/23 > HR 46-108, ave 64 bpm, AF present    Arrhythmia / AAD AF > initial dx 01/2021   Hx severe bradycardia on nodal blockers    Risk Assessment/Calculations:    CHA2DS2-VASc Score = 5   This indicates a 7.2% annual risk of stroke. The patient's score is based upon: CHF History: 0 HTN History: 1 Diabetes History: 0 Stroke History: 0 Vascular Disease History: 1 Age Score: 2 Gender Score: 1         Physical Exam:   VS:  BP (!) 142/76   Pulse 80   Ht 5' 1.5" (1.562 m)   Wt 158 lb 12.8 oz (72 kg)   SpO2 97%   BMI 29.52 kg/m    Wt Readings from Last 3 Encounters:   02/22/24 158 lb 12.8 oz (72 kg)  02/01/24 160 lb 9.6 oz (72.8 kg)  12/29/23 159 lb 6.4 oz (72.3 kg)     GEN: Well nourished, well developed in no acute distress NECK: No JVD; No carotid bruits CARDIAC: Irregularly irregular rate and rhythm (AF by auscultation), no murmurs, rubs, gallops RESPIRATORY:  Clear to auscultation without rales, wheezing or rhonchi  ABDOMEN: Soft, non-tender, non-distended EXTREMITIES:  No edema; No deformity   ASSESSMENT AND PLAN:    Persistent Atrial Fibrillation CHA2DS2-VASc 5, hx of severe bradycardia on nodal blockers -continue OAC for stroke prophylaxis  -previously recommended for catheter ablation given difficulties nodal blockers  -recommended for DCCV by Dr. Lalla Brothers 02/01/24 at visit  -confirmed no missed doses OAC  -DCCV labs > BMP, CBC  -reviewed risks of procedure with patient > risk associated with anesthesia, possible skin burn, brady/tachyarrhythmias, stroke, and rarely death  -return to Dr. Lalla Brothers for discussion re: ablation > she has hip surgeries scheduled, may elect to defer until after her surgeries  Secondary Hypercoagulable State  -continue Eliquis, dose reviewed and appropriate by wt/Cr  Hypertension  -mildly elevated in clinic    Follow up with Dr. Lalla Brothers  as scheduled 04/04/24    Signed, Canary Brim, NP-C, AGACNP-BC [facility] - Electrophysiology  02/22/2024, 9:52 AM

## 2024-02-21 NOTE — H&P (View-Only) (Signed)
  Electrophysiology Office Note:   Date:  02/22/2024  ID:  Tabitha Murphy, DOB 09-21-1941, MRN [id number]  Primary Cardiologist: Armanda Magic, MD Primary Heart Failure: None Electrophysiologist: Lanier Prude, MD      History of Present Illness:   Tabitha Murphy is a 83 y.o. female with h/o AF, CAD, HTN, HLD, OSA, & hypothyroidism seen today for routine electrophysiology followup.   Since last being seen in our clinic the patient reports she is very fatigued when in AF. She feels she can not do anything. Reports both her hips "are broken" and she needs surgery. She needs surgery on her shoulders too.  She was scheduled for a DCCV in early March 2025 but this was not completed due to bradycardia.  Diltiazem was stopped. She has tolerated "many colonoscopy procedures in the past" without difficulty. She reports she always knows when she is in AF.   She denies chest pain, palpitations, dyspnea, PND, orthopnea, nausea, vomiting, dizziness, syncope, edema, weight gain, or early satiety.   Review of systems complete and found to be negative unless listed in HPI.   EP Information / Studies Reviewed:    EKG is not ordered today. EKG from 02/01/24 reviewed which showed AF 88 bpm      Studies:  ECHO 08/2022 > LVEF 55-60%, normal RV, no significant valvular abnormalities  CT Coronary 12/29/22  Cardiac Monitor 02/18/23 > HR 46-108, ave 64 bpm, AF present    Arrhythmia / AAD AF > initial dx 01/2021   Hx severe bradycardia on nodal blockers    Risk Assessment/Calculations:    CHA2DS2-VASc Score = 5   This indicates a 7.2% annual risk of stroke. The patient's score is based upon: CHF History: 0 HTN History: 1 Diabetes History: 0 Stroke History: 0 Vascular Disease History: 1 Age Score: 2 Gender Score: 1         Physical Exam:   VS:  BP (!) 142/76   Pulse 80   Ht 5' 1.5" (1.562 m)   Wt 158 lb 12.8 oz (72 kg)   SpO2 97%   BMI 29.52 kg/m    Wt Readings from Last 3 Encounters:   02/22/24 158 lb 12.8 oz (72 kg)  02/01/24 160 lb 9.6 oz (72.8 kg)  12/29/23 159 lb 6.4 oz (72.3 kg)     GEN: Well nourished, well developed in no acute distress NECK: No JVD; No carotid bruits CARDIAC: Irregularly irregular rate and rhythm (AF by auscultation), no murmurs, rubs, gallops RESPIRATORY:  Clear to auscultation without rales, wheezing or rhonchi  ABDOMEN: Soft, non-tender, non-distended EXTREMITIES:  No edema; No deformity   ASSESSMENT AND PLAN:    Persistent Atrial Fibrillation CHA2DS2-VASc 5, hx of severe bradycardia on nodal blockers -continue OAC for stroke prophylaxis  -previously recommended for catheter ablation given difficulties nodal blockers  -recommended for DCCV by Dr. Lalla Brothers 02/01/24 at visit  -confirmed no missed doses OAC  -DCCV labs > BMP, CBC  -reviewed risks of procedure with patient > risk associated with anesthesia, possible skin burn, brady/tachyarrhythmias, stroke, and rarely death  -return to Dr. Lalla Brothers for discussion re: ablation > she has hip surgeries scheduled, may elect to defer until after her surgeries  Secondary Hypercoagulable State  -continue Eliquis, dose reviewed and appropriate by wt/Cr  Hypertension  -mildly elevated in clinic    Follow up with Dr. Lalla Brothers  as scheduled 04/04/24    Signed, Canary Brim, NP-C, AGACNP-BC [facility] - Electrophysiology  02/22/2024, 9:52 AM

## 2024-02-22 ENCOUNTER — Encounter: Payer: Self-pay | Admitting: Pulmonary Disease

## 2024-02-22 ENCOUNTER — Ambulatory Visit: Attending: Pulmonary Disease | Admitting: Pulmonary Disease

## 2024-02-22 VITALS — BP 142/76 | HR 80 | Ht 61.5 in | Wt 158.8 lb

## 2024-02-22 DIAGNOSIS — I10 Essential (primary) hypertension: Secondary | ICD-10-CM

## 2024-02-22 DIAGNOSIS — D6869 Other thrombophilia: Secondary | ICD-10-CM

## 2024-02-22 DIAGNOSIS — I4819 Other persistent atrial fibrillation: Secondary | ICD-10-CM

## 2024-02-22 LAB — CBC

## 2024-02-22 NOTE — Patient Instructions (Signed)
 Medication Instructions:  Your physician recommends that you continue on your current medications as directed. Please refer to the Current Medication list given to you today.  *If you need a refill on your cardiac medications before your next appointment, please call your pharmacy*  Lab Work: BMET, CBC-TODAY If you have labs (blood work) drawn today and your tests are completely normal, you will receive your results only by: MyChart Message (if you have MyChart) OR A paper copy in the mail If you have any lab test that is abnormal or we need to change your treatment, we will call you to review the results.  Testing/Procedures: Cardioversion 02/29/24 as previously scheduled  Follow-Up: At [facility], you and your health needs are our priority.  As part of our continuing mission to provide you with exceptional heart care, we have created designated Provider Care Teams.  These Care Teams include your primary Cardiologist (physician) and Advanced Practice Providers (APPs -  Physician Assistants and Nurse Practitioners) who all work together to provide you with the care you need, when you need it.  Your next appointment:   As scheduled  Provider:   Steffanie Dunn, MD

## 2024-02-23 ENCOUNTER — Telehealth: Payer: Self-pay | Admitting: Cardiology

## 2024-02-23 LAB — CBC
Hematocrit: 37.5 % (ref 34.0–46.6)
Hemoglobin: 12.7 g/dL (ref 11.1–15.9)
MCH: 31.6 pg (ref 26.6–33.0)
MCHC: 33.9 g/dL (ref 31.5–35.7)
MCV: 93 fL (ref 79–97)
Platelets: 385 10*3/uL (ref 150–450)
RBC: 4.02 x10E6/uL (ref 3.77–5.28)
RDW: 13.3 % (ref 11.7–15.4)
WBC: 9.1 10*3/uL (ref 3.4–10.8)

## 2024-02-23 LAB — BASIC METABOLIC PANEL
BUN/Creatinine Ratio: 17 (ref 12–28)
BUN: 13 mg/dL (ref 8–27)
CO2: 25 mmol/L (ref 20–29)
Calcium: 9.7 mg/dL (ref 8.7–10.3)
Chloride: 92 mmol/L — ABNORMAL LOW (ref 96–106)
Creatinine, Ser: 0.78 mg/dL (ref 0.57–1.00)
Glucose: 127 mg/dL — ABNORMAL HIGH (ref 70–99)
Potassium: 4.5 mmol/L (ref 3.5–5.2)
Sodium: 132 mmol/L — ABNORMAL LOW (ref 134–144)
eGFR: 76 mL/min/{1.73_m2} (ref >59)

## 2024-02-23 NOTE — Telephone Encounter (Signed)
 Spoke with patient and she states she has cardio version scheduled on  3/31. She would like to know will she have to wait a long period time to have knee surgery after her cardioversion.

## 2024-02-23 NOTE — Telephone Encounter (Signed)
 Pt is requesting a callback regarding her wanting to know if her upcoming ablation procedure on 3/31 will interfere with her having her him replacement surgery in May. She stated she's had this scheduled for 3 months now so she just wants to be sure she'll still be able to have it done. Please advise

## 2024-02-26 NOTE — Telephone Encounter (Signed)
 Spoke with patient and she aware she will be on anticoagulation for 4 weeks after cardioversion. She will not be able to have knee surgery until after 03/31/2024. She states her surgery is May 21/25

## 2024-02-26 NOTE — Progress Notes (Signed)
 Pt called for pre procedure instructions. Message left on identified answering machine. Arrival time 1000 NPO after midnight explained Instructed to take am meds with sip of water and confirmed blood thinner consistency. Instructed pt need for ride home tomorrow and have responsible adult with them for 24 hrs post procedure.

## 2024-02-28 NOTE — Anesthesia Preprocedure Evaluation (Signed)
 Anesthesia Evaluation  Patient identified by MRN, date of birth, ID band Patient awake    Reviewed: Allergy & Precautions, NPO status , Patient's Chart, lab work & pertinent test results  Airway Mallampati: III  TM Distance: >3 FB Neck ROM: Full    Dental  (+) Dental Advisory Given, Teeth Intact, Implants   Pulmonary asthma , sleep apnea and Continuous Positive Airway Pressure Ventilation    Pulmonary exam normal breath sounds clear to auscultation       Cardiovascular hypertension, Pt. on medications + CAD  Normal cardiovascular exam Rhythm:Regular Rate:Normal  Stress Test 12/2022   The study is normal. The study is low risk.   No ST deviation was noted.   Left ventricular function is normal. Nuclear stress EF: 58 %. The left ventricular ejection fraction is normal (55-65%). End diastolic cavity size is normal.   Prior study available for comparison from 02/16/2021.   Echo 08/2022  1. Left ventricular ejection fraction, by estimation, is 55 to 60%. The left ventricle has normal function. The left ventricle has no regional wall motion abnormalities. Left ventricular diastolic parameters are consistent with Grade II diastolic dysfunction (pseudonormalization).   2. Right ventricular systolic function is normal. The right ventricular size is mildly enlarged.   3. The mitral valve is normal in structure. No evidence of mitral valve regurgitation.   4. The aortic valve is normal in structure. Aortic valve regurgitation is not visualized.   5. There is mild dilatation of the ascending aorta, measuring 37 mm.   6. The inferior vena cava is normal in size with greater than 50% respiratory variability, suggesting right atrial pressure of 3 mmHg.     Neuro/Psych negative neurological ROS     GI/Hepatic Neg liver ROS, hiatal hernia,,,  Endo/Other  negative endocrine ROS    Renal/GU negative Renal ROS     Musculoskeletal negative  musculoskeletal ROS (+)    Abdominal   Peds  Hematology negative hematology ROS (+)   Anesthesia Other Findings   Reproductive/Obstetrics                             Anesthesia Physical Anesthesia Plan  ASA: 3  Anesthesia Plan: General   Post-op Pain Management: Minimal or no pain anticipated   Induction: Intravenous  PONV Risk Score and Plan: 3 and Treatment may vary due to age or medical condition, TIVA and Propofol infusion  Airway Management Planned: Natural Airway  Additional Equipment:   Intra-op Plan:   Post-operative Plan:   Informed Consent: I have reviewed the patients History and Physical, chart, labs and discussed the procedure including the risks, benefits and alternatives for the proposed anesthesia with the patient or authorized representative who has indicated his/her understanding and acceptance.     Dental advisory given  Plan Discussed with: CRNA  Anesthesia Plan Comments:         Anesthesia Quick Evaluation

## 2024-02-29 ENCOUNTER — Ambulatory Visit (HOSPITAL_COMMUNITY): Payer: Self-pay | Admitting: Anesthesiology

## 2024-02-29 ENCOUNTER — Ambulatory Visit (HOSPITAL_COMMUNITY)
Admission: RE | Admit: 2024-02-29 | Discharge: 2024-02-29 | Disposition: A | Discharge status: Home / Self Care | Attending: Cardiovascular Disease | Admitting: Cardiovascular Disease

## 2024-02-29 ENCOUNTER — Encounter (HOSPITAL_COMMUNITY): Payer: Self-pay | Admitting: Cardiovascular Disease

## 2024-02-29 ENCOUNTER — Encounter (HOSPITAL_COMMUNITY)
Admission: RE | Disposition: A | Discharge status: Home / Self Care | Payer: Self-pay | Source: Home / Self Care | Attending: Cardiovascular Disease

## 2024-02-29 ENCOUNTER — Other Ambulatory Visit: Payer: Self-pay

## 2024-02-29 DIAGNOSIS — J45909 Unspecified asthma, uncomplicated: Secondary | ICD-10-CM | POA: Diagnosis not present

## 2024-02-29 DIAGNOSIS — I2510 Atherosclerotic heart disease of native coronary artery without angina pectoris: Secondary | ICD-10-CM

## 2024-02-29 DIAGNOSIS — I4819 Other persistent atrial fibrillation: Secondary | ICD-10-CM | POA: Diagnosis not present

## 2024-02-29 DIAGNOSIS — D6869 Other thrombophilia: Secondary | ICD-10-CM | POA: Diagnosis not present

## 2024-02-29 DIAGNOSIS — I10 Essential (primary) hypertension: Secondary | ICD-10-CM | POA: Diagnosis not present

## 2024-02-29 DIAGNOSIS — G4733 Obstructive sleep apnea (adult) (pediatric): Secondary | ICD-10-CM | POA: Insufficient documentation

## 2024-02-29 DIAGNOSIS — Z7901 Long term (current) use of anticoagulants: Secondary | ICD-10-CM | POA: Diagnosis not present

## 2024-02-29 DIAGNOSIS — Z79899 Other long term (current) drug therapy: Secondary | ICD-10-CM | POA: Diagnosis not present

## 2024-02-29 DIAGNOSIS — E785 Hyperlipidemia, unspecified: Secondary | ICD-10-CM | POA: Diagnosis not present

## 2024-02-29 DIAGNOSIS — E039 Hypothyroidism, unspecified: Secondary | ICD-10-CM | POA: Insufficient documentation

## 2024-02-29 HISTORY — PX: CARDIOVERSION: EP1203

## 2024-02-29 SURGERY — CARDIOVERSION (CATH LAB)
Anesthesia: General

## 2024-02-29 MED ORDER — PROPOFOL 10 MG/ML IV BOLUS
INTRAVENOUS | Status: DC | PRN
  Administered 2024-02-29: 50 mg via INTRAVENOUS

## 2024-02-29 MED ORDER — SODIUM CHLORIDE 0.9% FLUSH: 3.0000 mL | INTRAVENOUS | Status: DC | PRN

## 2024-02-29 MED ORDER — LIDOCAINE HCL (PF) 2 % IJ SOLN
INTRAMUSCULAR | Status: DC | PRN
  Administered 2024-02-29: 50 mg via INTRADERMAL

## 2024-02-29 MED ORDER — SODIUM CHLORIDE 0.9% FLUSH: 3.0000 mL | Freq: Two times a day (BID) | INTRAVENOUS | Status: DC

## 2024-02-29 SURGICAL SUPPLY — 1 items: PAD DEFIB RADIO PHYSIO CONN (PAD) ×2 IMPLANT

## 2024-02-29 NOTE — Anesthesia Postprocedure Evaluation (Signed)
 Anesthesia Post Note  Patient: Tabitha Murphy  Procedure(s) Performed: CARDIOVERSION     Patient location during evaluation: PACU Anesthesia Type: General Level of consciousness: sedated and patient cooperative Pain management: pain level controlled Vital Signs Assessment: post-procedure vital signs reviewed and stable Respiratory status: spontaneous breathing Cardiovascular status: stable Anesthetic complications: no   No notable events documented.  Last Vitals:  Vitals:   02/29/24 1135 02/29/24 1140  BP: 135/69 138/63  Pulse: (!) 54 (!) 59  Resp: 20 18  Temp:    SpO2: 96% 95%    Last Pain:  Vitals:   02/29/24 1125  TempSrc:   PainSc: 0-No pain                 Lewie Loron

## 2024-02-29 NOTE — CV Procedure (Signed)
   DIRECT CURRENT CARDIOVERSION  NAME:  Tabitha Murphy    MRN: [id number] DOB:  15-Apr-1941    ADMIT DATE: 02/29/2024  Indication:  Symptomatic atrial fibrillation   Procedure Note:  The patient signed informed consent.  They have had had therapeutic anticoagulation with eliquis greater than 3 weeks.  Anesthesia was administered by Dr. Twanna Hy.  Adequate airway was maintained throughout and vital followed per protocol.  They were cardioverted x 1 with 200J of biphasic synchronized energy.  They converted to NSR.  There were no apparent complications.  The patient had normal neuro status and respiratory status post procedure with vitals stable as recorded elsewhere.    Follow up: They will continue on current medical therapy and follow up with cardiology as scheduled.  Gerri Spore T. Flora Lipps, MD, [facility]  [facility]  [address] [place], [place] [address] [phone]  11:18 AM

## 2024-02-29 NOTE — Interval H&P Note (Signed)
 History and Physical Interval Note:  02/29/2024 10:54 AM  Tabitha Murphy  has presented today for surgery, with the diagnosis of AFIB.  The various methods of treatment have been discussed with the patient and family. After consideration of risks, benefits and other options for treatment, the patient has consented to  Procedure(s): CARDIOVERSION (N/A) as a surgical intervention.  The patient's history has been reviewed, patient examined, no change in status, stable for surgery.  I have reviewed the patient's chart and labs.  Questions were answered to the patient's satisfaction.    NPO for DCCV. On eliquis >3 weeks. No missed doses.   Gerri Spore T. Flora Lipps, MD, [facility]  [facility]  [address] [place], [place] [address] [phone]  10:55 AM

## 2024-02-29 NOTE — Transfer of Care (Signed)
 Immediate Anesthesia Transfer of Care Note  Patient: Tabitha Murphy  Procedure(s) Performed: CARDIOVERSION  Patient Location: Cath Lab  Anesthesia Type:MAC  Level of Consciousness: drowsy, patient cooperative, and responds to stimulation  Airway & Oxygen Therapy: Patient Spontanous Breathing and Patient connected to face mask oxygen  Post-op Assessment: Report given to RN, Post -op Vital signs reviewed and stable, Patient moving all extremities, Patient moving all extremities X 4, and Patient able to stick tongue midline  Post vital signs: Reviewed and stable  Last Vitals:  Vitals Value Taken Time  BP    Temp    Pulse 74 02/29/24 1105  Resp 15 02/29/24 1105  SpO2 94 % 02/29/24 1105  Vitals shown include unfiled device data.  Last Pain:  Vitals:   02/29/24 1030  TempSrc:   PainSc: 0-No pain         Complications: No notable events documented.

## 2024-03-11 ENCOUNTER — Encounter: Payer: PPO | Admitting: Internal Medicine
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
## 2024-03-30 DIAGNOSIS — M25552 Pain in left hip: Secondary | ICD-10-CM | POA: Diagnosis not present

## 2024-03-30 DIAGNOSIS — Z01818 Encounter for other preprocedural examination: Secondary | ICD-10-CM | POA: Diagnosis not present

## 2024-03-30 DIAGNOSIS — Z23 Encounter for immunization: Secondary | ICD-10-CM | POA: Diagnosis not present

## 2024-04-01 DIAGNOSIS — G4733 Obstructive sleep apnea (adult) (pediatric): Secondary | ICD-10-CM | POA: Diagnosis not present

## 2024-04-03 NOTE — Progress Notes (Addendum)
 You okay Electrophysiology Office Follow up Visit Note:    Date:  04/04/2024   ID:  Tabitha Murphy, DOB 22-Nov-1941, MRN [id number]  PCP:  Tabitha Gander, MD  [facility] Cardiologist:  Gaylyn Keas, MD  [facility] Electrophysiologist:  Boyce Byes, MD    Interval History:     Tabitha Murphy is a 83 y.o. female who presents for a follow up visit.   I last saw the patient February 01, 2024 for persistent atrial fibrillation.  Her atrial fibrillation course has been complicated by tachycardia and bradycardia.  We plan for cardioversion and if we continue to experience difficulty controlling her rhythm and symptoms, favored catheter ablation over medications in an effort to avoid significant bradycardia.  The patient last saw Tabitha Murphy in clinic February 22, 2024.  She underwent cardioversion on February 29, 2024.  She said she felt much better after the cardioversion for 2 weeks and then she went back in atrial fibrillation.  She says the atrial fibrillation causes significant distress.  She feels the palpitations, significant fatigue.  She is interested in restoring sinus rhythm long-term.  She has a upcoming left hip replacement followed by the right hip 3 months after.      Past medical, surgical, social and family history were reviewed.  ROS:   Please see the history of present illness.    All other systems reviewed and are negative.  EKGs/Labs/Other Studies Reviewed:    The following studies were reviewed today:     EKG Interpretation Date/Time:  Monday Apr 04 2024 10:49:57 EDT Ventricular Rate:  68 PR Interval:    QRS Duration:  80 QT Interval:  390 QTC Calculation: 414 R Axis:   48  Text Interpretation: Atrial fibrillation When compared with ECG of 29-Feb-2024 11:23, Atrial fibrillation has replaced Junctional rhythm Confirmed by Harvie Liner [phone]) on 04/04/2024 11:18:11 AM    Physical Exam:    VS:  BP (!) 156/82   Pulse 68   Ht 5' 1.5" (1.562 m)   Wt 156 lb  (70.8 kg)   SpO2 97%   BMI 29.00 kg/m     Wt Readings from Last 3 Encounters:  04/04/24 156 lb (70.8 kg)  02/22/24 158 lb 12.8 oz (72 kg)  02/01/24 160 lb 9.6 oz (72.8 kg)     GEN: no distress CARD: Irregularly irregular, No MRG RESP: No IWOB. CTAB.      ASSESSMENT:    1. Persistent atrial fibrillation (HCC)   2. Essential (primary) hypertension    PLAN:    In order of problems listed above:  #Persistent atrial fibrillation On Eliquis  for stroke prophylaxis Continue atenolol  I discussed treatment strategies for her atrial fibrillation during today's clinic appointment.  We discussed continued conservative therapy and catheter ablation. Plan for catheter ablation but this will need to be done after she is healed from her hip replacement surgeries.  I will plan to touch base with her in about 6 months to review her progress and schedule.  #Hypertension Above goal today.  She tells me that it has been controlled at home.  I have asked her to go home and check her blood pressures when she is settled and let us  know if they are still elevated.    Recommend checking blood pressures 1-2 times per week at home and recording the values.  Recommend bringing these recordings to the primary care physician. Continue irbesartan , hydrochlorothiazide , atenolol   Follow-up with me in 6 months.  Signed, Harvie Liner, MD,  [facility] 04/04/2024 11:18 AM    Electrophysiology [facility]  ------------------------  ADDENDUM 3:29 PM 04/19/24  Patient is at acceptable risk to undergo orthopaedic surgery. OK to hold OAC for 2-3 days prior to surgery and restart when felt safe from a surgical perspective post op.   Donelda Fujita T. Marven Slimmer, MD, [facility] Cardiac Electrophysiology

## 2024-04-04 ENCOUNTER — Ambulatory Visit: Attending: Cardiology | Admitting: Cardiology

## 2024-04-04 ENCOUNTER — Encounter: Payer: Self-pay | Admitting: Cardiology

## 2024-04-04 VITALS — BP 156/82 | HR 68 | Ht 61.5 in | Wt 156.0 lb

## 2024-04-04 DIAGNOSIS — I10 Essential (primary) hypertension: Secondary | ICD-10-CM | POA: Diagnosis not present

## 2024-04-04 DIAGNOSIS — I4819 Other persistent atrial fibrillation: Secondary | ICD-10-CM | POA: Diagnosis not present

## 2024-04-04 NOTE — Patient Instructions (Signed)
 Medication Instructions:  Your physician recommends that you continue on your current medications as directed. Please refer to the Current Medication list given to you today.  *If you need a refill on your cardiac medications before your next appointment, please call your pharmacy*  Follow-Up: At [facility], you and your health needs are our priority.  As part of our continuing mission to provide you with exceptional heart care, our providers are all part of one team.  This team includes your primary Cardiologist (physician) and Advanced Practice Providers or APPs (Physician Assistants and Nurse Practitioners) who all work together to provide you with the care you need, when you need it.  Your next appointment:   6 months  Provider:   You will see one of the following Advanced Practice Providers on your designated Care Team:   Mertha Abrahams, Kennard Pea [address] G[place], PA-C Suzann Riddle, NP Creighton Doffing, NP

## 2024-04-05 ENCOUNTER — Telehealth: Payer: Self-pay | Admitting: Cardiology

## 2024-04-05 NOTE — Telephone Encounter (Signed)
 Returned call to patient who states that Dr Marven Slimmer did not advise on how much in advance she should be stopping her Eliquis  in preparation for her hip surgery scheduled on 04/20/24. Clearance note in chart shows:  Mares, Annabella Barr  LM 01/22/24  1:28 PM Note Called patient to set up an appt for a pre-op clearance. Patient mention she has an appt with Dr. Marven Slimmer on 02/01/24. Informed patient that Dr. Marven Slimmer can do you clearance that day. Patient understood.      Will route to [place] and pre-op dept.

## 2024-04-05 NOTE — Telephone Encounter (Signed)
 Pt would like to ask a few questions she forget to ask at her appt yesterday. Please advise

## 2024-04-06 NOTE — Telephone Encounter (Signed)
 Pt called back to advise that she also has a cyst on top of her head and wants to know when she get it removed

## 2024-04-07 NOTE — Telephone Encounter (Signed)
 Patient with diagnosis of afib on Eliquis  for anticoagulation.    Procedure: total hip arthroplasty Date of procedure: 04/20/24   CHA2DS2-VASc Score = 5   This indicates a 7.2% annual risk of stroke. The patient's score is based upon: CHF History: 0 HTN History: 1 Diabetes History: 0 Stroke History: 0 Vascular Disease History: 1 Age Score: 2 Gender Score: 1      CrCl 50 ml/min Platelet count 385  Patient has not had an Afib/aflutter ablation within the last 3 months or DCCV within the last 30 days  She underwent cardioversion on February 29, 2024   Per office protocol, patient can hold Eliquis  for 3 days prior to procedure.    **This guidance is not considered finalized until pre-operative APP has relayed final recommendations.**

## 2024-04-07 NOTE — Telephone Encounter (Signed)
 Dr. Marven Slimmer,   You saw this patient on 04/04/2024. Per office protocol, will you please comment on medical clearance for left total hip arthoplasty on 5/21?  Please route your response to P CV DIV Preop. I will communicate with requesting office once you have given recommendations.   Thank you!  Morey Ar, NP

## 2024-04-13 ENCOUNTER — Encounter (HOSPITAL_COMMUNITY): Payer: Self-pay

## 2024-04-13 ENCOUNTER — Other Ambulatory Visit: Payer: Self-pay

## 2024-04-13 ENCOUNTER — Encounter (HOSPITAL_COMMUNITY)
Admission: RE | Admit: 2024-04-13 | Discharge: 2024-04-13 | Disposition: A | Discharge status: Home / Self Care | Source: Ambulatory Visit | Attending: Orthopedic Surgery | Admitting: Orthopedic Surgery

## 2024-04-13 VITALS — BP 145/80 | HR 70 | Temp 98.2°F | Resp 16 | Ht 61.5 in | Wt 154.3 lb

## 2024-04-13 DIAGNOSIS — M1612 Unilateral primary osteoarthritis, left hip: Secondary | ICD-10-CM | POA: Diagnosis not present

## 2024-04-13 DIAGNOSIS — G4730 Sleep apnea, unspecified: Secondary | ICD-10-CM | POA: Insufficient documentation

## 2024-04-13 DIAGNOSIS — I10 Essential (primary) hypertension: Secondary | ICD-10-CM | POA: Insufficient documentation

## 2024-04-13 DIAGNOSIS — I4891 Unspecified atrial fibrillation: Secondary | ICD-10-CM | POA: Diagnosis not present

## 2024-04-13 DIAGNOSIS — I2510 Atherosclerotic heart disease of native coronary artery without angina pectoris: Secondary | ICD-10-CM | POA: Diagnosis not present

## 2024-04-13 DIAGNOSIS — Z01812 Encounter for preprocedural laboratory examination: Secondary | ICD-10-CM | POA: Insufficient documentation

## 2024-04-13 DIAGNOSIS — I420 Dilated cardiomyopathy: Secondary | ICD-10-CM | POA: Diagnosis not present

## 2024-04-13 DIAGNOSIS — Z01818 Encounter for other preprocedural examination: Secondary | ICD-10-CM

## 2024-04-13 HISTORY — DX: Cardiac murmur, unspecified: R01.1

## 2024-04-13 HISTORY — DX: Nausea with vomiting, unspecified: R11.2

## 2024-04-13 HISTORY — DX: Nausea with vomiting, unspecified: Z98.890

## 2024-04-13 HISTORY — DX: Sleep apnea, unspecified: G47.30

## 2024-04-13 HISTORY — DX: Unspecified osteoarthritis, unspecified site: M19.90

## 2024-04-13 HISTORY — DX: Other complications of anesthesia, initial encounter: T88.59XA

## 2024-04-13 HISTORY — DX: Dyspnea, unspecified: R06.00

## 2024-04-13 HISTORY — DX: Anxiety disorder, unspecified: F41.9

## 2024-04-13 HISTORY — DX: Prediabetes: R73.03

## 2024-04-13 LAB — BASIC METABOLIC PANEL WITH GFR
Anion gap: 9 (ref 5–15)
BUN: 11 mg/dL (ref 8–23)
CO2: 29 mmol/L (ref 22–32)
Calcium: 9.5 mg/dL (ref 8.9–10.3)
Chloride: 91 mmol/L — ABNORMAL LOW (ref 98–111)
Creatinine, Ser: 0.65 mg/dL (ref 0.44–1.00)
GFR, Estimated: >60 mL/min (ref >60)
Glucose, Bld: 121 mg/dL — ABNORMAL HIGH (ref 70–99)
Potassium: 3.6 mmol/L (ref 3.5–5.1)
Sodium: 129 mmol/L — ABNORMAL LOW (ref 135–145)

## 2024-04-13 LAB — CBC
HCT: 35.7 % — ABNORMAL LOW (ref 36.0–46.0)
Hemoglobin: 11.8 g/dL — ABNORMAL LOW (ref 12.0–15.0)
MCH: 31.1 pg (ref 26.0–34.0)
MCHC: 33.1 g/dL (ref 30.0–36.0)
MCV: 94.2 fL (ref 80.0–100.0)
Platelets: 317 10*3/uL (ref 150–400)
RBC: 3.79 MIL/uL — ABNORMAL LOW (ref 3.87–5.11)
RDW: 12.5 % (ref 11.5–15.5)
WBC: 7.5 10*3/uL (ref 4.0–10.5)
nRBC: 0 % (ref 0.0–0.2)

## 2024-04-13 LAB — SURGICAL PCR SCREEN
MRSA, PCR: NEGATIVE
Staphylococcus aureus: POSITIVE — AB

## 2024-04-13 NOTE — Progress Notes (Addendum)
 Anesthesia Review:  PCP: Dorena Gander  Clearance 03/30/24 in Media Tab  Cardiologist : Harvie Liner 04/04/24- L:OV   PPM/ ICD: Device Orders: Rep Notified:  Chest x-ray : 08/28/23- 1 view  EP Study- 02/29/24  EKG : 04/04/24  Echo : 08/13/22  Stress test: 12/15/22  CT Cors- 12/29/22  Cardiac Cath :   Activity level:  Sleep Study/ CPAP : Fasting Blood Sugar :      / Checks Blood Sugar -- times a day:    Blood Thinner/ Instructions /Last Dose: ASA / Instructions/ Last Dose :    At preop appt reviewed preop instructions with pt several times.  PT voiced understanding.     Eliquis - At time of preop on 04/13/2024 pt has not been given instructions in regards to Eliquis  preop.  Pt states she has made seveal calls and has yet to receive preop instructons.  Instructted pt to call [facility] and Cardiology again on 04/13/24 and preop nurse would call [facility].  PT voiced understanding.  Preop nurse spoke with Harrietta Lime on 04/13/24 at 1153am and made aware pt has not yet received Eliquis  preop instructions and no clearance from Cardiology.  Maudine Sos stated she would start to work on .   Maudine Sos called back and LVMM on 04/13/24 and stated she had called Cardiology and pt is to stop Eliquis  3 days prior to surgery.  Maudine Sos stated on message that she had spoken with pt and instructed.  Maudine Sos also stated that pt told her her other meds for am of surgery had not been addressed about am of surgery.  Preop nurse called pt and reviewed with her again what meds she is to take am of surgery.  PT voiced understanding and pt stated she had receved instructons from [place] at ORtho office in regards to Eliquis .  Sharlyn Deaner aware.    02/29/24- Cardioversion    BMP with Sodium of 129 routed to DR Aluisio on 04/13/24. Camilo Cella [facility] aware.

## 2024-04-13 NOTE — Telephone Encounter (Signed)
 Tabitha Murphy, surgery scheduler for Dr. France Ina called about clearance and recommendations for blood thinner to be held. Dr. Marven Slimmer did clear the pt 02/01/24 for 04/20/24 procedure with recommendations ok to hold Eliquis  x 2 days prior and resume post op once surgeon feels safe from a bleeding standpoint. However, Tabitha Murphy states there are notes from 04/05/24 about holding Eliquis  x 3 days and this seems to be maybe being addressed with the preop APP and DR. Marven Slimmer. Tabitha Murphy, is asking which recommendation for hold time should they go by Eliquis  hold x 2 days or x 3 days.   I assured Tabitha Murphy I will run this past the preop APP for review.

## 2024-04-13 NOTE — Telephone Encounter (Signed)
 I will update the surgeon's office ok to hold Eliquis  x 3 days per preop APP Marlana Silvan, NP.

## 2024-04-13 NOTE — Patient Instructions (Signed)
 SURGICAL WAITING ROOM VISITATION  Patients having surgery or a procedure may have no more than 2 support people in the waiting area - these visitors may rotate.    Children under the age of 40 must have an adult with them who is not the patient.   Visitors with respiratory illnesses are discouraged from visiting and should remain at home.  If the patient needs to stay at the hospital during part of their recovery, the visitor guidelines for inpatient rooms apply. Pre-op nurse will coordinate an appropriate time for 1 support person to accompany patient in pre-op.  This support person may not rotate.    Please refer to the [facility] website for the visitor guidelines for Inpatients (after your surgery is over and you are in a regular room).       Your procedure is scheduled on:  04/20/2024    Report to [facility] Main Entrance    Report to admitting at  0945 AM   Call this number if you have problems the morning of surgery [phone]   Do not eat food :After Midnight.   After Midnight you may have the following liquids until __ 0915____ AM/ DAY OF SURGERY  Water Non-Citrus Juices (without pulp, NO RED-Apple, White grape, White cranberry) Black Coffee (NO MILK/CREAM OR CREAMERS, sugar ok)  Clear Tea (NO MILK/CREAM OR CREAMERS, sugar ok) regular and decaf                             Plain Jell-O (NO RED)                                           Fruit ices (not with fruit pulp, NO RED)                                     Popsicles (NO RED)                                                               Sports drinks like Gatorade (NO RED)                  The day of surgery:  Drink ONE (1) Pre-Surgery Clear Ensure or G2 at  0915AM ( have completed by )  the morning of surgery. Drink in one sitting. Do not sip.  This drink was given to you during your hospital  pre-op appointment visit. Nothing else to drink after completing the  Pre-Surgery Clear Ensure or G2.           If you have questions, please contact your surgeon's office.   FOLLOW BOWEL PREP AND ANY ADDITIONAL PRE OP INSTRUCTIONS YOU RECEIVED FROM YOUR SURGEON'S OFFICE!!!     Oral Hygiene is also important to reduce your risk of infection.                                    Remember - BRUSH YOUR TEETH THE MORNING OF SURGERY  WITH YOUR REGULAR TOOTHPASTE  DENTURES WILL BE REMOVED PRIOR TO SURGERY PLEASE DO NOT APPLY "Poly grip" OR ADHESIVES!!!   Do NOT smoke after Midnight   Stop all vitamins and herbal supplements 7 days before surgery.   Take these medicines the morning of surgery with A SIP OF WATER:  inhalers as usual and brign, atenolol , synthroid , omeprazole    DO NOT TAKE ANY ORAL DIABETIC MEDICATIONS DAY OF YOUR SURGERY  Bring CPAP mask and tubing day of surgery.                              You may not have any metal on your body including hair pins, jewelry, and body piercing             Do not wear make-up, lotions, powders, perfumes/cologne, or deodorant  Do not wear nail polish including gel and S&S, artificial/acrylic nails, or any other type of covering on natural nails including finger and toenails. If you have artificial nails, gel coating, etc. that needs to be removed by a nail salon please have this removed prior to surgery or surgery may need to be canceled/ delayed if the surgeon/ anesthesia feels like they are unable to be safely monitored.   Do not shave  48 hours prior to surgery.               Men may shave face and neck.   Do not bring valuables to the hospital. [facility] IS NOT             RESPONSIBLE   FOR VALUABLES.   Contacts, glasses, dentures or bridgework may not be worn into surgery.   Bring small overnight bag day of surgery.   DO NOT BRING YOUR HOME MEDICATIONS TO THE HOSPITAL. PHARMACY WILL DISPENSE MEDICATIONS LISTED ON YOUR MEDICATION LIST TO YOU DURING YOUR ADMISSION IN THE HOSPITAL!    Patients discharged on the day of surgery will not  be allowed to drive home.  Someone NEEDS to stay with you for the first 24 hours after anesthesia.   Special Instructions: Bring a copy of your healthcare power of attorney and living will documents the day of surgery if you haven't scanned them before.              Please read over the following fact sheets you were given: IF YOU HAVE QUESTIONS ABOUT YOUR PRE-OP INSTRUCTIONS PLEASE CALL [phone]   If you received a COVID test during your pre-op visit  it is requested that you wear a mask when out in public, stay away from anyone that may not be feeling well and notify your surgeon if you develop symptoms. If you test positive for Covid or have been in contact with anyone that has tested positive in the last 10 days please notify you surgeon.      Pre-operative 5 CHG Bath Instructions   You can play a key role in reducing the risk of infection after surgery. Your skin needs to be as free of germs as possible. You can reduce the number of germs on your skin by washing with CHG (chlorhexidine gluconate) soap before surgery. CHG is an antiseptic soap that kills germs and continues to kill germs even after washing.   DO NOT use if you have an allergy to chlorhexidine/CHG or antibacterial soaps. If your skin becomes reddened or irritated, stop using the CHG and notify one of our RNs at [phone].  Please shower with the CHG soap starting 4 days before surgery using the following schedule:     Please keep in mind the following:  DO NOT shave, including legs and underarms, starting the day of your first shower.   You may shave your face at any point before/day of surgery.  Place clean sheets on your bed the day you start using CHG soap. Use a clean washcloth (not used since being washed) for each shower. DO NOT sleep with pets once you start using the CHG.   CHG Shower Instructions:  If you choose to wash your hair and private area, wash first with your normal shampoo/soap.  After you  use shampoo/soap, rinse your hair and body thoroughly to remove shampoo/soap residue.  Turn the water OFF and apply about 3 tablespoons (45 ml) of CHG soap to a CLEAN washcloth.  Apply CHG soap ONLY FROM YOUR NECK DOWN TO YOUR TOES (washing for 3-5 minutes)  DO NOT use CHG soap on face, private areas, open wounds, or sores.  Pay special attention to the area where your surgery is being performed.  If you are having back surgery, having someone wash your back for you may be helpful. Wait 2 minutes after CHG soap is applied, then you may rinse off the CHG soap.  Pat dry with a clean towel  Put on clean clothes/pajamas   If you choose to wear lotion, please use ONLY the CHG-compatible lotions on the back of this paper.     Additional instructions for the day of surgery: DO NOT APPLY any lotions, deodorants, cologne, or perfumes.   Put on clean/comfortable clothes.  Brush your teeth.  Ask your nurse before applying any prescription medications to the skin.      CHG Compatible Lotions   Aveeno Moisturizing lotion  Cetaphil Moisturizing Cream  Cetaphil Moisturizing Lotion  Clairol Herbal Essence Moisturizing Lotion, Dry Skin  Clairol Herbal Essence Moisturizing Lotion, Extra Dry Skin  Clairol Herbal Essence Moisturizing Lotion, Normal Skin  Curel Age Defying Therapeutic Moisturizing Lotion with Alpha Hydroxy  Curel Extreme Care Body Lotion  Curel Soothing Hands Moisturizing Hand Lotion  Curel Therapeutic Moisturizing Cream, Fragrance-Free  Curel Therapeutic Moisturizing Lotion, Fragrance-Free  Curel Therapeutic Moisturizing Lotion, Original Formula  Eucerin Daily Replenishing Lotion  Eucerin Dry Skin Therapy Plus Alpha Hydroxy Crme  Eucerin Dry Skin Therapy Plus Alpha Hydroxy Lotion  Eucerin Original Crme  Eucerin Original Lotion  Eucerin Plus Crme Eucerin Plus Lotion  Eucerin TriLipid Replenishing Lotion  Keri Anti-Bacterial Hand Lotion  Keri Deep Conditioning Original Lotion  Dry Skin Formula Softly Scented  Keri Deep Conditioning Original Lotion, Fragrance Free Sensitive Skin Formula  Keri Lotion Fast Absorbing Fragrance Free Sensitive Skin Formula  Keri Lotion Fast Absorbing Softly Scented Dry Skin Formula  Keri Original Lotion  Keri Skin Renewal Lotion Keri Silky Smooth Lotion  Keri Silky Smooth Sensitive Skin Lotion  Nivea Body Creamy Conditioning Oil  Nivea Body Extra Enriched Teacher, adult education Moisturizing Lotion Nivea Crme  Nivea Skin Firming Lotion  NutraDerm 30 Skin Lotion  NutraDerm Skin Lotion  NutraDerm Therapeutic Skin Cream  NutraDerm Therapeutic Skin Lotion  ProShield Protective Hand Cream  Provon moisturizing lotion

## 2024-04-14 NOTE — Progress Notes (Signed)
 Anesthesia Chart Review   Case: [id number] Date/Time: 04/20/24 1205   Procedure: ARTHROPLASTY, HIP, TOTAL, ANTERIOR APPROACH (Left: Hip)   Anesthesia type: Choice   Pre-op diagnosis: Left Hip Osteoarthritis   Location: [facility] ROOM 09 / [facility]   Surgeons: Liliane Rei, MD       DISCUSSION:83 y.o. never smoker with h/o PONV, HTN, sleep apnea w/cpap, atrial fibrillation, CAD, dilated cardiomyopathy, left hip OA scheduled for above procedure 04/21/2027 with Dr. Liliane Rei.   Pt last seen by cardiology 04/04/2024. Pt will have ablation for a-fib after hip surgery.   Low risk stress test 12/2022.   Pt advised by pharmacy to hold Eliquis  3 days prior to procedure.   Pt reports last does of Eliquis  04/17/2024 6:30am.   VS: BP (!) 145/80   Pulse 70   Temp 36.8 C (Oral)   Resp 16   Ht 5' 1.5" (1.562 m)   Wt 70 kg   SpO2 95%   BMI 28.69 kg/m   PROVIDERS: Dorena Gander, MD is PCP   Cardiologist:  Gaylyn Keas, MD  LABS: Labs reviewed: Acceptable for surgery. (all labs ordered are listed, but only abnormal results are displayed)  Labs Reviewed  SURGICAL PCR SCREEN - Abnormal; Notable for the following components:      Result Value   Staphylococcus aureus POSITIVE (*)    All other components within normal limits  BASIC METABOLIC PANEL WITH GFR - Abnormal; Notable for the following components:   Sodium 129 (*)    Chloride 91 (*)    Glucose, Bld 121 (*)    All other components within normal limits  CBC - Abnormal; Notable for the following components:   RBC 3.79 (*)    Hemoglobin 11.8 (*)    HCT 35.7 (*)    All other components within normal limits  TYPE AND SCREEN     IMAGES:   EKG:   CV: Myocardial Perfusion 12/15/2022   The study is normal. The study is low risk.   No ST deviation was noted.   Left ventricular function is normal. Nuclear stress EF: 58 %. The left ventricular ejection fraction is normal (55-65%). End diastolic cavity size is normal.   Prior study  available for comparison from 02/16/2021.  Echo 08/13/2022 1. Left ventricular ejection fraction, by estimation, is 55 to 60%. The  left ventricle has normal function. The left ventricle has no regional  wall motion abnormalities. Left ventricular diastolic parameters are  consistent with Grade II diastolic  dysfunction (pseudonormalization).   2. Right ventricular systolic function is normal. The right ventricular  size is mildly enlarged.   3. The mitral valve is normal in structure. No evidence of mitral valve  regurgitation.   4. The aortic valve is normal in structure. Aortic valve regurgitation is  not visualized.   5. There is mild dilatation of the ascending aorta, measuring 37 mm.   6. The inferior vena cava is normal in size with greater than 50%  respiratory variability, suggesting right atrial pressure of 3 mmHg.  Past Medical History:  Diagnosis Date   Anxiety    Arthritis    Benign essential HTN    CAD (coronary artery disease), native coronary artery    Cath 2012 with 30% LCx; Coronary CTA showed minimal mixed nonobstructive disease.  Less than 25% ostial LAD and left circumflex with coronary calcium  score 41.7 on  12/2022   Complication of anesthesia    slow to wake up   DCM (dilated cardiomyopathy) (  HCC)    ? tachy mediated from afib with RVR.  EF 45-50% by echo 01/2021.  Lexiscan  myoview  with no ischemia   Diverticulosis    Dyspnea    Heart murmur    Hiatal hernia    HLD (hyperlipidemia)    Persistent atrial fibrillation (HCC)    On DOAC   PONV (postoperative nausea and vomiting)    Pre-diabetes    S/P laparoscopic cholecystectomy 07/28/2011   23 years ago   Sleep apnea    cpap   Thyroid  goiter     Past Surgical History:  Procedure Laterality Date   BALLOON DILATION N/A 03/24/2023   Procedure: BALLOON DILATION;  Surgeon: Genell Ken, MD;  Location: [facility] ENDOSCOPY;  Service: Gastroenterology;  Laterality: N/A;   CARDIOVERSION N/A 02/29/2024   Procedure:  CARDIOVERSION;  Surgeon: Harrold Lincoln, MD;  Location: [facility] INVASIVE CV LAB;  Service: Cardiovascular;  Laterality: N/A;   child birth     x3   CHOLECYSTECTOMY     ESOPHAGEAL MANOMETRY N/A 07/22/2023   Procedure: ESOPHAGEAL MANOMETRY (EM);  Surgeon: Baldo Bonds, MD;  Location: [facility] ENDOS[facility];  Service: Gastroenterology;  Laterality: N/A;   ESOPHAGOGASTRODUODENOSCOPY (EGD) WITH PROPOFOL  N/A 03/24/2023   Procedure: ESOPHAGOGASTRODUODENOSCOPY (EGD) WITH PROPOFOL ;  Surgeon: Genell Ken, MD;  Location: [facility]OS[facility];  Service: Gastroenterology;  Laterality: N/A;    MEDICATIONS:  albuterol  (VENTOLIN  HFA) 108 (90 Base) MCG/ACT inhaler   ALPRAZolam  (XANAX ) 0.5 MG tablet   Ascorbic Acid (VITAMIN C PO)   atenolol  (TENORMIN ) 50 MG tablet   B Complex-C (SUPER B COMPLEX PO)   Biotin 16109 MCG TBDP   CALCIUM -VITAMIN D PO   ELIQUIS  5 MG TABS tablet   furosemide  (LASIX ) 20 MG tablet   hydrochlorothiazide  (MICROZIDE ) 12.5 MG capsule   HYDROcodone -acetaminophen  (NORCO/VICODIN) 5-325 MG tablet   hydrOXYzine (ATARAX) 25 MG tablet   irbesartan  (AVAPRO ) 300 MG tablet   levothyroxine  (SYNTHROID ) 25 MCG tablet   lidocaine -prilocaine  (EMLA ) cream   METAMUCIL FIBER PO   Misc Natural Products (GLUCOSAMINE CHOND MSM FORMULA PO)   Multiple Vitamin (MULTIVITAMIN) tablet   omeprazole  (PRILOSEC  OTC) 20 MG tablet   Polyethyl Glycol-Propyl Glycol (SYSTANE) 0.4-0.3 % SOLN   promethazine (PHENERGAN) 25 MG tablet   rosuvastatin  (CRESTOR ) 5 MG tablet   Selenium 100 MCG TABS   valACYclovir (VALTREX) 1000 MG tablet   No current facility-administered medications for this encounter.    Chick Cotton Ward, PA-C [facility] Pre-Surgical Testing [phone]

## 2024-04-20 ENCOUNTER — Ambulatory Visit (HOSPITAL_BASED_OUTPATIENT_CLINIC_OR_DEPARTMENT_OTHER): Payer: Self-pay | Admitting: Physician Assistant

## 2024-04-20 ENCOUNTER — Other Ambulatory Visit: Payer: Self-pay

## 2024-04-20 ENCOUNTER — Encounter (HOSPITAL_COMMUNITY)
Admission: RE | Disposition: A | Discharge status: Home / Self Care | Payer: Self-pay | Source: Ambulatory Visit | Attending: Orthopedic Surgery

## 2024-04-20 ENCOUNTER — Ambulatory Visit (HOSPITAL_COMMUNITY): Payer: Self-pay | Admitting: Physician Assistant

## 2024-04-20 ENCOUNTER — Encounter (HOSPITAL_COMMUNITY): Payer: Self-pay | Admitting: Orthopedic Surgery

## 2024-04-20 ENCOUNTER — Ambulatory Visit (HOSPITAL_COMMUNITY)

## 2024-04-20 ENCOUNTER — Observation Stay (HOSPITAL_COMMUNITY)
Admission: RE | Admit: 2024-04-20 | Discharge: 2024-04-22 | Disposition: A | Discharge status: Home / Self Care | Payer: PPO | Source: Ambulatory Visit | Attending: Orthopedic Surgery | Admitting: Orthopedic Surgery

## 2024-04-20 ENCOUNTER — Other Ambulatory Visit (HOSPITAL_COMMUNITY): Payer: Self-pay

## 2024-04-20 ENCOUNTER — Observation Stay (HOSPITAL_COMMUNITY)

## 2024-04-20 DIAGNOSIS — Z79899 Other long term (current) drug therapy: Secondary | ICD-10-CM | POA: Diagnosis not present

## 2024-04-20 DIAGNOSIS — Z7901 Long term (current) use of anticoagulants: Secondary | ICD-10-CM | POA: Diagnosis not present

## 2024-04-20 DIAGNOSIS — Z471 Aftercare following joint replacement surgery: Secondary | ICD-10-CM | POA: Diagnosis not present

## 2024-04-20 DIAGNOSIS — M1612 Unilateral primary osteoarthritis, left hip: Principal | ICD-10-CM | POA: Diagnosis present

## 2024-04-20 DIAGNOSIS — I10 Essential (primary) hypertension: Principal | ICD-10-CM | POA: Insufficient documentation

## 2024-04-20 DIAGNOSIS — E785 Hyperlipidemia, unspecified: Secondary | ICD-10-CM

## 2024-04-20 DIAGNOSIS — I2510 Atherosclerotic heart disease of native coronary artery without angina pectoris: Secondary | ICD-10-CM

## 2024-04-20 DIAGNOSIS — M169 Osteoarthritis of hip, unspecified: Secondary | ICD-10-CM | POA: Diagnosis present

## 2024-04-20 DIAGNOSIS — Z96642 Presence of left artificial hip joint: Secondary | ICD-10-CM | POA: Diagnosis not present

## 2024-04-20 DIAGNOSIS — I4819 Other persistent atrial fibrillation: Secondary | ICD-10-CM | POA: Diagnosis not present

## 2024-04-20 DIAGNOSIS — Z01818 Encounter for other preprocedural examination: Secondary | ICD-10-CM

## 2024-04-20 DIAGNOSIS — M1611 Unilateral primary osteoarthritis, right hip: Secondary | ICD-10-CM | POA: Diagnosis not present

## 2024-04-20 HISTORY — PX: TOTAL HIP ARTHROPLASTY: SHX124

## 2024-04-20 LAB — ABO/RH: ABO/RH(D): O POS

## 2024-04-20 LAB — BASIC METABOLIC PANEL WITH GFR
Anion gap: 10 (ref 5–15)
BUN: 11 mg/dL (ref 8–23)
CO2: 26 mmol/L (ref 22–32)
Calcium: 9.1 mg/dL (ref 8.9–10.3)
Chloride: 96 mmol/L — ABNORMAL LOW (ref 98–111)
Creatinine, Ser: 0.64 mg/dL (ref 0.44–1.00)
GFR, Estimated: >60 mL/min (ref >60)
Glucose, Bld: 105 mg/dL — ABNORMAL HIGH (ref 70–99)
Potassium: 3.4 mmol/L — ABNORMAL LOW (ref 3.5–5.1)
Sodium: 132 mmol/L — ABNORMAL LOW (ref 135–145)

## 2024-04-20 LAB — TYPE AND SCREEN
ABO/RH(D): O POS
Antibody Screen: NEGATIVE

## 2024-04-20 SURGERY — ARTHROPLASTY, HIP, TOTAL, ANTERIOR APPROACH
Anesthesia: General | Site: Hip | Laterality: Left

## 2024-04-20 MED ORDER — HYDROCHLOROTHIAZIDE 12.5 MG PO TABS
12.5000 mg | ORAL_TABLET | Freq: Every day | ORAL | Status: DC
  Administered 2024-04-21 – 2024-04-22 (×2): 12.5 mg via ORAL
  Filled 2024-04-20 (×3): qty 1

## 2024-04-20 MED ORDER — APIXABAN 2.5 MG PO TABS
2.5000 mg | ORAL_TABLET | Freq: Two times a day (BID) | ORAL | Status: DC
  Administered 2024-04-21 – 2024-04-22 (×3): 2.5 mg via ORAL
  Filled 2024-04-20 (×3): qty 1

## 2024-04-20 MED ORDER — WATER FOR IRRIGATION, STERILE IR SOLN
Status: DC | PRN
  Administered 2024-04-20: 1000 mL

## 2024-04-20 MED ORDER — CEFAZOLIN SODIUM-DEXTROSE 2-4 GM/100ML-% IV SOLN
2.0000 g | Freq: Four times a day (QID) | INTRAVENOUS | Status: AC
  Administered 2024-04-20 (×2): 2 g via INTRAVENOUS
  Filled 2024-04-20 (×2): qty 100

## 2024-04-20 MED ORDER — DEXAMETHASONE SODIUM PHOSPHATE 10 MG/ML IJ SOLN
8.0000 mg | Freq: Once | INTRAMUSCULAR | Status: AC
  Administered 2024-04-20: 8 mg via INTRAVENOUS

## 2024-04-20 MED ORDER — METHOCARBAMOL 1000 MG/10ML IJ SOLN
500.0000 mg | Freq: Four times a day (QID) | INTRAMUSCULAR | Status: DC | PRN
  Administered 2024-04-20: 500 mg via INTRAVENOUS

## 2024-04-20 MED ORDER — MORPHINE SULFATE (PF) 2 MG/ML IV SOLN: 0.5000 mg | INTRAVENOUS | Status: DC | PRN

## 2024-04-20 MED ORDER — PANTOPRAZOLE SODIUM 40 MG PO TBEC
40.0000 mg | DELAYED_RELEASE_TABLET | Freq: Every day | ORAL | Status: DC
  Administered 2024-04-21 – 2024-04-22 (×2): 40 mg via ORAL
  Filled 2024-04-20 (×2): qty 1

## 2024-04-20 MED ORDER — ATENOLOL 50 MG PO TABS
50.0000 mg | ORAL_TABLET | Freq: Two times a day (BID) | ORAL | Status: DC
  Administered 2024-04-20 – 2024-04-22 (×4): 50 mg via ORAL
  Filled 2024-04-20 (×4): qty 1

## 2024-04-20 MED ORDER — TRANEXAMIC ACID-NACL 1000-0.7 MG/100ML-% IV SOLN
1000.0000 mg | INTRAVENOUS | Status: AC
  Administered 2024-04-20: 1000 mg via INTRAVENOUS
  Filled 2024-04-20: qty 100

## 2024-04-20 MED ORDER — TRAMADOL HCL 50 MG PO TABS
50.0000 mg | ORAL_TABLET | Freq: Four times a day (QID) | ORAL | Status: DC | PRN
  Administered 2024-04-20 – 2024-04-21 (×2): 100 mg via ORAL
  Filled 2024-04-20 (×2): qty 2

## 2024-04-20 MED ORDER — PROMETHAZINE HCL 25 MG PO TABS
25.0000 mg | ORAL_TABLET | Freq: Four times a day (QID) | ORAL | Status: DC | PRN
  Administered 2024-04-20 – 2024-04-21 (×2): 25 mg via ORAL
  Filled 2024-04-20 (×2): qty 1

## 2024-04-20 MED ORDER — SODIUM CHLORIDE 0.9 % IV SOLN: INTRAVENOUS | Status: DC

## 2024-04-20 MED ORDER — FENTANYL CITRATE PF 50 MCG/ML IJ SOSY
PREFILLED_SYRINGE | INTRAMUSCULAR | Status: AC
Start: 2024-04-20 — End: ?
  Filled 2024-04-20: qty 1

## 2024-04-20 MED ORDER — AMISULPRIDE (ANTIEMETIC) 5 MG/2ML IV SOLN
INTRAVENOUS | Status: AC
  Filled 2024-04-20: qty 4

## 2024-04-20 MED ORDER — FENTANYL CITRATE (PF) 100 MCG/2ML IJ SOLN
INTRAMUSCULAR | Status: AC
  Filled 2024-04-20: qty 2

## 2024-04-20 MED ORDER — BUPIVACAINE-EPINEPHRINE (PF) 0.25% -1:200000 IJ SOLN
INTRAMUSCULAR | Status: AC
Start: 2024-04-20 — End: ?
  Filled 2024-04-20: qty 30

## 2024-04-20 MED ORDER — ROCURONIUM BROMIDE 10 MG/ML (PF) SYRINGE
PREFILLED_SYRINGE | INTRAVENOUS | Status: DC | PRN
  Administered 2024-04-20: 50 mg via INTRAVENOUS

## 2024-04-20 MED ORDER — ORAL CARE MOUTH RINSE: 15.0000 mL | Freq: Once | OROMUCOSAL | Status: AC

## 2024-04-20 MED ORDER — MENTHOL 3 MG MT LOZG: 1.0000 | LOZENGE | OROMUCOSAL | Status: DC | PRN

## 2024-04-20 MED ORDER — MUPIROCIN 2 % EX OINT
1.0000 | TOPICAL_OINTMENT | Freq: Two times a day (BID) | CUTANEOUS | 0 refills | Status: AC
  Filled 2024-04-20: qty 66, 30d supply, fill #0

## 2024-04-20 MED ORDER — DEXAMETHASONE SODIUM PHOSPHATE 10 MG/ML IJ SOLN
10.0000 mg | Freq: Once | INTRAMUSCULAR | Status: AC
  Administered 2024-04-21: 10 mg via INTRAVENOUS
  Filled 2024-04-20: qty 1

## 2024-04-20 MED ORDER — CHLORHEXIDINE GLUCONATE 0.12 % MT SOLN
15.0000 mL | Freq: Once | OROMUCOSAL | Status: AC
  Administered 2024-04-20: 15 mL via OROMUCOSAL

## 2024-04-20 MED ORDER — CHLORHEXIDINE GLUCONATE 4 % EX SOLN
1.0000 | CUTANEOUS | 1 refills | Status: DC
  Filled 2024-04-20: qty 946, 15d supply, fill #0

## 2024-04-20 MED ORDER — METOCLOPRAMIDE HCL 5 MG/ML IJ SOLN: 5.0000 mg | Freq: Three times a day (TID) | INTRAMUSCULAR | Status: DC | PRN

## 2024-04-20 MED ORDER — HYDROCODONE-ACETAMINOPHEN 5-325 MG PO TABS
1.0000 | ORAL_TABLET | ORAL | Status: DC | PRN
  Administered 2024-04-20 – 2024-04-22 (×4): 2 via ORAL
  Filled 2024-04-20 (×4): qty 2

## 2024-04-20 MED ORDER — METHOCARBAMOL 1000 MG/10ML IJ SOLN
INTRAMUSCULAR | Status: AC
  Filled 2024-04-20: qty 10

## 2024-04-20 MED ORDER — BUPIVACAINE-EPINEPHRINE (PF) 0.25% -1:200000 IJ SOLN
INTRAMUSCULAR | Status: DC | PRN
  Administered 2024-04-20: 30 mL

## 2024-04-20 MED ORDER — ACETAMINOPHEN 10 MG/ML IV SOLN
1000.0000 mg | Freq: Four times a day (QID) | INTRAVENOUS | Status: DC
  Administered 2024-04-20: 1000 mg via INTRAVENOUS
  Filled 2024-04-20: qty 100

## 2024-04-20 MED ORDER — LACTATED RINGERS IV SOLN: INTRAVENOUS | Status: DC

## 2024-04-20 MED ORDER — ACETAMINOPHEN 325 MG PO TABS
325.0000 mg | ORAL_TABLET | Freq: Four times a day (QID) | ORAL | Status: DC | PRN
  Administered 2024-04-22: 650 mg via ORAL
  Filled 2024-04-20: qty 2

## 2024-04-20 MED ORDER — ONDANSETRON HCL 4 MG/2ML IJ SOLN
INTRAMUSCULAR | Status: DC | PRN
  Administered 2024-04-20: 4 mg via INTRAVENOUS

## 2024-04-20 MED ORDER — HYDROXYZINE HCL 25 MG PO TABS: 25.0000 mg | ORAL_TABLET | Freq: Every day | ORAL | Status: DC | PRN

## 2024-04-20 MED ORDER — PROPOFOL 500 MG/50ML IV EMUL
INTRAVENOUS | Status: DC | PRN
  Administered 2024-04-20: 100 ug/kg/min via INTRAVENOUS

## 2024-04-20 MED ORDER — POLYETHYLENE GLYCOL 3350 17 G PO PACK: 17.0000 g | PACK | Freq: Every day | ORAL | Status: DC | PRN

## 2024-04-20 MED ORDER — AMISULPRIDE (ANTIEMETIC) 5 MG/2ML IV SOLN
10.0000 mg | Freq: Once | INTRAVENOUS | Status: AC
  Administered 2024-04-20: 10 mg via INTRAVENOUS

## 2024-04-20 MED ORDER — LEVOTHYROXINE SODIUM 25 MCG PO TABS
25.0000 ug | ORAL_TABLET | Freq: Every day | ORAL | Status: DC
  Administered 2024-04-21 – 2024-04-22 (×2): 25 ug via ORAL
  Filled 2024-04-20 (×2): qty 1

## 2024-04-20 MED ORDER — POVIDONE-IODINE 10 % EX SWAB: 2.0000 | Freq: Once | CUTANEOUS | Status: DC

## 2024-04-20 MED ORDER — ROSUVASTATIN CALCIUM 5 MG PO TABS
5.0000 mg | ORAL_TABLET | Freq: Every day | ORAL | Status: DC
  Administered 2024-04-21: 5 mg via ORAL
  Filled 2024-04-20: qty 1

## 2024-04-20 MED ORDER — CEFAZOLIN SODIUM-DEXTROSE 2-4 GM/100ML-% IV SOLN
2.0000 g | INTRAVENOUS | Status: AC
  Administered 2024-04-20: 2 g via INTRAVENOUS
  Filled 2024-04-20: qty 100

## 2024-04-20 MED ORDER — IRBESARTAN 300 MG PO TABS
300.0000 mg | ORAL_TABLET | Freq: Every morning | ORAL | Status: DC
  Administered 2024-04-22: 300 mg via ORAL
  Filled 2024-04-20: qty 1
  Filled 2024-04-20: qty 2
  Filled 2024-04-20: qty 1

## 2024-04-20 MED ORDER — SUGAMMADEX SODIUM 200 MG/2ML IV SOLN
INTRAVENOUS | Status: DC | PRN
Start: 2024-04-20 — End: 2024-04-20
  Administered 2024-04-20: 200 mg via INTRAVENOUS

## 2024-04-20 MED ORDER — DOCUSATE SODIUM 100 MG PO CAPS
100.0000 mg | ORAL_CAPSULE | Freq: Two times a day (BID) | ORAL | Status: DC
  Administered 2024-04-20 – 2024-04-22 (×4): 100 mg via ORAL
  Filled 2024-04-20 (×4): qty 1

## 2024-04-20 MED ORDER — ROCURONIUM BROMIDE 10 MG/ML (PF) SYRINGE
PREFILLED_SYRINGE | INTRAVENOUS | Status: AC
  Filled 2024-04-20: qty 10

## 2024-04-20 MED ORDER — MAGNESIUM CITRATE PO SOLN: 1.0000 | Freq: Once | ORAL | Status: DC | PRN

## 2024-04-20 MED ORDER — PROPOFOL 10 MG/ML IV BOLUS
INTRAVENOUS | Status: DC | PRN
  Administered 2024-04-20: 90 mg via INTRAVENOUS
  Administered 2024-04-20: 20 mg via INTRAVENOUS

## 2024-04-20 MED ORDER — PHENOL 1.4 % MT LIQD: 1.0000 | OROMUCOSAL | Status: DC | PRN

## 2024-04-20 MED ORDER — LIDOCAINE 2% (20 MG/ML) 5 ML SYRINGE: INTRAMUSCULAR | Status: DC | PRN

## 2024-04-20 MED ORDER — ALBUTEROL SULFATE (2.5 MG/3ML) 0.083% IN NEBU: 2.5000 mg | INHALATION_SOLUTION | RESPIRATORY_TRACT | Status: DC | PRN

## 2024-04-20 MED ORDER — PROPOFOL 500 MG/50ML IV EMUL
INTRAVENOUS | Status: AC
  Filled 2024-04-20: qty 50

## 2024-04-20 MED ORDER — PROPOFOL 1000 MG/100ML IV EMUL
INTRAVENOUS | Status: AC
  Filled 2024-04-20: qty 100

## 2024-04-20 MED ORDER — ALPRAZOLAM 0.5 MG PO TABS
0.5000 mg | ORAL_TABLET | Freq: Every day | ORAL | Status: DC | PRN
Start: 2024-04-20 — End: 2024-04-22
  Administered 2024-04-20 – 2024-04-21 (×2): 0.5 mg via ORAL
  Filled 2024-04-20 (×2): qty 1

## 2024-04-20 MED ORDER — FENTANYL CITRATE (PF) 100 MCG/2ML IJ SOLN
INTRAMUSCULAR | Status: DC | PRN
  Administered 2024-04-20: 50 ug via INTRAVENOUS
  Administered 2024-04-20: 75 ug via INTRAVENOUS
  Administered 2024-04-20: 25 ug via INTRAVENOUS
  Administered 2024-04-20 (×3): 50 ug via INTRAVENOUS

## 2024-04-20 MED ORDER — METHOCARBAMOL 500 MG PO TABS
500.0000 mg | ORAL_TABLET | Freq: Four times a day (QID) | ORAL | Status: DC | PRN
  Administered 2024-04-21: 500 mg via ORAL
  Filled 2024-04-20: qty 1

## 2024-04-20 MED ORDER — ONDANSETRON HCL 4 MG/2ML IJ SOLN
INTRAMUSCULAR | Status: AC
Start: 2024-04-20 — End: ?
  Filled 2024-04-20: qty 2

## 2024-04-20 MED ORDER — FENTANYL CITRATE PF 50 MCG/ML IJ SOSY
25.0000 ug | PREFILLED_SYRINGE | INTRAMUSCULAR | Status: DC | PRN
  Administered 2024-04-20 (×2): 25 ug via INTRAVENOUS

## 2024-04-20 MED ORDER — BISACODYL 10 MG RE SUPP: 10.0000 mg | Freq: Every day | RECTAL | Status: DC | PRN

## 2024-04-20 MED ORDER — 0.9 % SODIUM CHLORIDE (POUR BTL) OPTIME
TOPICAL | Status: DC | PRN
  Administered 2024-04-20: 1000 mL

## 2024-04-20 MED ORDER — LIDOCAINE HCL (PF) 2 % IJ SOLN
INTRAMUSCULAR | Status: AC
  Filled 2024-04-20: qty 5

## 2024-04-20 MED ORDER — DEXAMETHASONE SODIUM PHOSPHATE 10 MG/ML IJ SOLN
INTRAMUSCULAR | Status: AC
  Filled 2024-04-20: qty 1

## 2024-04-20 MED ORDER — METOCLOPRAMIDE HCL 5 MG PO TABS
5.0000 mg | ORAL_TABLET | Freq: Three times a day (TID) | ORAL | Status: DC | PRN
  Administered 2024-04-20: 10 mg via ORAL
  Filled 2024-04-20: qty 2

## 2024-04-20 MED ORDER — LIDOCAINE HCL (PF) 2 % IJ SOLN
INTRAMUSCULAR | Status: DC | PRN
  Administered 2024-04-20: 50 mg via INTRADERMAL

## 2024-04-20 MED ORDER — PHENYLEPHRINE HCL-NACL 20-0.9 MG/250ML-% IV SOLN
INTRAVENOUS | Status: DC | PRN
  Administered 2024-04-20: 35 ug/min via INTRAVENOUS

## 2024-04-20 SURGICAL SUPPLY — 35 items
BAG COUNTER SPONGE SURGICOUNT (BAG) IMPLANT
BAG ZIPLOCK 12X15 (MISCELLANEOUS) IMPLANT
BLADE SAG 18X100X1.27 (BLADE) ×2 IMPLANT
COVER PERINEAL POST (MISCELLANEOUS) ×2 IMPLANT
COVER SURGICAL LIGHT HANDLE (MISCELLANEOUS) ×2 IMPLANT
CUP ACET PINNACLE SECTR 50MM (Hips) IMPLANT
DERMABOND ADVANCED .7 DNX12 (GAUZE/BANDAGES/DRESSINGS) ×2 IMPLANT
DRAPE FOOT SWITCH (DRAPES) ×2 IMPLANT
DRAPE STERI IOBAN 125X83 (DRAPES) ×2 IMPLANT
DRAPE U-SHAPE 47X51 STRL (DRAPES) ×4 IMPLANT
DRSG AQUACEL AG ADV 3.5X10 (GAUZE/BANDAGES/DRESSINGS) ×2 IMPLANT
DURAPREP 26ML APPLICATOR (WOUND CARE) ×2 IMPLANT
ELECT REM PT RETURN 15FT ADLT (MISCELLANEOUS) ×2 IMPLANT
GLOVE BIO SURGEON STRL SZ 6.5 (GLOVE) IMPLANT
GLOVE BIO SURGEON STRL SZ7 (GLOVE) IMPLANT
GLOVE BIO SURGEON STRL SZ8 (GLOVE) ×2 IMPLANT
GLOVE BIOGEL PI IND STRL 7.0 (GLOVE) IMPLANT
GLOVE BIOGEL PI IND STRL 8 (GLOVE) ×2 IMPLANT
GOWN STRL REUS W/ TWL LRG LVL3 (GOWN DISPOSABLE) ×4 IMPLANT
HEAD FEM STD 32X+5 STRL (Hips) IMPLANT
HOLDER FOLEY CATH W/STRAP (MISCELLANEOUS) ×2 IMPLANT
KIT TURNOVER KIT A (KITS) IMPLANT
LINER MARATHON 32 50 (Hips) IMPLANT
MANIFOLD NEPTUNE II (INSTRUMENTS) ×2 IMPLANT
PACK ANTERIOR HIP CUSTOM (KITS) ×2 IMPLANT
PENCIL SMOKE EVACUATOR COATED (MISCELLANEOUS) ×2 IMPLANT
SPIKE FLUID TRANSFER (MISCELLANEOUS) ×2 IMPLANT
STEM FEM ACTIS STD SZ4 (Stem) IMPLANT
SUT ETHIBOND NAB CT1 #1 30IN (SUTURE) ×2 IMPLANT
SUT MNCRL AB 4-0 PS2 18 (SUTURE) ×2 IMPLANT
SUT VIC AB 2-0 CT1 TAPERPNT 27 (SUTURE) ×4 IMPLANT
SUTURE STRATFX 0 PDS 27 VIOLET (SUTURE) ×2 IMPLANT
TOWEL GREEN STERILE FF (TOWEL DISPOSABLE) ×2 IMPLANT
TRAY FOLEY MTR SLVR 16FR STAT (SET/KITS/TRAYS/PACK) ×2 IMPLANT
TUBE SUCTION HIGH CAP CLEAR NV (SUCTIONS) ×2 IMPLANT

## 2024-04-20 NOTE — Plan of Care (Signed)

## 2024-04-20 NOTE — Anesthesia Preprocedure Evaluation (Addendum)
 Anesthesia Evaluation  Patient identified by MRN, date of birth, ID band Patient awake    Reviewed: Allergy & Precautions, NPO status , Patient's Chart, lab work & pertinent test results  History of Anesthesia Complications (+) PONV and history of anesthetic complications  Airway Mallampati: III  TM Distance: >3 FB Neck ROM: Full    Dental  (+) Teeth Intact, Dental Advisory Given   Pulmonary sleep apnea and Continuous Positive Airway Pressure Ventilation , neg COPD   breath sounds clear to auscultation       Cardiovascular hypertension, Pt. on medications and Pt. on home beta blockers + CAD  (-) Past MI and (-) Cardiac Stents + dysrhythmias Atrial Fibrillation  Rhythm:Regular  1. Left ventricular ejection fraction, by estimation, is 55 to 60%. The  left ventricle has normal function. The left ventricle has no regional  wall motion abnormalities. Left ventricular diastolic parameters are  consistent with Grade II diastolic  dysfunction (pseudonormalization).   2. Right ventricular systolic function is normal. The right ventricular  size is mildly enlarged.   3. The mitral valve is normal in structure. No evidence of mitral valve  regurgitation.   4. The aortic valve is normal in structure. Aortic valve regurgitation is  not visualized.   5. There is mild dilatation of the ascending aorta, measuring 37 mm.   6. The inferior vena cava is normal in size with greater than 50%  respiratory variability, suggesting right atrial pressure of 3 mmHg.     The study is normal. The study is low risk.   No ST deviation was noted.   Left ventricular function is normal. Nuclear stress EF: 58 %. The left ventricular ejection fraction is normal (55-65%). End diastolic cavity size is normal.   Prior study available for comparison from 02/16/2021.     Neuro/Psych neg Seizures  Anxiety      Neuromuscular disease    GI/Hepatic Neg liver ROS,  hiatal hernia,,,  Endo/Other  Lab Results      Component                Value               Date                      HGBA1C                   6.4 (H)             02/16/2021             Renal/GU negative Renal ROS     Musculoskeletal  (+) Arthritis ,    Abdominal   Peds  Hematology  (+) Blood dyscrasia, anemia Lab Results      Component                Value               Date                      WBC                      7.5                 04/13/2024                HGB  11.8 (L)            04/13/2024                HCT                      35.7 (L)            04/13/2024                MCV                      94.2                04/13/2024                PLT                      317                 04/13/2024              Anesthesia Other Findings   Reproductive/Obstetrics                             Anesthesia Physical Anesthesia Plan  ASA: 3  Anesthesia Plan: General   Post-op Pain Management: Ofirmev  IV (intra-op)*   Induction: Intravenous  PONV Risk Score and Plan: 4 or greater and Ondansetron , Dexamethasone , Propofol  infusion and TIVA  Airway Management Planned: Oral ETT  Additional Equipment: None  Intra-op Plan:   Post-operative Plan: Extubation in OR  Informed Consent: I have reviewed the patients History and Physical, chart, labs and discussed the procedure including the risks, benefits and alternatives for the proposed anesthesia with the patient or authorized representative who has indicated his/her understanding and acceptance.     Dental advisory given  Plan Discussed with: CRNA  Anesthesia Plan Comments:        Anesthesia Quick Evaluation

## 2024-04-20 NOTE — Care Plan (Signed)
 Ortho Bundle Case Management Note  Patient Details  Name: CODY OLIGER MRN: [id number] Date of Birth: [birth year]                  L THA on 04/20/24.  DCP: Home with son for about 2 weeks and then daughter for 2 weeks.  DME: RW ordered through [employer]. Has canes and hospital bed.  PT: [facility]P   DME Arranged:  [facility] rolling DME Agency:  [employer]    Additional Comments: Please contact me with any questions of if this plan should need to change.    Bronwen Canon, CCM Case Manager, [place]  [phone] 04/20/2024, 1:06 PM

## 2024-04-20 NOTE — Transfer of Care (Signed)
 Immediate Anesthesia Transfer of Care Note  Patient: Tabitha Murphy  Procedure(s) Performed: ARTHROPLASTY, HIP, TOTAL, ANTERIOR APPROACH (Left: Hip)  Patient Location: PACU  Anesthesia Type:General  Level of Consciousness: awake, alert , and oriented  Airway & Oxygen Therapy: Patient Spontanous Breathing and Patient connected to face mask oxygen  Post-op Assessment: Report given to RN and Post -op Vital signs reviewed and stable  Post vital signs: Reviewed and stable  Last Vitals:  Vitals Value Taken Time  BP 167/97 04/20/24 1340  Temp    Pulse 69 04/20/24 1342  Resp 13 04/20/24 1342  SpO2 99 % 04/20/24 1342  Vitals shown include unfiled device data.  Last Pain:  Vitals:   04/20/24 1011  TempSrc: Oral         Complications: No notable events documented.

## 2024-04-20 NOTE — Interval H&P Note (Signed)
 History and Physical Interval Note:  04/20/2024 10:55 AM  Tabitha Murphy  has presented today for surgery, with the diagnosis of Left Hip Osteoarthritis.  The various methods of treatment have been discussed with the patient and family. After consideration of risks, benefits and other options for treatment, the patient has consented to  Procedure(s): ARTHROPLASTY, HIP, TOTAL, ANTERIOR APPROACH (Left) as a surgical intervention.  The patient's history has been reviewed, patient examined, no change in status, stable for surgery.  I have reviewed the patient's chart and labs.  Questions were answered to the patient's satisfaction.     Samuel Crock Jeremy Ditullio

## 2024-04-20 NOTE — Progress Notes (Signed)
 PT Cancellation Note  Patient Details Name: Tabitha Murphy MRN: [id number] DOB: 1941/09/07   Cancelled Treatment:    Reason Eval/Treat Not Completed: Pain limiting ability to participate. Pt reports she is unable to participate with therapy due to LBP and L LE pain 10/10. Nursing provided pain medication prior to PT arrival. PT to continue to follow acutely.   Cary Clarks, PT Acute Rehab   Annalee Kiang 04/20/2024, 5:31 PM

## 2024-04-20 NOTE — Anesthesia Postprocedure Evaluation (Signed)
 Anesthesia Post Note  Patient: Tabitha Murphy  Procedure(s) Performed: ARTHROPLASTY, HIP, TOTAL, ANTERIOR APPROACH (Left: Hip)     Patient location during evaluation: Nursing Unit Anesthesia Type: General Level of consciousness: patient cooperative and awake Pain management: pain level controlled Vital Signs Assessment: post-procedure vital signs reviewed and stable Respiratory status: spontaneous breathing, nonlabored ventilation and respiratory function stable Cardiovascular status: blood pressure returned to baseline and stable Postop Assessment: no apparent nausea or vomiting Anesthetic complications: no   No notable events documented.  Last Vitals:  Vitals:   04/20/24 1604 04/20/24 1807  BP: (!) 155/83 (!) 163/66  Pulse: 86 78  Resp: 14 16  Temp: (!) 36.1 C (!) 36.4 C  SpO2: 97% 100%    Last Pain:  Vitals:   04/20/24 1807  TempSrc: Axillary  PainSc:                  Collyns Mcquigg

## 2024-04-20 NOTE — Op Note (Signed)
 OPERATIVE REPORT- TOTAL HIP ARTHROPLASTY   PREOPERATIVE DIAGNOSIS: Osteoarthritis of the Left hip.   POSTOPERATIVE DIAGNOSIS: Osteoarthritis of the Left  hip.   PROCEDURE: Left total hip arthroplasty, anterior approach.   SURGEON: Liliane Rei, MD   ASSISTANT: Sharlynn Dear, PA-C  ANESTHESIA:  General  ESTIMATED BLOOD LOSS:-500 mL    DRAINS: None  COMPLICATIONS: None   CONDITION: PACU - hemodynamically stable.   BRIEF CLINICAL NOTE: Tabitha Murphy is a 83 y.o. female who has advanced end-  stage arthritis of their Left  hip with progressively worsening pain and  dysfunction.The patient has failed nonoperative management and presents for  total hip arthroplasty.   PROCEDURE IN DETAIL: After successful administration of spinal  anesthetic, the traction boots for the [facility] bed were placed on both  feet and the patient was placed onto the [facility] bed, boots placed into the leg  holders. The Left hip was then isolated from the perineum with plastic  drapes and prepped and draped in the usual sterile fashion. ASIS and  greater trochanter were marked and a oblique incision was made, starting  at about 1 cm lateral and 2 cm distal to the ASIS and coursing towards  the anterior cortex of the femur. The skin was cut with a 10 blade  through subcutaneous tissue to the level of the fascia overlying the  tensor fascia lata muscle. The fascia was then incised in line with the  incision at the junction of the anterior third and posterior 2/3rd. The  muscle was teased off the fascia and then the interval between the TFL  and the rectus was developed. The Hohmann retractor was then placed at  the top of the femoral neck over the capsule. The vessels overlying the  capsule were cauterized and the fat on top of the capsule was removed.  A Hohmann retractor was then placed anterior underneath the rectus  femoris to give exposure to the entire anterior capsule. A T-shaped  capsulotomy  was performed. The edges were tagged and the femoral head  was identified.       Osteophytes are removed off the superior acetabulum.  The femoral neck was then cut in situ with an oscillating saw. Traction  was then applied to the left lower extremity utilizing the [facility]  traction. The femoral head was then removed. Retractors were placed  around the acetabulum and then circumferential removal of the labrum was  performed. Osteophytes were also removed. Reaming starts at 45 mm to  medialize and  Increased in 2 mm increments to 47 mm. We reamed in  approximately 40 degrees of abduction, 20 degrees anteversion. A 48 mm  pinnacle acetabular shell was then impacted in anatomic position under  fluoroscopic guidance with excellent purchase. We did not need to place  any additional dome screws. A 28 mm neutral + 4 marathon liner was then  placed into the acetabular shell.       The femoral lift was then placed along the lateral aspect of the femur  just distal to the vastus ridge. The leg was  externally rotated and capsule  was stripped off the inferior aspect of the femoral neck down to the  level of the lesser trochanter, this was done with electrocautery. The femur was lifted after this was performed. The  leg was then placed in an extended and adducted position essentially delivering the femur. We also removed the capsule superiorly and the piriformis from the piriformis fossa to  gain excellent exposure of the  proximal femur. Rongeur was used to remove some cancellous bone to get  into the lateral portion of the proximal femur for placement of the  initial starter reamer. The starter broaches was placed  the starter broach  and was shown to go down the center of the canal. Broaching  with the Actis system was then performed starting at size 0  coursing  Up to size 4. A size 4 had excellent torsional and rotational  and axial stability. The trial standard offset neck was then placed  with a 28  + 5 trial head. The hip was then reduced. We confirmed that  the stem was in the canal both on AP and lateral x-rays. It also has excellent sizing. The hip was reduced with outstanding stability through full extension and full external rotation.. AP pelvis was taken and the leg lengths were measured and found to be equal. Hip was then dislocated again and the femoral head and neck removed. The  femoral broach was removed. Size 4 Actis stem with a standard offset  neck was then impacted into the femur following native anteversion. Has  excellent purchase in the canal. Excellent torsional and rotational and  axial stability. It is confirmed to be in the canal on AP and lateral  fluoroscopic views. The 28 + 5 metal head was placed and the hip  reduced with outstanding stability. Again AP pelvis was taken and it  confirmed that the leg lengths were equal. The wound was then copiously  irrigated with saline solution and the capsule reattached and repaired  with Ethibond suture. 30 ml of .25% Bupivicaine was  injected into the capsule and into the edge of the tensor fascia lata as well as subcutaneous tissue. The fascia overlying the tensor fascia lata was then closed with a running #1 V-Loc. Subcu was closed with interrupted 2-0 Vicryl and subcuticular running 4-0 Monocryl. Incision was cleaned  and dried. Steri-Strips and a bulky sterile dressing applied. The patient was awakened and transported to  recovery in stable condition.        Please note that a surgical assistant was a medical necessity for this procedure to perform it in a safe and expeditious manner. Assistant was necessary to provide appropriate retraction of vital neurovascular structures and to prevent femoral fracture and allow for anatomic placement of the prosthesis.  Liliane Rei, M.D.

## 2024-04-20 NOTE — Discharge Instructions (Signed)
°Tabitha Aluisio, MD °Total Joint Specialist °[facility] °[address] °[place], [place] [address] °[phone] ° °ANTERIOR APPROACH TOTAL HIP REPLACEMENT POSTOPERATIVE DIRECTIONS ° ° ° ° °Hip Rehabilitation, Guidelines Following Surgery  °The results of a hip operation are greatly improved after range of motion and muscle strengthening exercises. Follow all safety measures which are given to protect your hip. If any of these exercises cause increased pain or swelling in your joint, decrease the amount until you are comfortable again. Then slowly increase the exercises. Call your caregiver if you have problems or questions.  ° °HOME CARE INSTRUCTIONS  °Remove items at home which could result in a fall. This includes throw rugs or furniture in walking pathways.  °ICE to the affected hip as frequently as 20-30 minutes an hour and then as needed for pain and swelling. Continue to use ice on the hip for pain and swelling from surgery. You may notice swelling that will progress down to the foot and ankle. This is normal after surgery. Elevate the leg when you are not up walking on it.   °Continue to use the breathing machine which will help keep your temperature down.  It is common for your temperature to cycle up and down following surgery, especially at night when you are not up moving around and exerting yourself.  The breathing machine keeps your lungs expanded and your temperature down. ° °DIET °You may resume your previous home diet once your are discharged from the hospital. ° °DRESSING / WOUND CARE / SHOWERING °You have an adhesive waterproof bandage over the incision. Leave this in place until your first follow-up appointment. Once you remove this you will not need to place another bandage.  °You may begin showering 3 days following surgery, but do not submerge the incision under water. ° °ACTIVITY °For the first 3-5 days, it is important to rest and keep the operative leg elevated.  You should, as a general rule, rest for 50 minutes and walk/stretch for 10 minutes per hour. After 5 days, you may slowly increase activity as tolerated.  °Perform the exercises you were provided twice a day for about 15-20 minutes each session. Begin these 2 days following surgery. °Walk with your walker as instructed. Use the walker until you are comfortable transitioning to a cane. Walk with the cane in the opposite hand of the operative leg. You may discontinue the cane once you are comfortable and walking steadily. °Avoid periods of inactivity such as sitting longer than an hour when not asleep. This helps prevent blood clots.  °Do not drive a car for 6 weeks or until released by your surgeon.  °Do not drive while taking narcotics. ° °TED HOSE STOCKINGS °Wear the elastic stockings on both legs for three weeks following surgery during the day. You may remove them at night while sleeping. ° °WEIGHT BEARING °Weight bearing as tolerated with assist device (walker, cane, etc) as directed, use it as long as suggested by your surgeon or therapist, typically at least 4-6 weeks. ° °POSTOPERATIVE CONSTIPATION PROTOCOL °Constipation - defined medically as fewer than three stools per week and severe constipation as less than one stool per week. ° °One of the most common issues patients have following surgery is constipation.  Even if you have a regular bowel pattern at home, your normal regimen is likely to be disrupted due to multiple reasons following surgery.  Combination of anesthesia, postoperative narcotics, change in appetite and fluid intake all can affect your bowels.    In order to avoid complications following surgery, here are some recommendations in order to help you during your recovery period. ° °Colace (docusate) - Pick up an over-the-counter form of Colace or another stool softener and take twice a day as long as you are requiring postoperative pain medications.  Take with a full glass of water daily.  If  you experience loose stools or diarrhea, hold the colace until you stool forms back up.  If your symptoms do not get better within 1 week or if they get worse, check with your doctor. °Dulcolax (bisacodyl) - Pick up over-the-counter and take as directed by the product packaging as needed to assist with the movement of your bowels.  Take with a full glass of water.  Use this product as needed if not relieved by Colace only.  °MiraLax (polyethylene glycol) - Pick up over-the-counter to have on hand.  MiraLax is a solution that will increase the amount of water in your bowels to assist with bowel movements.  Take as directed and can mix with a glass of water, juice, soda, coffee, or tea.  Take if you go more than two days without a movement.Do not use MiraLax more than once per day. Call your doctor if you are still constipated or irregular after using this medication for 7 days in a row. ° °If you continue to have problems with postoperative constipation, please contact the office for further assistance and recommendations.  If you experience "the worst abdominal pain ever" or develop nausea or vomiting, please contact the office immediatly for further recommendations for treatment. ° °ITCHING ° If you experience itching with your medications, try taking only a single pain pill, or even half a pain pill at a time.  You can also use Benadryl over the counter for itching or also to help with sleep.  ° °MEDICATIONS °See your medication summary on the “After Visit Summary” that the nursing staff will review with you prior to discharge.  You may have some home medications which will be placed on hold until you complete the course of blood thinner medication.  It is important for you to complete the blood thinner medication as prescribed by your surgeon.  Continue your approved medications as instructed at time of discharge. ° °PRECAUTIONS °If you experience chest pain or shortness of breath - call 911 immediately for  transfer to the hospital emergency department.  °If you develop a fever greater that 101 F, purulent drainage from wound, increased redness or drainage from wound, foul odor from the wound/dressing, or calf pain - CONTACT YOUR SURGEON.   °                                                °FOLLOW-UP APPOINTMENTS °Make sure you keep all of your appointments after your operation with your surgeon and caregivers. You should call the office at the above phone number and make an appointment for approximately two weeks after the date of your surgery or on the date instructed by your surgeon outlined in the "After Visit Summary". ° °RANGE OF MOTION AND STRENGTHENING EXERCISES  °These exercises are designed to help you keep full movement of your hip joint. Follow your caregiver's or physical therapist's instructions. Perform all exercises about fifteen times, three times per day or as directed. Exercise both hips, even if you have had only   one joint replacement. These exercises can be done on a training (exercise) mat, on the floor, on a table or on a bed. Use whatever works the best and is most comfortable for you. Use music or television while you are exercising so that the exercises are a pleasant break in your day. This will make your life better with the exercises acting as a break in routine you can look forward to.  °Lying on your back, slowly slide your foot toward your buttocks, raising your knee up off the floor. Then slowly slide your foot back down until your leg is straight again.  °Lying on your back spread your legs as far apart as you can without causing discomfort.  °Lying on your side, raise your upper leg and foot straight up from the floor as far as is comfortable. Slowly lower the leg and repeat.  °Lying on your back, tighten up the muscle in the front of your thigh (quadriceps muscles). You can do this by keeping your leg straight and trying to raise your heel off the floor. This helps strengthen the  largest muscle supporting your knee.  °Lying on your back, tighten up the muscles of your buttocks both with the legs straight and with the knee bent at a comfortable angle while keeping your heel on the floor.  ° °POST-OPERATIVE OPIOID TAPER INSTRUCTIONS: °It is important to wean off of your opioid medication as soon as possible. If you do not need pain medication after your surgery it is ok to stop day one. °Opioids include: °Codeine, Hydrocodone(Norco, Vicodin), Oxycodone(Percocet, oxycontin) and hydromorphone amongst others.  °Long term and even short term use of opiods can cause: °Increased pain response °Dependence °Constipation °Depression °Respiratory depression °And more.  °Withdrawal symptoms can include °Flu like symptoms °Nausea, vomiting °And more °Techniques to manage these symptoms °Hydrate well °Eat regular healthy meals °Stay active °Use relaxation techniques(deep breathing, meditating, yoga) °Do Not substitute Alcohol to help with tapering °If you have been on opioids for less than two weeks and do not have pain than it is ok to stop all together.  °Plan to wean off of opioids °This plan should start within one week post op of your joint replacement. °Maintain the same interval or time between taking each dose and first decrease the dose.  °Cut the total daily intake of opioids by one tablet each day °Next start to increase the time between doses. °The last dose that should be eliminated is the evening dose.  ° °IF YOU ARE TRANSFERRED TO A SKILLED REHAB FACILITY °If the patient is transferred to a skilled rehab facility following release from the hospital, a list of the current medications will be sent to the facility for the patient to continue.  When discharged from the skilled rehab facility, please have the facility set up the patient's Home Health Physical Therapy prior to being released. Also, the skilled facility will be responsible for providing the patient with their medications at time of  release from the facility to include their pain medication, the muscle relaxants, and their blood thinner medication. If the patient is still at the rehab facility at time of the two week follow up appointment, the skilled rehab facility will also need to assist the patient in arranging follow up appointment in our office and any transportation needs. ° °MAKE SURE YOU:  °Understand these instructions.  °Get help right away if you are not doing well or get worse.  ° ° °DENTAL ANTIBIOTICS: ° °In most   cases prophylactic antibiotics for Dental procdeures after total joint surgery are not necessary. ° °Exceptions are as follows: ° °1. History of prior total joint infection ° °2. Severely immunocompromised (Organ Transplant, cancer chemotherapy, Rheumatoid biologic °meds such as Humera) ° °3. Poorly controlled diabetes (A1C &gt; 8.0, blood glucose over 200) ° °If you have one of these conditions, contact your surgeon for an antibiotic prescription, prior to your °dental procedure.  ° ° °Pick up stool softner and laxative for home use following surgery while on pain medications. °Do not submerge incision under water. °Please use good hand washing techniques while changing dressing each day. °May shower starting three days after surgery. °Please use a clean towel to pat the incision dry following showers. °Continue to use ice for pain and swelling after surgery. °Do not use any lotions or creams on the incision until instructed by your surgeon. ° °

## 2024-04-20 NOTE — Anesthesia Procedure Notes (Signed)
 Procedure Name: Intubation Date/Time: 04/20/2024 12:03 PM  Performed by: Jenene Miser, CRNAPre-anesthesia Checklist: Patient identified, Emergency Drugs available, Suction available and Patient being monitored Patient Re-evaluated:Patient Re-evaluated prior to induction Oxygen Delivery Method: Circle system utilized Preoxygenation: Pre-oxygenation with 100% oxygen Induction Type: IV induction Ventilation: Mask ventilation without difficulty Laryngoscope Size: Mac and 4 Grade View: Grade I Tube type: Oral Tube size: 7.0 mm Number of attempts: 1 Airway Equipment and Method: Stylet Placement Confirmation: ETT inserted through vocal cords under direct vision, positive ETCO2 and breath sounds checked- equal and bilateral Secured at: 21 cm Tube secured with: Tape Dental Injury: Teeth and Oropharynx as per pre-operative assessment

## 2024-04-21 ENCOUNTER — Other Ambulatory Visit (HOSPITAL_COMMUNITY): Payer: Self-pay

## 2024-04-21 DIAGNOSIS — M1612 Unilateral primary osteoarthritis, left hip: Secondary | ICD-10-CM | POA: Diagnosis not present

## 2024-04-21 LAB — BASIC METABOLIC PANEL WITH GFR
Anion gap: 8 (ref 5–15)
BUN: 11 mg/dL (ref 8–23)
CO2: 27 mmol/L (ref 22–32)
Calcium: 8.5 mg/dL — ABNORMAL LOW (ref 8.9–10.3)
Chloride: 95 mmol/L — ABNORMAL LOW (ref 98–111)
Creatinine, Ser: 0.69 mg/dL (ref 0.44–1.00)
GFR, Estimated: >60 mL/min (ref >60)
Glucose, Bld: 139 mg/dL — ABNORMAL HIGH (ref 70–99)
Potassium: 4.2 mmol/L (ref 3.5–5.1)
Sodium: 130 mmol/L — ABNORMAL LOW (ref 135–145)

## 2024-04-21 LAB — CBC
HCT: 29.3 % — ABNORMAL LOW (ref 36.0–46.0)
Hemoglobin: 9.9 g/dL — ABNORMAL LOW (ref 12.0–15.0)
MCH: 31.4 pg (ref 26.0–34.0)
MCHC: 33.8 g/dL (ref 30.0–36.0)
MCV: 93 fL (ref 80.0–100.0)
Platelets: 260 10*3/uL (ref 150–400)
RBC: 3.15 MIL/uL — ABNORMAL LOW (ref 3.87–5.11)
RDW: 12.3 % (ref 11.5–15.5)
WBC: 16 10*3/uL — ABNORMAL HIGH (ref 4.0–10.5)
nRBC: 0 % (ref 0.0–0.2)

## 2024-04-21 MED ORDER — HYDROCODONE-ACETAMINOPHEN 5-325 MG PO TABS
1.0000 | ORAL_TABLET | Freq: Four times a day (QID) | ORAL | 0 refills | Status: DC | PRN
Start: 2024-04-21 — End: 2024-08-16
  Filled 2024-04-21: qty 42, 6d supply, fill #0

## 2024-04-21 MED ORDER — METHOCARBAMOL 500 MG PO TABS
500.0000 mg | ORAL_TABLET | Freq: Four times a day (QID) | ORAL | 0 refills | Status: DC | PRN
  Filled 2024-04-21: qty 40, 10d supply, fill #0

## 2024-04-21 MED ORDER — TRAMADOL HCL 50 MG PO TABS
50.0000 mg | ORAL_TABLET | Freq: Four times a day (QID) | ORAL | 0 refills | Status: DC | PRN
  Filled 2024-04-21: qty 40, 5d supply, fill #0
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
## 2024-04-21 NOTE — Care Management Obs Status (Signed)
 MEDICARE OBSERVATION STATUS NOTIFICATION   Patient Details  Name: Tabitha Murphy MRN: [id number] Date of Birth: [birth year]   Medicare Observation Status Notification Given:       Bari Leys, RN 04/21/2024, 10:30 AM

## 2024-04-21 NOTE — Progress Notes (Signed)
   Subjective: 1 Day Post-Op Procedure(s) (LRB): ARTHROPLASTY, HIP, TOTAL, ANTERIOR APPROACH (Left) Patient seen in rounds by Dr. France Ina. Patient is well. Had difficulty with pain yesterday but this morning it is improving. Denies SOB or chest pain. Denies calf pain. We will continue physical therapy today.   Objective: Vital signs in last 24 hours: Temp:  [97 F (36.1 C)-98.1 F (36.7 C)] 97.5 F (36.4 C) (05/22 0620) Pulse Rate:  [63-86] 72 (05/22 0620) Resp:  [10-18] 17 (05/22 0620) BP: (117-187)/(59-120) 120/76 (05/22 0620) SpO2:  [90 %-100 %] 99 % (05/22 0620) Weight:  [70 kg] 70 kg (05/21 1011)  Intake/Output from previous day:  Intake/Output Summary (Last 24 hours) at 04/21/2024 0738 Last data filed at 04/21/2024 0620 Gross per 24 hour  Intake 3038.57 ml  Output 1200 ml  Net 1838.57 ml     Intake/Output this shift: No intake/output data recorded.  Labs: No results for input(s): "HGB" in the last 72 hours. No results for input(s): "WBC", "RBC", "HCT", "PLT" in the last 72 hours. Recent Labs    04/20/24 1056  NA 132*  K 3.4*  CL 96*  CO2 26  BUN 11  CREATININE 0.64  GLUCOSE 105*  CALCIUM  9.1   No results for input(s): "LABPT", "INR" in the last 72 hours.  Exam: General - Patient is Alert and Oriented Extremity - Neurologically intact Neurovascular intact Sensation intact distally Dorsiflexion/Plantar flexion intact Dressing - dressing C/D/I Motor Function - intact, moving foot and toes well on exam.  Past Medical History:  Diagnosis Date   Anxiety    Arthritis    Benign essential HTN    CAD (coronary artery disease), native coronary artery    Cath 2012 with 30% LCx; Coronary CTA showed minimal mixed nonobstructive disease.  Less than 25% ostial LAD and left circumflex with coronary calcium  score 41.7 on  12/2022   Complication of anesthesia    slow to wake up   DCM (dilated cardiomyopathy) (HCC)    ? tachy mediated from afib with RVR.  EF 45-50%  by echo 01/2021.  Lexiscan  myoview  with no ischemia   Diverticulosis    Dyspnea    Heart murmur    Hiatal hernia    HLD (hyperlipidemia)    Persistent atrial fibrillation (HCC)    On DOAC   PONV (postoperative nausea and vomiting)    Pre-diabetes    S/P laparoscopic cholecystectomy 07/28/2011   23 years ago   Sleep apnea    cpap   Thyroid  goiter     Assessment/Plan: 1 Day Post-Op Procedure(s) (LRB): ARTHROPLASTY, HIP, TOTAL, ANTERIOR APPROACH (Left) Principal Problem:   OA (osteoarthritis) of hip Active Problems:   Primary osteoarthritis of left hip  Estimated body mass index is 28.69 kg/m as calculated from the following:   Height as of this encounter: 5' 1.5" (1.562 m).   Weight as of this encounter: 70 kg. Advance diet Up with therapy D/C IV fluids  DVT Prophylaxis - Eliquis  Weight bearing as tolerated.  Continue physical therapy. Possible discharge home today pending progress with physical therapy, if meeting patient goals, and pain well managed. Will do HEP once discharged. Follow-up in clinic in 2 weeks.  The PDMP database was reviewed today prior to any opioid medications being prescribed to this patient.  R. Brinton Canavan, PA-C Orthopedic Surgery 04/21/2024, 7:38 AM
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
## 2024-04-21 NOTE — Progress Notes (Signed)
   04/21/24 2147  BiPAP/CPAP/SIPAP  $ Non-Invasive Home Ventilator  Initial  BiPAP/CPAP/SIPAP Pt Type Adult  BiPAP/CPAP/SIPAP DREAMSTATIOND  Mask Type Nasal pillows  Dentures removed? Not applicable  FiO2 (%) 21 %  Patient Home Machine No  Patient Home Mask Yes  Patient Home Tubing Yes  Auto Titrate Yes  Minimum cmH2O 8 cmH2O  Maximum cmH2O 20 cmH2O  Device Plugged into RED Power Outlet Yes  BiPAP/CPAP /SiPAP Vitals  Pulse Rate 74  Resp 16  SpO2 99 %  Bilateral Breath Sounds Clear  MEWS Score/Color  MEWS Score 0  MEWS Score Color Tabitha Murphy

## 2024-04-21 NOTE — TOC Transition Note (Addendum)
 Transition of Care [facility]) - Discharge Note   Patient Details  Name: Tabitha Murphy MRN: [id number] Date of Birth: 1941/06/20  [facility] CM/SW Contact:  Bari Leys, RN Phone Number: 04/21/2024, 11:19 AM   Clinical Narrative:   Met with patient at bedside to review dc therapy and home equipment needs, pt confirmed HEP , RW delivered to room by [employer].  MOON completed. No TOC needs.     Final next level of care: Home/Self Care     Patient Goals and CMS Choice Patient states their goals for this hospitalization and ongoing recovery are:: return home          Discharge Placement                       Discharge Plan and Services Additional resources added to the After Visit Summary for                  DME Arranged: Walker rolling DME Agency: [employer]                  Social Drivers of Health (SDOH) Interventions SDOH Screenings   Food Insecurity: No Food Insecurity (04/20/2024)  Housing: Low Risk  (04/20/2024)  Transportation Needs: No Transportation Needs (04/20/2024)  Utilities: Not At Risk (04/20/2024)  Social Connections: Moderately Integrated (04/20/2024)  Tobacco Use: Low Risk  (04/20/2024)     Readmission Risk Interventions     No data to display

## 2024-04-22 ENCOUNTER — Encounter (HOSPITAL_COMMUNITY): Payer: Self-pay | Admitting: Orthopedic Surgery

## 2024-04-22 ENCOUNTER — Other Ambulatory Visit (HOSPITAL_COMMUNITY): Payer: Self-pay

## 2024-04-22 DIAGNOSIS — M25552 Pain in left hip: Secondary | ICD-10-CM | POA: Diagnosis not present

## 2024-04-22 DIAGNOSIS — M1612 Unilateral primary osteoarthritis, left hip: Secondary | ICD-10-CM | POA: Diagnosis not present

## 2024-04-22 LAB — CBC
HCT: 28.5 % — ABNORMAL LOW (ref 36.0–46.0)
Hemoglobin: 9.5 g/dL — ABNORMAL LOW (ref 12.0–15.0)
MCH: 31.8 pg (ref 26.0–34.0)
MCHC: 33.3 g/dL (ref 30.0–36.0)
MCV: 95.3 fL (ref 80.0–100.0)
Platelets: 230 10*3/uL (ref 150–400)
RBC: 2.99 MIL/uL — ABNORMAL LOW (ref 3.87–5.11)
RDW: 12.3 % (ref 11.5–15.5)
WBC: 16.6 10*3/uL — ABNORMAL HIGH (ref 4.0–10.5)
nRBC: 0 % (ref 0.0–0.2)

## 2024-04-22 NOTE — Progress Notes (Signed)
   Subjective: 2 Days Post-Op Procedure(s) (LRB): ARTHROPLASTY, HIP, TOTAL, ANTERIOR APPROACH (Left) Patient reports pain as mild.   Patient seen in rounds by Dr. France Ina. Patient is well, and has had no acute complaints or problems Plan is to go Home after hospital stay.  Objective: Vital signs in last 24 hours: Temp:  [97.8 F (36.6 C)-98.7 F (37.1 C)] 98.7 F (37.1 C) (05/23 0601) Pulse Rate:  [65-80] 65 (05/23 0601) Resp:  [14-18] 18 (05/23 0601) BP: (109-144)/(61-74) 138/66 (05/23 0601) SpO2:  [96 %-99 %] 96 % (05/23 0601) FiO2 (%):  [21 %] 21 % (05/22 2147)  Intake/Output from previous day:  Intake/Output Summary (Last 24 hours) at 04/22/2024 0734 Last data filed at 04/22/2024 0600 Gross per 24 hour  Intake 1647.74 ml  Output 1000 ml  Net 647.74 ml    Intake/Output this shift: No intake/output data recorded.  Labs: Recent Labs    04/21/24 0739 04/22/24 0340  HGB 9.9* 9.5*   Recent Labs    04/21/24 0739 04/22/24 0340  WBC 16.0* 16.6*  RBC 3.15* 2.99*  HCT 29.3* 28.5*  PLT 260 230   Recent Labs    04/20/24 1056 04/21/24 0739  NA 132* 130*  K 3.4* 4.2  CL 96* 95*  CO2 26 27  BUN 11 11  CREATININE 0.64 0.69  GLUCOSE 105* 139*  CALCIUM  9.1 8.5*   No results for input(s): "LABPT", "INR" in the last 72 hours.  Exam: General - Patient is Alert and Oriented Extremity - Neurologically intact Neurovascular intact Sensation intact distally Dorsiflexion/Plantar flexion intact Dressing/Incision - clean, dry, no drainage Motor Function - intact, moving foot and toes well on exam.   Past Medical History:  Diagnosis Date   Anxiety    Arthritis    Benign essential HTN    CAD (coronary artery disease), native coronary artery    Cath 2012 with 30% LCx; Coronary CTA showed minimal mixed nonobstructive disease.  Less than 25% ostial LAD and left circumflex with coronary calcium  score 41.7 on  12/2022   Complication of anesthesia    slow to wake up   DCM  (dilated cardiomyopathy) (HCC)    ? tachy mediated from afib with RVR.  EF 45-50% by echo 01/2021.  Lexiscan  myoview  with no ischemia   Diverticulosis    Dyspnea    Heart murmur    Hiatal hernia    HLD (hyperlipidemia)    Persistent atrial fibrillation (HCC)    On DOAC   PONV (postoperative nausea and vomiting)    Pre-diabetes    S/P laparoscopic cholecystectomy 07/28/2011   23 years ago   Sleep apnea    cpap   Thyroid  goiter     Assessment/Plan: 2 Days Post-Op Procedure(s) (LRB): ARTHROPLASTY, HIP, TOTAL, ANTERIOR APPROACH (Left) Principal Problem:   OA (osteoarthritis) of hip Active Problems:   Primary osteoarthritis of left hip  Estimated body mass index is 28.69 kg/m as calculated from the following:   Height as of this encounter: 5' 1.5" (1.562 m).   Weight as of this encounter: 70 kg. Up with therapy  DVT Prophylaxis - Eliquis  Weight-bearing as tolerated  Discharge to home with HEP  Tabitha Dear, PA-C Orthopedic Surgery [phone] 04/22/2024, 7:34 AM

## 2024-04-22 NOTE — Progress Notes (Signed)
 Discharge medications delivered to patient at bedside [facility]
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
## 2024-04-22 NOTE — Plan of Care (Signed)
  Problem: Education: Goal: Knowledge of General Education information will improve Description: Including pain rating scale, medication(s)/side effects and non-pharmacologic comfort measures 04/22/2024 1120 by Venice Gillis A, LPN Outcome: Adequate for Discharge 04/22/2024 1120 by Venice Gillis A, LPN Outcome: Adequate for Discharge   Problem: Health Behavior/Discharge Planning: Goal: Ability to manage health-related needs will improve Outcome: Adequate for Discharge   Problem: Clinical Measurements: Goal: Ability to maintain clinical measurements within normal limits will improve Outcome: Adequate for Discharge Goal: Will remain free from infection Outcome: Adequate for Discharge Goal: Diagnostic test results will improve Outcome: Adequate for Discharge Goal: Respiratory complications will improve Outcome: Adequate for Discharge Goal: Cardiovascular complication will be avoided Outcome: Adequate for Discharge   Problem: Activity: Goal: Risk for activity intolerance will decrease Outcome: Adequate for Discharge   Problem: Nutrition: Goal: Adequate nutrition will be maintained Outcome: Adequate for Discharge   Problem: Coping: Goal: Level of anxiety will decrease Outcome: Adequate for Discharge   Problem: Elimination: Goal: Will not experience complications related to bowel motility Outcome: Adequate for Discharge Goal: Will not experience complications related to urinary retention Outcome: Adequate for Discharge   Problem: Pain Managment: Goal: General experience of comfort will improve and/or be controlled Outcome: Adequate for Discharge   Problem: Safety: Goal: Ability to remain free from injury will improve Outcome: Adequate for Discharge   Problem: Skin Integrity: Goal: Risk for impaired skin integrity will decrease Outcome: Adequate for Discharge   Problem: Education: Goal: Knowledge of the prescribed therapeutic regimen will improve Outcome: Adequate for  Discharge Goal: Understanding of discharge needs will improve Outcome: Adequate for Discharge Goal: Individualized Educational Video(s) Outcome: Adequate for Discharge   Problem: Activity: Goal: Ability to avoid complications of mobility impairment will improve Outcome: Adequate for Discharge Goal: Ability to tolerate increased activity will improve Outcome: Adequate for Discharge   Problem: Clinical Measurements: Goal: Postoperative complications will be avoided or minimized Outcome: Adequate for Discharge   Problem: Pain Management: Goal: Pain level will decrease with appropriate interventions Outcome: Adequate for Discharge   Problem: Skin Integrity: Goal: Will show signs of wound healing Outcome: Adequate for Discharge

## 2024-04-27 ENCOUNTER — Other Ambulatory Visit: Payer: Self-pay | Admitting: Physician Assistant

## 2024-04-27 DIAGNOSIS — I10 Essential (primary) hypertension: Secondary | ICD-10-CM

## 2024-05-02 NOTE — Discharge Summary (Signed)
 Patient ID: Tabitha Murphy MRN: [id number] DOB/AGE: 1941-10-28 83 y.o.  Admit date: 04/20/2024 Discharge date: 04/22/2024  Admission Diagnoses:  Principal Problem:   OA (osteoarthritis) of hip Active Problems:   Primary osteoarthritis of left hip   Discharge Diagnoses:  Same  Past Medical History:  Diagnosis Date   Anxiety    Arthritis    Benign essential HTN    CAD (coronary artery disease), native coronary artery    Cath 2012 with 30% LCx; Coronary CTA showed minimal mixed nonobstructive disease.  Less than 25% ostial LAD and left circumflex with coronary calcium  score 41.7 on  12/2022   Complication of anesthesia    slow to wake up   DCM (dilated cardiomyopathy) (HCC)    ? tachy mediated from afib with RVR.  EF 45-50% by echo 01/2021.  Lexiscan  myoview  with no ischemia   Diverticulosis    Dyspnea    Heart murmur    Hiatal hernia    HLD (hyperlipidemia)    Persistent atrial fibrillation (HCC)    On DOAC   PONV (postoperative nausea and vomiting)    Pre-diabetes    S/P laparoscopic cholecystectomy 07/28/2011   23 years ago   Sleep apnea    cpap   Thyroid  goiter     Surgeries: Procedure(s): ARTHROPLASTY, HIP, TOTAL, ANTERIOR APPROACH on 04/20/2024   Consultants:   Discharged Condition: Improved  Hospital Course: Tabitha Murphy is an 83 y.o. female who was admitted 04/20/2024 for operative treatment ofOA (osteoarthritis) of hip. Patient has severe unremitting pain that affects sleep, daily activities, and work/hobbies. After pre-op clearance the patient was taken to the operating room on 04/20/2024 and underwent  Procedure(s): ARTHROPLASTY, HIP, TOTAL, ANTERIOR APPROACH.    Patient was given perioperative antibiotics:  Anti-infectives (From admission, onward)    Start     Dose/Rate Route Frequency Ordered Stop   04/20/24 1800  ceFAZolin  (ANCEF ) IVPB 2g/100 mL premix        2 g 200 mL/hr over 30 Minutes Intravenous Every 6 hours 04/20/24 1542 04/20/24 2340    04/20/24 1015  ceFAZolin  (ANCEF ) IVPB 2g/100 mL premix        2 g 200 mL/hr over 30 Minutes Intravenous On call to O.R. 04/20/24 1008 04/20/24 1214        Patient was given sequential compression devices, early ambulation, and chemoprophylaxis to prevent DVT.  Patient benefited maximally from hospital stay and there were no complications.    Recent vital signs: No data found.   Recent laboratory studies: No results for input(s): "WBC", "HGB", "HCT", "PLT", "NA", "K", "CL", "CO2", "BUN", "CREATININE", "GLUCOSE", "INR", "CALCIUM " in the last 72 hours.  Invalid input(s): "PT", "2"   Discharge Medications:   Allergies as of 04/22/2024       Reactions   Doxazosin Mesylate Shortness Of Breath   Other reaction(s): SOB   Oxycodone Hcl Other (See Comments)   Can take with promethazine     Amlodipine Besylate    Other reaction(s): swelling along with aches and pains   Codeine    REACTION: nausea   Crest [place] Control    Other reaction(s): skin around mouth comes off   Cymbalta  [duloxetine  Hcl]    Other reaction(s): Heaviness and pains in chest and back/dry mouth   Duloxetine  Diarrhea   Gabapentin Swelling   Other reaction(s): causes aches and pains Knee swelling   Lyrica  [pregabalin ] Other (See Comments)   Made pain in legs and feet worse.   Rosuvastatin  Calcium  Other (See Comments)  Other reaction(s): knee and leg pains Patient taking 5 mg with not problem   Zolpidem Tartrate    Other reaction(s): retrograde amnesia        Medication List     TAKE these medications    albuterol  108 (90 Base) MCG/ACT inhaler Commonly known as: VENTOLIN  HFA Inhale 2 puffs into the lungs every 4 (four) hours as needed for wheezing or shortness of breath.   ALPRAZolam  0.5 MG tablet Commonly known as: XANAX  Take 0.5 mg by mouth daily as needed for anxiety or sleep.   atenolol  50 MG tablet Commonly known as: TENORMIN  Take 1 tablet (50 mg total) by mouth 2 (two) times daily.    Betasept  Surgical Scrub 4 % external liquid Generic drug: chlorhexidine  Apply 15 mLs (1 Application total) topically as directed for 30 doses. Use as directed daily for 5 days every other week for 6 weeks.   Biotin 82956 MCG Tbdp Take 10,000 mcg by mouth daily.   CALCIUM -VITAMIN D PO Take 1 tablet by mouth daily.   Eliquis  5 MG Tabs tablet Generic drug: apixaban  TAKE 1 TABLET BY MOUTH TWICE A DAY   furosemide  20 MG tablet Commonly known as: LASIX  Take one tablet (20 mg) as needed for swelling but call the office if taking more than twice a week.   GLUCOSAMINE CHOND MSM FORMULA PO Take 2 tablets by mouth daily.   hydrochlorothiazide  12.5 MG capsule Commonly known as: MICROZIDE  TAKE 1 CAPSULE BY MOUTH EVERY DAY   HYDROcodone -acetaminophen  5-325 MG tablet Commonly known as: NORCO/VICODIN Take 1-2 tablets by mouth every 6 (six) hours as needed for severe pain (pain score 7-10). What changed: how much to take   hydrOXYzine  25 MG tablet Commonly known as: ATARAX  Take 25 mg by mouth daily as needed for itching.   irbesartan  300 MG tablet Commonly known as: AVAPRO  Take 300 mg by mouth every morning.   levothyroxine  25 MCG tablet Commonly known as: SYNTHROID  Take 25 mcg by mouth daily before breakfast.   lidocaine -prilocaine  cream Commonly known as: EMLA  Apply 1 Application topically 2 (two) times daily as needed (body pain).   METAMUCIL FIBER PO Take 3 each by mouth daily. Chewables   methocarbamol  500 MG tablet Commonly known as: ROBAXIN  Take 1 tablet (500 mg total) by mouth every 6 (six) hours as needed for muscle spasms.   multivitamin tablet Take 1 tablet by mouth daily.  Centrum   mupirocin  ointment 2 % Commonly known as: BACTROBAN  Place 1 Application into the nose 2 (two) times daily for 60 doses. Use as directed 2 times daily for 5 days every other week for 6 weeks.   omeprazole  20 MG tablet Commonly known as: PRILOSEC  OTC Take 10 mg by mouth daily  before breakfast.   promethazine  25 MG tablet Commonly known as: PHENERGAN  Take 25 mg by mouth every 6 (six) hours as needed for nausea or vomiting.   rosuvastatin  5 MG tablet Commonly known as: CRESTOR  TAKE 1 TABLET (5 MG TOTAL) BY MOUTH DAILY. What changed: when to take this   Selenium 100 MCG Tabs Take 100 mcg by mouth daily.   SUPER B COMPLEX PO Take 1 tablet by mouth daily.   Systane 0.4-0.3 % Soln Generic drug: Polyethyl Glycol-Propyl Glycol Place 1 drop into both eyes daily as needed (Dry eyes).   traMADol  50 MG tablet Commonly known as: ULTRAM  Take 1-2 tablets (50-100 mg total) by mouth every 6 (six) hours as needed for moderate pain (pain score 4-6).  valACYclovir 1000 MG tablet Commonly known as: VALTREX Take 2,000 mg by mouth 2 (two) times daily as needed (fever blisters).   VITAMIN C PO Take 1 tablet by mouth daily.               Discharge Care Instructions  (From admission, onward)           Start     Ordered   04/21/24 0000  Weight bearing as tolerated        04/21/24 0741   04/21/24 0000  Change dressing       Comments: You have an adhesive waterproof bandage over the incision. Leave this in place until your first follow-up appointment. Once you remove this you will not need to place another bandage.   04/21/24 0741            Diagnostic Studies: DG Pelvis Portable Result Date: 04/20/2024 CLINICAL DATA:  Status post total left hip arthroplasty. EXAM: PORTABLE PELVIS 1-2 VIEWS COMPARISON:  Lumbar spine radiographs 07/03/2020 FINDINGS: Interval total left hip arthroplasty. No perihardware lucency is seen to indicate hardware failure or loosening. Severe right femoroacetabular joint space narrowing, subchondral sclerosis, subchondral cystic change, and peripheral osteophytosis. Moderate bilateral sacroiliac and pubic symphysis joint space narrowing, subchondral sclerosis, and peripheral osteophytosis. Mild lateral left hip subcutaneous air  expected postoperative change. No acute fracture or dislocation. IMPRESSION: 1. Interval total left hip arthroplasty without evidence of hardware failure. 2. Severe right femoroacetabular osteoarthritis. Electronically Signed   By: Bertina Broccoli M.D.   On: 04/20/2024 15:41   DG HIP UNILAT WITH PELVIS 1V LEFT Result Date: 04/20/2024 CLINICAL DATA:  Left hip arthroplasty.  Intraoperative fluoroscopy. EXAM: DG HIP (WITH OR WITHOUT PELVIS) 1V*L* COMPARISON:  None Available. FINDINGS: Images were performed intraoperatively without the presence of a radiologist. Severe left greater than right femoroacetabular joint space narrowing, subchondral sclerosis, and peripheral osteophytosis. The patient is undergoing total left hip arthroplasty. No hardware complication is seen. Total fluoroscopy images: 7 Total fluoroscopy time: 12 seconds Total dose: Radiation Exposure Index (as provided by the fluoroscopic device): 0.95 mGy air Kerma Please see intraoperative findings for further detail. IMPRESSION: Intraoperative fluoroscopy for total left hip arthroplasty. Electronically Signed   By: Bertina Broccoli M.D.   On: 04/20/2024 15:39   DG C-Arm 1-60 Min-No Report Result Date: 04/20/2024 Fluoroscopy was utilized by the requesting physician.  No radiographic interpretation.   DG C-Arm 1-60 Min-No Report Result Date: 04/20/2024 Fluoroscopy was utilized by the requesting physician.  No radiographic interpretation.    Disposition: Discharge disposition: 01-Home or Self Care       Discharge Instructions     Call MD / Call 911   Complete by: As directed    If you experience chest pain or shortness of breath, CALL 911 and be transported to the hospital emergency room.  If you develope a fever above 101 F, pus (white drainage) or increased drainage or redness at the wound, or calf pain, call your surgeon's office.   Change dressing   Complete by: As directed    You have an adhesive waterproof bandage over the  incision. Leave this in place until your first follow-up appointment. Once you remove this you will not need to place another bandage.   Constipation Prevention   Complete by: As directed    Drink plenty of fluids.  Prune juice may be helpful.  You may use a stool softener, such as Colace (over the counter) 100 mg twice a day.  Use  MiraLax  (over the counter) for constipation as needed.   Diet - low sodium heart healthy   Complete by: As directed    Do not sit on low chairs, stoools or toilet seats, as it may be difficult to get up from low surfaces   Complete by: As directed    Driving restrictions   Complete by: As directed    No driving for two weeks   Post-operative opioid taper instructions:   Complete by: As directed    POST-OPERATIVE OPIOID TAPER INSTRUCTIONS: It is important to wean off of your opioid medication as soon as possible. If you do not need pain medication after your surgery it is ok to stop day one. Opioids include: Codeine, Hydrocodone (Norco, Vicodin), Oxycodone(Percocet, oxycontin) and hydromorphone amongst others.  Long term and even short term use of opiods can cause: Increased pain response Dependence Constipation Depression Respiratory depression And more.  Withdrawal symptoms can include Flu like symptoms Nausea, vomiting And more Techniques to manage these symptoms Hydrate well Eat regular healthy meals Stay active Use relaxation techniques(deep breathing, meditating, yoga) Do Not substitute Alcohol to help with tapering If you have been on opioids for less than two weeks and do not have pain than it is ok to stop all together.  Plan to wean off of opioids This plan should start within one week post op of your joint replacement. Maintain the same interval or time between taking each dose and first decrease the dose.  Cut the total daily intake of opioids by one tablet each day Next start to increase the time between doses. The last dose that should  be eliminated is the evening dose.      TED hose   Complete by: As directed    Use stockings (TED hose) for three weeks on both leg(s).  You may remove them at night for sleeping.   Weight bearing as tolerated   Complete by: As directed         Follow-up Information     Liliane Rei, MD. Go on 05/03/2024.   Specialty: Orthopedic Surgery Why: You are scheduled for first post op appt on Tuesday June 3 at 2:00pm. Contact information: [address] [place] [address] [place] [address] [phone]                  Signed: Sharlynn Dear 05/02/2024, 9:20 AM

## 2024-05-13 DIAGNOSIS — Z0000 Encounter for general adult medical examination without abnormal findings: Secondary | ICD-10-CM | POA: Diagnosis not present

## 2024-05-21 ENCOUNTER — Other Ambulatory Visit: Payer: Self-pay | Admitting: Physician Assistant

## 2024-05-21 ENCOUNTER — Other Ambulatory Visit (HOSPITAL_BASED_OUTPATIENT_CLINIC_OR_DEPARTMENT_OTHER): Payer: Self-pay | Admitting: Family

## 2024-05-21 DIAGNOSIS — I10 Essential (primary) hypertension: Secondary | ICD-10-CM

## 2024-05-25 DIAGNOSIS — Z5189 Encounter for other specified aftercare: Secondary | ICD-10-CM | POA: Diagnosis not present

## 2024-06-21 ENCOUNTER — Emergency Department (HOSPITAL_COMMUNITY)

## 2024-06-21 ENCOUNTER — Telehealth (HOSPITAL_COMMUNITY): Payer: Self-pay | Admitting: *Deleted

## 2024-06-21 ENCOUNTER — Other Ambulatory Visit: Payer: Self-pay

## 2024-06-21 ENCOUNTER — Emergency Department (HOSPITAL_COMMUNITY)
Admission: EM | Admit: 2024-06-21 | Discharge: 2024-06-22 | Disposition: A | Discharge status: Home / Self Care | Attending: Emergency Medicine | Admitting: Emergency Medicine

## 2024-06-21 ENCOUNTER — Telehealth: Payer: Self-pay | Admitting: Cardiology

## 2024-06-21 ENCOUNTER — Encounter (HOSPITAL_COMMUNITY): Payer: Self-pay

## 2024-06-21 DIAGNOSIS — R0789 Other chest pain: Secondary | ICD-10-CM | POA: Insufficient documentation

## 2024-06-21 DIAGNOSIS — I700 Atherosclerosis of aorta: Secondary | ICD-10-CM | POA: Diagnosis not present

## 2024-06-21 DIAGNOSIS — I10 Essential (primary) hypertension: Secondary | ICD-10-CM | POA: Diagnosis not present

## 2024-06-21 DIAGNOSIS — I517 Cardiomegaly: Secondary | ICD-10-CM | POA: Diagnosis not present

## 2024-06-21 DIAGNOSIS — Z7901 Long term (current) use of anticoagulants: Secondary | ICD-10-CM | POA: Diagnosis not present

## 2024-06-21 DIAGNOSIS — R079 Chest pain, unspecified: Secondary | ICD-10-CM | POA: Diagnosis not present

## 2024-06-21 LAB — CBC
HCT: 35.5 % — ABNORMAL LOW (ref 36.0–46.0)
Hemoglobin: 11.7 g/dL — ABNORMAL LOW (ref 12.0–15.0)
MCH: 29.8 pg (ref 26.0–34.0)
MCHC: 33 g/dL (ref 30.0–36.0)
MCV: 90.6 fL (ref 80.0–100.0)
Platelets: 354 K/uL (ref 150–400)
RBC: 3.92 MIL/uL (ref 3.87–5.11)
RDW: 13.4 % (ref 11.5–15.5)
WBC: 8.7 K/uL (ref 4.0–10.5)
nRBC: 0 % (ref 0.0–0.2)

## 2024-06-21 LAB — BASIC METABOLIC PANEL WITH GFR
Anion gap: 13 (ref 5–15)
BUN: 9 mg/dL (ref 8–23)
CO2: 28 mmol/L (ref 22–32)
Calcium: 9.4 mg/dL (ref 8.9–10.3)
Chloride: 93 mmol/L — ABNORMAL LOW (ref 98–111)
Creatinine, Ser: 0.78 mg/dL (ref 0.44–1.00)
GFR, Estimated: >60 mL/min (ref >60)
Glucose, Bld: 108 mg/dL — ABNORMAL HIGH (ref 70–99)
Potassium: 3.8 mmol/L (ref 3.5–5.1)
Sodium: 134 mmol/L — ABNORMAL LOW (ref 135–145)

## 2024-06-21 LAB — TROPONIN I (HIGH SENSITIVITY)
Troponin I (High Sensitivity): 13 ng/L (ref <18)
Troponin I (High Sensitivity): 14 ng/L (ref <18)

## 2024-06-21 NOTE — Telephone Encounter (Signed)
 Pt called c/o chest pain since yesterday. Stated she took xanax  twice thru out the day. Pt currently pain free Afib controlled HR 80's pt did not check BP during episodes. Pt instructed to call primary cardiology Dr. Shlomo to report phone number provided. Pt verbalized agreement and was going to immediately call.

## 2024-06-21 NOTE — Telephone Encounter (Signed)
 Called and spoke with patient. Patient states that since hip surgery on 04/20/24 she has been having chest pain almost every day. She has experienced chest tightness off and on since then but the chest pain has been getting worse. States she has been using her inhaler a lot lately because of shortness of breath. Patient states that last night she felt chest pain over her heart that was the worst it has ever been and lasted the longest. She endorses she also had shortness of breath, nausea and new left arm pain. Patient reports the symptoms were experienced when at rest and lasted for a few hours. Patient reports she took one xanax  yesterday and another half of a xanax  last night. She reports she used her inhaler 3-4 times yesterday. Patient does not know if it helped or not. Patient reports no chest or arm pain this morning. States she is not feeling bad right now. She states she used her inhaler one time this morning. Patient was strongly advised to go to the emergency room or to call EMS to be evaluated. Patient said no. Advised patient again that she should go to the emergency room to be evaluated. Patient stated she did not want to go to emergency room. Further recommended being seen at urgent care, med center or to hang up and call PCP. Patient was scheduled for first available appointment on July 25th with Dr. [place] at 2:40 pm.

## 2024-06-21 NOTE — Telephone Encounter (Signed)
 Pt c/o of Chest Pain: STAT if active (IN THIS MOMENT) CP, including tightness, pressure, jaw pain, shoulder/upper arm/back pain, SOB, nausea, and vomiting.  1. Are you having CP right now (tightness, pressure, or discomfort)? Feeling all 3  2. Are you experiencing any other symptoms (ex. SOB, nausea, vomiting, sweating)? Sob, sweating, vomiting, headaches  3. How long have you been experiencing CP? Since her hip surgery but it gotten worst  4. Is your CP continuous or coming and going? Coming and going   5. Have you taken Nitroglycerin ? no  6. If CP returns before callback, please consider calling 911. ?

## 2024-06-21 NOTE — Telephone Encounter (Signed)
 Shlomo Wilbert SAUNDERS, MD to [facility] Triage     06/21/24  2:22 PM Needs to go to ER TODAY  Patient notified.  She is currently at [facility] walk in [facility].  Patient reports she will be at office visit on Friday

## 2024-06-21 NOTE — ED Triage Notes (Signed)
 Pt from home for substernal CP that started 3 weeks ago, radiates to left arm. Pt had recent hip surgery. Pt on eliquis . Pt refused aspirin . Denies SHOB. VSS.

## 2024-06-21 NOTE — ED Provider Triage Note (Signed)
 Emergency Medicine Provider Triage Evaluation Note  Tabitha Murphy , a 83 y.o. female  was evaluated in triage.  Pt complains of chest pain that has been intermittent since she was discharged from the hospital several weeks ago.  Was at her doctor's office, endorsed chest pain, sent to ED.Tabitha Murphy  Review of Systems    Physical Exam  BP (!) 147/74 (BP Location: Left Arm)   Pulse 67   Temp 98.8 F (37.1 C)   Resp 15   Ht 5' 2 (1.575 m)   Wt 72.6 kg   SpO2 98%   BMI 29.26 kg/m  Gen:   Awake, no distress   Resp:  Normal effort  MSK:   Moves extremities without difficulty  Other:    Medical Decision Making  Medically screening exam initiated at 6:12 PM.  Appropriate orders placed.  Tabitha Murphy was informed that the remainder of the evaluation will be completed by another provider, this initial triage assessment does not replace that evaluation, and the importance of remaining in the ED until their evaluation is complete.  83 year old female here today for chest pain.   Tabitha Pac T, DO 06/21/24 [phone]

## 2024-06-22 ENCOUNTER — Emergency Department (HOSPITAL_COMMUNITY)

## 2024-06-22 DIAGNOSIS — Z9049 Acquired absence of other specified parts of digestive tract: Secondary | ICD-10-CM | POA: Diagnosis not present

## 2024-06-22 DIAGNOSIS — R079 Chest pain, unspecified: Secondary | ICD-10-CM | POA: Diagnosis not present

## 2024-06-22 MED ORDER — IOHEXOL 350 MG/ML SOLN
75.0000 mL | Freq: Once | INTRAVENOUS | Status: AC | PRN
  Administered 2024-06-22: 75 mL via INTRAVENOUS

## 2024-06-22 NOTE — ED Provider Notes (Signed)
 [place] EMERGENCY DEPARTMENT AT [facility] Provider Note   CSN: [id number] Arrival date & time: 06/21/24  1639     Patient presents with: Chest Pain   Tabitha Murphy is a 83 y.o. female.   The history is provided by the patient and medical records.  Chest Pain Tabitha Murphy is a 83 y.o. female who presents to the Emergency Department complaining of chest pain.  She presents to the emergency department for evaluation of longstanding intermittent chest pain that she states has been present since she was 82 years old.  She reports an escalation in her pain after she had hip surgery on May 21.  She describes it as a central and left-sided pain that waxes and wanes.  Last night her pain worsened.  No significant change with movement or breathing.  She does feel short of breath at times.  She describes it as pressure on her chest intermittently.  She did have hip replacement on May 21.  She does have a history of persistent atrial fibrillation and takes Eliquis  as prescribed.       Prior to Admission medications   Medication Sig Start Date End Date Taking? Authorizing Provider  albuterol  (VENTOLIN  HFA) 108 (90 Base) MCG/ACT inhaler Inhale 2 puffs into the lungs every 4 (four) hours as needed for wheezing or shortness of breath. 03/18/20   [provider]  ALPRAZolam  (XANAX ) 0.5 MG tablet Take 0.5 mg by mouth daily as needed for anxiety or sleep.    [provider]  Ascorbic Acid (VITAMIN C PO) Take 1 tablet by mouth daily.    [provider]  atenolol  (TENORMIN ) 100 MG tablet TAKE 1/2 TABLET BY MOUTH TWICE A DAY 05/24/24   Shlomo Wilbert SAUNDERS, MD  atenolol  (TENORMIN ) 50 MG tablet Take 1 tablet (50 mg total) by mouth 2 (two) times daily. 07/21/23   Vannie Reche RAMAN, NP  B Complex-C (SUPER B COMPLEX PO) Take 1 tablet by mouth daily.    [provider]  Biotin 89999 MCG TBDP Take 10,000 mcg by mouth daily.    [provider]  CALCIUM -VITAMIN  D PO Take 1 tablet by mouth daily.    [provider]  chlorhexidine  (HIBICLENS ) 4 % external liquid Apply 15 mLs (1 Application total) topically as directed for 30 doses. Use as directed daily for 5 days every other week for 6 weeks. 04/20/24   Edmisten, Kristie L, PA  ELIQUIS  5 MG TABS tablet TAKE 1 TABLET BY MOUTH TWICE A DAY 03/04/23   Shlomo Wilbert SAUNDERS, MD  furosemide  (LASIX ) 20 MG tablet Take one tablet (20 mg) as needed for swelling but call the office if taking more than twice a week. 09/11/22   Vannie Reche RAMAN, NP  hydrochlorothiazide  (MICROZIDE ) 12.5 MG capsule TAKE 1 CAPSULE BY MOUTH EVERY DAY 06/25/23   Shlomo Wilbert SAUNDERS, MD  HYDROcodone -acetaminophen  (NORCO/VICODIN) 5-325 MG tablet Take 1-2 tablets by mouth every 6 (six) hours as needed for severe pain (pain score 7-10). 04/21/24   Kristian Stabs, PA  hydrOXYzine  (ATARAX ) 25 MG tablet Take 25 mg by mouth daily as needed for itching. 01/06/23   [provider]  irbesartan  (AVAPRO ) 300 MG tablet Take 300 mg by mouth every morning. 02/02/21   [provider]  levothyroxine  (SYNTHROID ) 25 MCG tablet Take 25 mcg by mouth daily before breakfast. 02/25/21   [provider]  lidocaine -prilocaine  (EMLA ) cream Apply 1 Application topically 2 (two) times daily as needed (body pain).  09/24/23   [provider]  METAMUCIL FIBER PO Take 3 each by mouth daily. Chewables    [provider]  methocarbamol  (ROBAXIN ) 500 MG tablet Take 1 tablet (500 mg total) by mouth every 6 (six) hours as needed for muscle spasms. 04/21/24   Kristian Stabs, PA  Misc Natural Products (GLUCOSAMINE CHOND MSM FORMULA PO) Take 2 tablets by mouth daily.    [provider]  Multiple Vitamin (MULTIVITAMIN) tablet Take 1 tablet by mouth daily.  Centrum    [provider]  omeprazole  (PRILOSEC  OTC) 20 MG tablet Take 10 mg by mouth daily before breakfast.    [provider]  Polyethyl Glycol-Propyl Glycol  (SYSTANE) 0.4-0.3 % SOLN Place 1 drop into both eyes daily as needed (Dry eyes).    [provider]  promethazine  (PHENERGAN ) 25 MG tablet Take 25 mg by mouth every 6 (six) hours as needed for nausea or vomiting.    [provider]  rosuvastatin  (CRESTOR ) 5 MG tablet TAKE 1 TABLET (5 MG TOTAL) BY MOUTH DAILY. Patient taking differently: Take 5 mg by mouth at bedtime. 02/20/23   Shlomo Wilbert SAUNDERS, MD  Selenium 100 MCG TABS Take 100 mcg by mouth daily.    [provider]  traMADol  (ULTRAM ) 50 MG tablet Take 1-2 tablets (50-100 mg total) by mouth every 6 (six) hours as needed for moderate pain (pain score 4-6). 04/21/24   Kristian Stabs, PA  valACYclovir (VALTREX) 1000 MG tablet Take 2,000 mg by mouth 2 (two) times daily as needed (fever blisters).    [provider]    Allergies: Doxazosin mesylate, Oxycodone hcl, Amlodipine besylate, Codeine, Crest tartar control, Cymbalta  [duloxetine  hcl], Duloxetine , Gabapentin, Lyrica  [pregabalin ], Rosuvastatin  calcium , and Zolpidem tartrate    Review of Systems  Cardiovascular:  Positive for chest pain.  All other systems reviewed and are negative.   Updated Vital Signs BP (!) 156/60   Pulse 74   Temp 97.9 F (36.6 C) (Oral)   Resp 18   Ht 5' 2 (1.575 m)   Wt 72.6 kg   SpO2 100%   BMI 29.26 kg/m   Physical Exam Vitals and nursing note reviewed.  Constitutional:      Appearance: She is well-developed.  HENT:     Head: Normocephalic and atraumatic.  Cardiovascular:     Rate and Rhythm: Normal rate and regular rhythm.     Heart sounds: No murmur heard. Pulmonary:     Effort: Pulmonary effort is normal. No respiratory distress.     Breath sounds: Normal breath sounds.  Abdominal:     Palpations: Abdomen is soft.     Tenderness: There is no abdominal tenderness. There is no guarding or rebound.  Musculoskeletal:        General: No tenderness.     Comments: Trace pitting edema to BLE  Skin:    General:  Skin is warm and dry.  Neurological:     Mental Status: She is alert and oriented to person, place, and time.  Psychiatric:        Behavior: Behavior normal.     (all labs ordered are listed, but only abnormal results are displayed) Labs Reviewed  BASIC METABOLIC PANEL WITH GFR - Abnormal; Notable for the following components:      Result Value   Sodium 134 (*)    Chloride 93 (*)    Glucose, Bld 108 (*)    All other components within normal limits  CBC - Abnormal; Notable for the  following components:   Hemoglobin 11.7 (*)    HCT 35.5 (*)    All other components within normal limits  TROPONIN I (HIGH SENSITIVITY)  TROPONIN I (HIGH SENSITIVITY)    EKG: EKG Interpretation Date/Time:  Tuesday June 21 2024 18:29:04 EDT Ventricular Rate:  84 PR Interval:    QRS Duration:  76 QT Interval:  384 QTC Calculation: 453 R Axis:   60  Text Interpretation: Atrial fibrillation Septal infarct , age undetermined Abnormal ECG Confirmed by Griselda Norris [phone]) on 06/22/2024 12:14:57 AM  Radiology: CT Angio Chest PE W/Cm &/Or Wo Cm Result Date: 06/22/2024 EXAM: CTA CHEST 06/22/2024 02:36:15 AM TECHNIQUE: CTA of the chest was performed after the administration of intravenous contrast. Multiplanar reformatted images are provided for review. MIP images are provided for review. Automated exposure control, iterative reconstruction, and/or weight based adjustment of the mA/kV was utilized to reduce the radiation dose to as low as reasonably achievable. COMPARISON: Cardiac CT dated 12/26/2022. CLINICAL HISTORY: Pulmonary embolism (PE) suspected, high prob. 75cc [id number]; Chief complaints; Chest Pain; CT Angio Chest PE W/Cm \\T \/Or Wo Cm; Pulmonary embolism (PE) suspected, high prob. FINDINGS: PULMONARY ARTERIES: Pulmonary arteries are adequately opacified for evaluation. No evidence of pulmonary embolism. MEDIASTINUM: The heart and pericardium demonstrate no acute abnormality. There is no thoracic aortic  aneurysm or dissection. Thoracic cardiac atherosclerosis. LUNGS AND PLEURA: 3 mm subpleural nodule in the medial right lower lobe (image 108), unchanged from 2012 CT abdomen/pelvis, benign. No follow up is recommended. No focal consolidation or pulmonary edema. No evidence of pleural effusion or pneumothorax. UPPER ABDOMEN: Status post cholecystectomy. SOFT TISSUES AND BONES: Degenerative changes of the visualized thoracolumbar spine. No acute bone or soft tissue abnormality. IMPRESSION: 1. No evidence of pulmonary embolism. 2. Negative CT chest. Electronically signed by: Pinkie Pebbles MD 06/22/2024 02:49 AM EDT RP Workstation: [id number]   DG Chest 2 View Result Date: 06/21/2024 CLINICAL DATA:  Chest pain EXAM: CHEST - 2 VIEW COMPARISON:  08/28/2023 FINDINGS: No acute airspace disease or effusion. Stable mild cardiomegaly with aortic atherosclerosis. No pneumothorax IMPRESSION: No active cardiopulmonary disease. Mild cardiomegaly. Electronically Signed   By: Luke Bun M.D.   On: 06/21/2024 18:56     Procedures   Medications Ordered in the ED  iohexol  (OMNIPAQUE ) 350 MG/ML injection 75 mL (75 mLs Intravenous Contrast Given 06/22/24 0236)                                    Medical Decision Making Amount and/or Complexity of Data Reviewed Radiology: ordered.  Risk Prescription drug management.   Pt with hx/o persistent atrial fibrillation on anticoagulation here for evaluation of acute on chronic chest pain, did have a recent arthroplasty.  EKG is similar when compared to priors and troponins are negative x 2.  She is overall low risk for PE, but given her persistent symptoms, particularly shortness of breath with recent surgery will obtain a CTA.  CTA is negative for PE or infiltrate.  Discussed with patient reassurance given her normal findings.  Current clinical picture is not consistent with ACS, hypertensive urgency, dissection.  Feel she is stable for discharge home with  outpatient follow-up and return precautions.     Final diagnoses:  Atypical chest pain    ED Discharge Orders     None          Griselda Norris, MD 06/22/24 [phone]

## 2024-06-22 NOTE — ED Notes (Signed)
 Patient transported to CT

## 2024-06-24 ENCOUNTER — Ambulatory Visit: Admitting: Cardiology

## 2024-06-29 DIAGNOSIS — K219 Gastro-esophageal reflux disease without esophagitis: Secondary | ICD-10-CM | POA: Diagnosis not present

## 2024-06-29 DIAGNOSIS — Z961 Presence of intraocular lens: Secondary | ICD-10-CM | POA: Diagnosis not present

## 2024-06-29 DIAGNOSIS — E039 Hypothyroidism, unspecified: Secondary | ICD-10-CM | POA: Diagnosis not present

## 2024-06-29 DIAGNOSIS — I10 Essential (primary) hypertension: Secondary | ICD-10-CM | POA: Diagnosis not present

## 2024-06-29 DIAGNOSIS — H04129 Dry eye syndrome of unspecified lacrimal gland: Secondary | ICD-10-CM | POA: Diagnosis not present

## 2024-06-29 DIAGNOSIS — F419 Anxiety disorder, unspecified: Secondary | ICD-10-CM | POA: Diagnosis not present

## 2024-06-29 DIAGNOSIS — R7303 Prediabetes: Secondary | ICD-10-CM | POA: Diagnosis not present

## 2024-06-29 DIAGNOSIS — H02403 Unspecified ptosis of bilateral eyelids: Secondary | ICD-10-CM | POA: Diagnosis not present

## 2024-06-29 DIAGNOSIS — E785 Hyperlipidemia, unspecified: Secondary | ICD-10-CM | POA: Diagnosis not present

## 2024-06-29 DIAGNOSIS — I4891 Unspecified atrial fibrillation: Secondary | ICD-10-CM | POA: Diagnosis not present

## 2024-06-29 DIAGNOSIS — Z0000 Encounter for general adult medical examination without abnormal findings: Secondary | ICD-10-CM | POA: Diagnosis not present

## 2024-07-06 DIAGNOSIS — G4733 Obstructive sleep apnea (adult) (pediatric): Secondary | ICD-10-CM | POA: Diagnosis not present

## 2024-07-14 ENCOUNTER — Ambulatory Visit: Attending: Cardiology | Admitting: Cardiology

## 2024-07-14 ENCOUNTER — Telehealth: Payer: Self-pay

## 2024-07-14 ENCOUNTER — Encounter: Payer: Self-pay | Admitting: Cardiology

## 2024-07-14 VITALS — BP 154/94 | HR 88 | Ht 62.0 in | Wt 155.0 lb

## 2024-07-14 DIAGNOSIS — I4819 Other persistent atrial fibrillation: Secondary | ICD-10-CM | POA: Diagnosis not present

## 2024-07-14 NOTE — Progress Notes (Signed)
  Electrophysiology Office Follow up Visit Note:    Date:  07/14/2024   ID:  Tabitha Murphy, DOB 1941-11-15, MRN [id number]  PCP:  Rolinda Millman, MD  [facility] Cardiologist:  Wilbert Bihari, MD  [facility] Electrophysiologist:  OLE ONEIDA HOLTS, MD    Interval History:     Tabitha Murphy is a 83 y.o. female who presents for a follow up visit.   I saw the patient Apr 04, 2024.  She has a history of persistent atrial fibrillation and hypertension.  When I last saw her we discussed treatment strategies for her atrial fibrillation but planned to delay this until she completed her hip surgery.  Her hip replacement was completed Apr 20, 2024.  She went to the emergency department June 21, 2024 with chest pain.  She reported that her pain had been present since she was 83 years old.  She is on Eliquis .  In the emergency department her troponins were negative twice.  CTA was performed which was negative for any acute findings.  She was discharged from the emergency department.  She is doing okay today.  She did go to the ER recently with chest discomfort.  This was in the setting of vomiting earlier that day.  Her workup was negative.      Past medical, surgical, social and family history were reviewed.  ROS:   Please see the history of present illness.    All other systems reviewed and are negative.  EKGs/Labs/Other Studies Reviewed:    The following studies were reviewed today:  June 21, 2024 EKG shows atrial fibrillation.  Ventricular rate 84 bpm.  Apr 04, 2024 EKG shows atrial fibrillation with a ventricular rate of 68 bpm        Physical Exam:    VS:  There were no vitals taken for this visit.    Wt Readings from Last 3 Encounters:  06/21/24 160 lb (72.6 kg)  04/20/24 154 lb 5.2 oz (70 kg)  04/13/24 154 lb 5.2 oz (70 kg)     GEN: no distress.  Elderly CARD: Irregularly irregular, No MRG RESP: No IWOB. CTAB.      ASSESSMENT:    No diagnosis found. PLAN:     In order of problems listed above:  #Persistent atrial fibrillation On Eliquis  for stroke prophylaxis  #Atypical chest pain Noncardiac.  Been present since she was a teenager.  Likely related to vomiting earlier that day.  #Preop restratification I think she is at acceptable risk to undergo planned orthopedic surgery.  Okay to hold Eliquis  for 3 days prior prior to the procedure and restart when felt safe from a surgical perspective.  Follow-up APP 6 months   Signed, OLE HOLTS, MD, [facility], [facility] 07/14/2024 7:58 AM    Electrophysiology [facility]

## 2024-07-14 NOTE — Patient Instructions (Signed)
 Medication Instructions:  Your physician recommends that you continue on your current medications as directed. Please refer to the Current Medication list given to you today.  *If you need a refill on your cardiac medications before your next appointment, please call your pharmacy*   Follow-Up: At [facility], you and your health needs are our priority.  As part of our continuing mission to provide you with exceptional heart care, our providers are all part of one team.  This team includes your primary Cardiologist (physician) and Advanced Practice Providers or APPs (Physician Assistants and Nurse Practitioners) who all work together to provide you with the care you need, when you need it.  Your next appointment:   6 month(s)  Provider:   You will see one of the following Advanced Practice Providers on your designated Care Team:   Charlies Arthur, PA-C Michael Andy Tillery, PA-C Suzann Riddle, NP Daphne Barrack, NP

## 2024-07-14 NOTE — Telephone Encounter (Signed)
   Pre-operative Risk Assessment    Patient Name: Tabitha Murphy  DOB: 01/08/1941 MRN: [id number]   Date of last office visit: 07/14/24 SHERLEAN HOLTS, MD Date of next office visit: NONE   Request for Surgical Clearance    Procedure:  RT TOTAL HIP ARTHROPLASTY  Date of Surgery:  Clearance 08/29/24                                 Surgeon:  DR DEMPSEY MOAN Surgeon's Group or Practice Name:  [facility] Phone number:  [phone] Fax number:  [phone]  ATTN: KERRI MAZE   Type of Clearance Requested:   - Medical  - Pharmacy:  Hold Apixaban  (Eliquis ) 3 DAYS FOR SPINAL   Type of Anesthesia:  CHOICE   Additional requests/questions:    Signed, Lucie DELENA Ku   07/14/2024, 5:05 PM

## 2024-07-15 NOTE — Telephone Encounter (Signed)
   Patient Name: Tabitha Murphy  DOB: 10/06/1941 MRN: [id number]  Primary Cardiologist: Wilbert Bihari, MD  Chart reviewed as part of pre-operative protocol coverage. Given past medical history and time since last visit, based on ACC/AHA guidelines, [name] is at acceptable risk for the planned procedure without further cardiovascular testing.   #Preop restratification: Dr. Cindie 07/14/2024 I think she is at acceptable risk to undergo planned orthopedic surgery.  Okay to hold Eliquis  for 3 days prior prior to the procedure and restart when felt safe from a surgical perspective.  The patient was advised that if she develops new symptoms prior to surgery to contact our office to arrange for a follow-up visit, and she verbalized understanding.  I will route this recommendation to the requesting party via Epic fax function and remove from pre-op pool.  Please call with questions.  Lamarr Satterfield, NP 07/15/2024, 10:20 AM

## 2024-07-25 DIAGNOSIS — Z1231 Encounter for screening mammogram for malignant neoplasm of breast: Secondary | ICD-10-CM | POA: Diagnosis not present

## 2024-07-27 ENCOUNTER — Telehealth: Payer: Self-pay | Admitting: Cardiology

## 2024-07-27 DIAGNOSIS — I4819 Other persistent atrial fibrillation: Secondary | ICD-10-CM

## 2024-07-27 NOTE — Telephone Encounter (Signed)
 Taking 50 mg atenolol  twice daily, 2nd dose usually 8:30 - 9:00 pm.  She wears a watch that keeps up w HR.  Also checks BP and that shows HR as well.  She said she feels completely fine and only is aware the rate is low because of the monitoring. Last night:132/64 147/71 148/77 With these pressures HR is mid 40s-high 50s.  During sleeping it is lower 40s.    Currently HR is 58 and she feels just fine.  She stated she's been on atenolol  for years and her HR was not ever this low.  She is unsure how long its been low now.  She remembers earlier this year that she was to have a procedure and it had to be cancelled for low HR.  A medication was stopped.  She doesn't know which one.    Having hip replacement on 08/29/24 and she worried the low HR will interfere w that.  Adv I will send the message to Dr. Shlomo and Dr. Cindie for recommendations and then someone will give her a call back.

## 2024-07-27 NOTE — Telephone Encounter (Signed)
 STAT if HR is under 50 or over 120  (normal HR is 60-100 beats per minute)  What is your heart rate? 48  Do you have a log of your heart rate readings (document readings)? [phone]  Do you have any other symptoms? No

## 2024-07-28 ENCOUNTER — Ambulatory Visit: Attending: Cardiology

## 2024-07-28 DIAGNOSIS — I4819 Other persistent atrial fibrillation: Secondary | ICD-10-CM

## 2024-07-28 NOTE — Telephone Encounter (Signed)
 Spoke with the patient and advised on recommendations from Dr. Shlomo. Zio has bee ordered.

## 2024-07-28 NOTE — Progress Notes (Unsigned)
 Enrolled for [employer] to mail a ZIO XT long term holter monitor to the patients address on file.

## 2024-08-05 DIAGNOSIS — R921 Mammographic calcification found on diagnostic imaging of breast: Secondary | ICD-10-CM | POA: Diagnosis not present

## 2024-08-05 DIAGNOSIS — R42 Dizziness and giddiness: Secondary | ICD-10-CM | POA: Diagnosis not present

## 2024-08-05 DIAGNOSIS — G4700 Insomnia, unspecified: Secondary | ICD-10-CM | POA: Diagnosis not present

## 2024-08-05 DIAGNOSIS — N6315 Unspecified lump in the right breast, overlapping quadrants: Secondary | ICD-10-CM | POA: Diagnosis not present

## 2024-08-08 ENCOUNTER — Other Ambulatory Visit: Payer: Self-pay | Admitting: Radiology

## 2024-08-08 ENCOUNTER — Other Ambulatory Visit: Payer: Self-pay | Admitting: Family Medicine

## 2024-08-08 DIAGNOSIS — N6315 Unspecified lump in the right breast, overlapping quadrants: Secondary | ICD-10-CM | POA: Diagnosis not present

## 2024-08-08 DIAGNOSIS — C50911 Malignant neoplasm of unspecified site of right female breast: Secondary | ICD-10-CM | POA: Diagnosis not present

## 2024-08-08 DIAGNOSIS — Z170 Estrogen receptor positive status [ER+]: Secondary | ICD-10-CM | POA: Diagnosis not present

## 2024-08-08 DIAGNOSIS — R42 Dizziness and giddiness: Secondary | ICD-10-CM

## 2024-08-09 DIAGNOSIS — I4819 Other persistent atrial fibrillation: Secondary | ICD-10-CM | POA: Diagnosis not present

## 2024-08-09 LAB — SURGICAL PATHOLOGY

## 2024-08-10 ENCOUNTER — Telehealth: Payer: Self-pay | Admitting: *Deleted

## 2024-08-10 ENCOUNTER — Ambulatory Visit: Payer: Self-pay | Admitting: Cardiology

## 2024-08-10 DIAGNOSIS — I4819 Other persistent atrial fibrillation: Secondary | ICD-10-CM

## 2024-08-10 NOTE — Telephone Encounter (Signed)
-----   Message from Wilbert Bihari sent at 08/10/2024  2:09 PM EDT ----- Patient now on chronic A-fib with good heart rate control.  Continue current medical therapy.  Please find out how she is feeling ----- Message ----- From: Bihari Wilbert SAUNDERS, MD Sent: 08/10/2024   2:08 PM EDT To: Wilbert SAUNDERS Bihari, MD

## 2024-08-10 NOTE — Telephone Encounter (Signed)
 Call to patient to advise heart monitor showed she is now on chronic A-fib with good heart rate control.  Patient verbalizes understanding to continue current medical therapy.  Patient states she has had no further episodes of cardiac symptoms.

## 2024-08-10 NOTE — Telephone Encounter (Signed)
 Spoke to patient to confirm upcoming afternoon [facility] clinic appointment on 9/17, paperwork will be sent via [facility] location and time, also informed patient that the surgeon's office would be calling as well to get information from them similar to the packet that they will be receiving so make sure to do both.  Reminded patient that all providers will be coming to the clinic to see them HERE and if they had any questions to not hesitate to reach back out to myself or their navigators.

## 2024-08-12 ENCOUNTER — Encounter: Payer: Self-pay | Admitting: *Deleted

## 2024-08-15 ENCOUNTER — Encounter: Payer: Self-pay | Admitting: *Deleted

## 2024-08-15 DIAGNOSIS — Z170 Estrogen receptor positive status [ER+]: Secondary | ICD-10-CM | POA: Insufficient documentation

## 2024-08-15 DIAGNOSIS — Z23 Encounter for immunization: Secondary | ICD-10-CM | POA: Diagnosis not present

## 2024-08-16 ENCOUNTER — Other Ambulatory Visit

## 2024-08-16 NOTE — Progress Notes (Signed)
 Radiation Oncology         [phone] ________________________________  Initial Outpatient Consultation  Name: Tabitha Murphy MRN: [id number]  Date: 08/17/2024  DOB: July 15, 1941  RR:Yjhozm, Vernell, MD  Ebbie Cough, MD   REFERRING PHYSICIAN: Ebbie Cough, MD  DIAGNOSIS: No diagnosis found.   Cancer Staging  No matching staging information was found for the patient.  Stage *** Right Breast UIQ, Invasive ductal carcinoma w/ focal intermediate grade DCIS, ER+ / PR+ / Her2-, Grade 2  CHIEF COMPLAINT: Here to discuss management of right breast cancer  HISTORY OF PRESENT ILLNESS::Tabitha Murphy is a 83 y.o. female who presented with breast abnormality on the following imaging: *** on the date of ***.  Symptoms, if any, at that time, were: ***.   Ultrasound of breast on *** revealed ***.     Biopsy of the 2 o'clock right breast (8 cmfn) on date of 08/08/24 showed: grade 2 invasive ductal carcinoma measuring 8 mm in the greatest linear extent of the sample with focal intermediate grade DCIS, and calcifications present in both the invasive and in situ components of the sample.  ER status: 100% positive and PR status 90% positive, both with strong staining intensity; Proliferation marker Ki67 at 10%; Her2 status negative; Grade 2. No lymph nodes were examined.   ***  PREVIOUS RADIATION THERAPY: No  PAST MEDICAL HISTORY:  has a past medical history of Anxiety, Arthritis, Benign essential HTN, CAD (coronary artery disease), native coronary artery, Complication of anesthesia, DCM (dilated cardiomyopathy) (HCC), Diverticulosis, Dyspnea, Heart murmur, Hiatal hernia, HLD (hyperlipidemia), Persistent atrial fibrillation (HCC), PONV (postoperative nausea and vomiting), Pre-diabetes, S/P laparoscopic cholecystectomy (07/28/2011), Sleep apnea, and Thyroid  goiter.    PAST SURGICAL HISTORY: Past Surgical History:  Procedure Laterality Date   BALLOON DILATION N/A 03/24/2023   Procedure:  BALLOON DILATION;  Surgeon: Saintclair Jasper, MD;  Location: [facility];  Service: Gastroenterology;  Laterality: N/A;   CARDIOVERSION N/A 02/29/2024   Procedure: CARDIOVERSION;  Surgeon: Barbaraann Darryle Ned, MD;  Location: [facility] INVASIVE CV LAB;  Service: Cardiovascular;  Laterality: N/A;   child birth     x3   CHOLECYSTECTOMY     ESOPHAGEAL MANOMETRY N/A 07/22/2023   Procedure: ESOPHAGEAL MANOMETRY (EM);  Surgeon: Dianna Specking, MD;  Location: [facility];  Service: Gastroenterology;  Laterality: N/A;   ESOPHAGOGASTRODUODENOSCOPY (EGD) WITH PROPOFOL  N/A 03/24/2023   Procedure: ESOPHAGOGASTRODUODENOSCOPY (EGD) WITH PROPOFOL ;  Surgeon: Saintclair Jasper, MD;  Location: [facility];  Service: Gastroenterology;  Laterality: N/A;   TOTAL HIP ARTHROPLASTY Left 04/20/2024   Procedure: ARTHROPLASTY, HIP, TOTAL, ANTERIOR APPROACH;  Surgeon: Melodi Lerner, MD;  Location: [facility];  Service: Orthopedics;  Laterality: Left;    FAMILY HISTORY: family history includes CAD in her sister; CVA in her maternal grandfather and maternal grandmother; Diabetes in her paternal grandmother, sister, and sister; Heart attack in her brother, brother, brother, father, and mother; Heart attack (age of onset: 75) in her brother; Heart attack (age of onset: 51) in her sister; Heart disease in her brother; Hypertension in her sister, sister, sister, and sister; Ovarian cancer (age of onset: 53) in her mother.  SOCIAL HISTORY:  reports that she has never smoked. She has never used smokeless tobacco. She reports that she does not drink alcohol and does not use drugs.  ALLERGIES: Doxazosin mesylate, Meclizine, Oxycodone hcl, Zolpidem, Amlodipine besylate, Codeine, Crest tartar control, Cymbalta  [duloxetine  hcl], Duloxetine , Gabapentin, Lyrica  [pregabalin ], Rosuvastatin  calcium , and Zolpidem tartrate  MEDICATIONS:  Current Outpatient Medications  Medication Sig Dispense  Refill   albuterol  (VENTOLIN  HFA) 108 (90 Base) MCG/ACT inhaler  Inhale 2 puffs into the lungs every 4 (four) hours as needed for wheezing or shortness of breath.     ALPRAZolam  (XANAX ) 0.5 MG tablet Take 0.5 mg by mouth daily as needed for anxiety or sleep.     Ascorbic Acid (VITAMIN C PO) Take 1 tablet by mouth daily.     atenolol  (TENORMIN ) 100 MG tablet TAKE 1/2 TABLET BY MOUTH TWICE A DAY 90 tablet 0   B Complex-C (SUPER B COMPLEX PO) Take 1 tablet by mouth daily.     Biotin 89999 MCG TBDP Take 10,000 mcg by mouth daily.     CALCIUM -VITAMIN D PO Take 1 tablet by mouth daily.     ELIQUIS  5 MG TABS tablet TAKE 1 TABLET BY MOUTH TWICE A DAY 180 tablet 1   furosemide  (LASIX ) 20 MG tablet Take one tablet (20 mg) as needed for swelling but call the office if taking more than twice a week. 90 tablet 3   hydrochlorothiazide  (MICROZIDE ) 12.5 MG capsule TAKE 1 CAPSULE BY MOUTH EVERY DAY (Patient taking differently: Take 12.5 mg by mouth 2 (two) times daily.) 90 capsule 3   irbesartan  (AVAPRO ) 300 MG tablet Take 300 mg by mouth every morning.     levothyroxine  (SYNTHROID ) 25 MCG tablet Take 25 mcg by mouth daily before breakfast.     lidocaine -prilocaine  (EMLA ) cream Apply 1 Application topically 2 (two) times daily as needed (feet burning/pain.).     meclizine (ANTIVERT) 25 MG tablet Take 25 mg by mouth every 12 (twelve) hours as needed for dizziness.     METAMUCIL FIBER PO Take 3 each by mouth daily. Chewables     Multiple Vitamin (MULTIVITAMIN) tablet Take 1 tablet by mouth daily.  Centrum     omeprazole  (PRILOSEC  OTC) 20 MG tablet Take 10 mg by mouth daily before breakfast.     ondansetron  (ZOFRAN ) 4 MG tablet Take 4 mg by mouth daily as needed for nausea or vomiting.     Polyethyl Glycol-Propyl Glycol (SYSTANE) 0.4-0.3 % SOLN Place 1 drop into both eyes 3 (three) times daily as needed (dry/irritated eyes.).     promethazine  (PHENERGAN ) 25 MG tablet Take 25 mg by mouth every 6 (six) hours as needed for nausea or vomiting.     rosuvastatin  (CRESTOR ) 5 MG tablet  TAKE 1 TABLET (5 MG TOTAL) BY MOUTH DAILY. (Patient taking differently: Take 5 mg by mouth at bedtime.) 90 tablet 3   scopolamine (TRANSDERM-SCOP) 1 MG/3DAYS Place 1 patch onto the skin every 3 (three) days as needed (nausea).     Selenium 100 MCG TABS Take 100 mcg by mouth daily.     traZODone (DESYREL) 50 MG tablet Take 50 mg by mouth at bedtime as needed for sleep.     valACYclovir (VALTREX) 1000 MG tablet Take 2,000 mg by mouth 2 (two) times daily as needed (fever blisters).     No current facility-administered medications for this encounter.    REVIEW OF SYSTEMS: As above in HPI.   PHYSICAL EXAM:  vitals were not taken for this visit.   General: Alert and oriented, in no acute distress HEENT: Head is normocephalic. Extraocular movements are intact.  Heart: Regular in rate and rhythm with no murmurs, rubs, or gallops. Chest: Clear to auscultation bilaterally, with no rhonchi, wheezes, or rales. Abdomen: Soft, nontender, nondistended, with no rigidity or guarding. Extremities: No cyanosis or edema. Skin: No concerning lesions. Musculoskeletal: symmetric strength and  muscle tone throughout. Neurologic: Cranial nerves II through XII are grossly intact. No obvious focalities. Speech is fluent. Coordination is intact. Psychiatric: Judgment and insight are intact. Affect is appropriate. Breasts: *** . No other palpable masses appreciated in the breasts or axillae *** .    ECOG = ***  0 - Asymptomatic (Fully active, able to carry on all predisease activities without restriction)  1 - Symptomatic but completely ambulatory (Restricted in physically strenuous activity but ambulatory and able to carry out work of a light or sedentary nature. For example, light housework, office work)  2 - Symptomatic, <50% in bed during the day (Ambulatory and capable of all self care but unable to carry out any work activities. Up and about more than 50% of waking hours)  3 - Symptomatic, >50% in bed, but  not bedbound (Capable of only limited self-care, confined to bed or chair 50% or more of waking hours)  4 - Bedbound (Completely disabled. Cannot carry on any self-care. Totally confined to bed or chair)  5 - Death   Raylene MM, Creech RH, Tormey DC, et al. [id number]). Toxicity and response criteria of the [facility]. Am. DOROTHA Bridges. Oncol. 5 (6): 649-55   LABORATORY DATA:   CBC    Component Value Date/Time   WBC 8.7 06/21/2024 1825   RBC 3.92 06/21/2024 1825   HGB 11.7 (L) 06/21/2024 1825   HGB 12.7 02/22/2024 1023   HCT 35.5 (L) 06/21/2024 1825   HCT 37.5 02/22/2024 1023   PLT 354 06/21/2024 1825   PLT 385 02/22/2024 1023   MCV 90.6 06/21/2024 1825   MCV 93 02/22/2024 1023   MCH 29.8 06/21/2024 1825   MCHC 33.0 06/21/2024 1825   RDW 13.4 06/21/2024 1825   RDW 13.3 02/22/2024 1023   LYMPHSABS 2.5 07/23/2022 1127   MONOABS 0.8 03/11/2014 1535   EOSABS 0.2 07/23/2022 1127   BASOSABS 0.1 07/23/2022 1127    CMP     Component Value Date/Time   NA 134 (L) 06/21/2024 1825   NA 132 (L) 02/22/2024 1023   K 3.8 06/21/2024 1825   CL 93 (L) 06/21/2024 1825   CO2 28 06/21/2024 1825   GLUCOSE 108 (H) 06/21/2024 1825   BUN 9 06/21/2024 1825   BUN 13 02/22/2024 1023   CREATININE 0.78 06/21/2024 1825   CALCIUM  9.4 06/21/2024 1825   PROT 7.7 08/28/2023 1639   PROT 7.0 03/27/2021 1015   ALBUMIN 4.5 08/28/2023 1639   ALBUMIN 4.3 03/27/2021 1015   AST 17 08/28/2023 1639   ALT 9 08/28/2023 1639   ALKPHOS 97 08/28/2023 1639   BILITOT 0.6 08/28/2023 1639   BILITOT 0.4 03/27/2021 1015   EGFR 76 02/22/2024 1023   GFRNONAA >60 06/21/2024 1825      RADIOGRAPHY: LONG TERM MONITOR (3-14 DAYS) Result Date: 08/10/2024   Predominant rhythm was atrial fibrillation with 100% A-fib burden.  Average heart rate 68 bpm and ranged from 41 to 117 bpm   Isolated PVCs, ventricular couplets with PVC load less than 1% Patch Wear Time:  4 days and 11 hours (2025-08-31T08:20:16-0400  to 2025-09-04T19:53:53-0400) Atrial Fibrillation occurred continuously (100% burden), ranging from 41-117 bpm (avg of 68 bpm). Isolated VEs were rare (<1.0%), VE Couplets were rare (<1.0%), and no VE Triplets were present.      IMPRESSION/PLAN: ***   It was a pleasure meeting the patient today. We discussed the risks, benefits, and side effects of radiotherapy. I recommend radiotherapy to the *** to reduce  her risk of locoregional recurrence by 2/3.  We discussed that radiation would take approximately *** weeks to complete and that I would give the patient a few weeks to heal following surgery before starting treatment planning. *** If chemotherapy were to be given, this would precede radiotherapy. We spoke about acute effects including skin irritation and fatigue as well as much less common late effects including internal organ injury or irritation. We spoke about the latest technology that is used to minimize the risk of late effects for patients undergoing radiotherapy to the breast or chest wall. No guarantees of treatment were given. The patient is enthusiastic about proceeding with treatment. I look forward to participating in the patient's care.  I will await her referral back to me for postoperative follow-up and eventual CT simulation/treatment planning.  On date of service, in total, I spent *** minutes on this encounter. Patient was seen in person.   __________________________________________   Lauraine Golden, MD  This document serves as a record of services personally performed by Lauraine Golden, MD. It was created on her behalf by Dorthy Fuse, a trained medical scribe. The creation of this record is based on the scribe's personal observations and the provider's statements to them. This document has been checked and approved by the attending provider.

## 2024-08-17 ENCOUNTER — Encounter: Payer: Self-pay | Admitting: Radiation Oncology

## 2024-08-17 ENCOUNTER — Inpatient Hospital Stay (HOSPITAL_BASED_OUTPATIENT_CLINIC_OR_DEPARTMENT_OTHER): Admitting: Hematology and Oncology

## 2024-08-17 ENCOUNTER — Ambulatory Visit
Admission: RE | Admit: 2024-08-17 | Discharge: 2024-08-17 | Disposition: A | Discharge status: Home / Self Care | Source: Ambulatory Visit | Attending: Radiation Oncology | Admitting: Radiation Oncology

## 2024-08-17 ENCOUNTER — Other Ambulatory Visit: Payer: Self-pay | Admitting: General Surgery

## 2024-08-17 ENCOUNTER — Encounter: Payer: Self-pay | Admitting: General Practice

## 2024-08-17 ENCOUNTER — Ambulatory Visit: Admitting: Physical Therapy

## 2024-08-17 ENCOUNTER — Inpatient Hospital Stay: Attending: Hematology and Oncology

## 2024-08-17 ENCOUNTER — Encounter: Payer: Self-pay | Admitting: Genetic Counselor

## 2024-08-17 ENCOUNTER — Encounter: Payer: Self-pay | Admitting: *Deleted

## 2024-08-17 ENCOUNTER — Inpatient Hospital Stay: Admitting: Genetic Counselor

## 2024-08-17 VITALS — BP 118/56 | HR 53 | Temp 97.9°F | Resp 18 | Ht 62.0 in | Wt 160.3 lb

## 2024-08-17 DIAGNOSIS — Z800 Family history of malignant neoplasm of digestive organs: Secondary | ICD-10-CM | POA: Insufficient documentation

## 2024-08-17 DIAGNOSIS — C50211 Malignant neoplasm of upper-inner quadrant of right female breast: Secondary | ICD-10-CM

## 2024-08-17 DIAGNOSIS — Z170 Estrogen receptor positive status [ER+]: Secondary | ICD-10-CM | POA: Insufficient documentation

## 2024-08-17 DIAGNOSIS — Z8049 Family history of malignant neoplasm of other genital organs: Secondary | ICD-10-CM | POA: Insufficient documentation

## 2024-08-17 LAB — CBC WITH DIFFERENTIAL (CANCER CENTER ONLY)
Abs Immature Granulocytes: 0.02 K/uL (ref 0.00–0.07)
Basophils Absolute: 0 K/uL (ref 0.0–0.1)
Basophils Relative: 0 %
Eosinophils Absolute: 0.1 K/uL (ref 0.0–0.5)
Eosinophils Relative: 1 %
HCT: 31.2 % — ABNORMAL LOW (ref 36.0–46.0)
Hemoglobin: 11 g/dL — ABNORMAL LOW (ref 12.0–15.0)
Immature Granulocytes: 0 %
Lymphocytes Relative: 15 %
Lymphs Abs: 1.3 K/uL (ref 0.7–4.0)
MCH: 30.1 pg (ref 26.0–34.0)
MCHC: 35.3 g/dL (ref 30.0–36.0)
MCV: 85.2 fL (ref 80.0–100.0)
Monocytes Absolute: 0.8 K/uL (ref 0.1–1.0)
Monocytes Relative: 10 %
Neutro Abs: 6 K/uL (ref 1.7–7.7)
Neutrophils Relative %: 74 %
Platelet Count: 314 K/uL (ref 150–400)
RBC: 3.66 MIL/uL — ABNORMAL LOW (ref 3.87–5.11)
RDW: 13.3 % (ref 11.5–15.5)
WBC Count: 8.2 K/uL (ref 4.0–10.5)
nRBC: 0 % (ref 0.0–0.2)

## 2024-08-17 LAB — CMP (CANCER CENTER ONLY)
ALT: 10 U/L (ref 0–44)
AST: 15 U/L (ref 15–41)
Albumin: 4.2 g/dL (ref 3.5–5.0)
Alkaline Phosphatase: 99 U/L (ref 38–126)
Anion gap: 8 (ref 5–15)
BUN: 13 mg/dL (ref 8–23)
CO2: 31 mmol/L (ref 22–32)
Calcium: 9.3 mg/dL (ref 8.9–10.3)
Chloride: 83 mmol/L — ABNORMAL LOW (ref 98–111)
Creatinine: 0.92 mg/dL (ref 0.44–1.00)
GFR, Estimated: >60 mL/min (ref >60)
Glucose, Bld: 127 mg/dL — ABNORMAL HIGH (ref 70–99)
Potassium: 4.2 mmol/L (ref 3.5–5.1)
Sodium: 122 mmol/L — ABNORMAL LOW (ref 135–145)
Total Bilirubin: 0.5 mg/dL (ref 0.0–1.2)
Total Protein: 7.3 g/dL (ref 6.5–8.1)

## 2024-08-17 LAB — GENETIC SCREENING ORDER

## 2024-08-17 NOTE — Research (Signed)
 [employer] 2021-05 - Specimen Collection Study to Evaluate Biomarkers in Subjects with Cancer    Patient [name] was identified by Dr. Gudena as a potential candidate for the above listed study.  This Clinical Research Coordinator met with [name], [id number], on 08/17/24 in a manner and location that ensures patient privacy to discuss participation in the above listed research study.  Patient is Accompanied by daughter.  A copy of the informed consent document with embedded HIPAA language was provided to the patient.  Patient reads, speaks, and understands [place].   Patient was provided with the business card of this Coordinator and encouraged to contact the research team with any questions.  Approximately 10 minutes were spent with the patient reviewing the informed consent documents.  Patient was provided the option of taking informed consent documents home to review and was encouraged to review at their convenience with their support network, including other care providers. Patient is comfortable with making a decision regarding study participation today.  Patient declined study participation. Patient mentioned does not do well with giving blood. Dr. Odean notified.   Laury Quale, MPH  Clinical Research Coordinator

## 2024-08-17 NOTE — Progress Notes (Signed)
 [facility] Spiritual Care Note  Met with Tabitha Murphy and her daughter Tabitha Murphy, who was in from [facility], in Breast Multidisciplinary Clinic to introduce Support Center team/resources.  She completed SDOH screening; results follow below.    SDOH Screenings   Food Insecurity: No Food Insecurity (08/17/2024)  Housing: Low Risk  (08/17/2024)  Transportation Needs: No Transportation Needs (08/17/2024)  Utilities: Not At Risk (08/17/2024)  Depression (PHQ2-9): Low Risk  (08/17/2024)  Social Connections: Moderately Integrated (04/20/2024)  Tobacco Use: Low Risk  (08/17/2024)    Chaplain and patient discussed common feelings and emotions when being diagnosed with cancer, and the importance of support during treatment.  Chaplain informed patient of the support team and support services at [facility].  Chaplain provided contact information and encouraged patient to call with any questions or concerns.  Follow up needed: Yes.  [name] is feeling apprehensive about her diagnosis and eager to get past surgery. She welcomes a follow-up call in ca 2 weeks for further emotional support. We plan to revisit her interest in an Sports coach at that time.   [address] [name], [place], [facility] Pager [phone] Voicemail [phone]

## 2024-08-17 NOTE — Progress Notes (Signed)
 [facility] CONSULT NOTE  Patient Care Team: Rolinda Millman, MD as PCP - General (Family Medicine) Shlomo Wilbert SAUNDERS, MD as PCP - Cardiology (Cardiology) Cindie Ole DASEN, MD as PCP - Electrophysiology (Cardiology) Gerome, Devere HERO, RN as Oncology Nurse Navigator Tyree Nanetta SAILOR, RN as Registered Nurse Ebbie Cough, MD as Consulting Physician (General Surgery) Odean Potts, MD as Consulting Physician (Hematology and Oncology) Izell Domino, MD as Attending Physician (Radiation Oncology)  CHIEF COMPLAINTS/PURPOSE OF CONSULTATION:  Newly diagnosed breast cancer  HISTORY OF PRESENTING ILLNESS:    History of Present Illness Tabitha Murphy is an 83 year old female who presents for oncology consultation regarding invasive ductal cancer.  She has invasive ductal cancer confirmed by biopsy, originating in the breast ducts and invading surrounding tissue. The tumor measures 0.8 centimeters on ultrasound with no lymph node involvement. The cancer is estrogen and progesterone receptor-positive, and HER2 receptor-negative. The KI67 proliferation index is 10%.  We discussed her case in the multidisciplinary tumor board and she is here today to discuss her treatment plan accompanied by her family.   I reviewed her records extensively and collaborated the history with the patient.  SUMMARY OF ONCOLOGIC HISTORY: Oncology History  Malignant neoplasm of upper-inner quadrant of right breast in female, estrogen receptor positive (HCC)  08/08/2024 Initial Diagnosis   Screening mammogram detected right breast asymmetry measuring 1 cm spiculated at 1 o'clock position.  By ultrasound it measured 0.8 cm, axilla negative, ultrasound biopsy: Grade 2 IDC, no LVI, ER 100%, PR 90%, Ki67 10%, HER2 negative by FISH      MEDICAL HISTORY:  Past Medical History:  Diagnosis Date   Anxiety    Arthritis    Benign essential HTN    CAD (coronary artery disease), native coronary artery    Cath  2012 with 30% LCx; Coronary CTA showed minimal mixed nonobstructive disease.  Less than 25% ostial LAD and left circumflex with coronary calcium  score 41.7 on  12/2022   Complication of anesthesia    slow to wake up   DCM (dilated cardiomyopathy) (HCC)    ? tachy mediated from afib with RVR.  EF 45-50% by echo 01/2021.  Lexiscan  myoview  with no ischemia   Diverticulosis    Dyspnea    Heart murmur    Hiatal hernia    HLD (hyperlipidemia)    Persistent atrial fibrillation (HCC)    On DOAC   PONV (postoperative nausea and vomiting)    Pre-diabetes    S/P laparoscopic cholecystectomy 07/28/2011   23 years ago   Sleep apnea    cpap   Thyroid  goiter     SURGICAL HISTORY: Past Surgical History:  Procedure Laterality Date   BALLOON DILATION N/A 03/24/2023   Procedure: BALLOON DILATION;  Surgeon: Saintclair Jasper, MD;  Location: [facility];  Service: Gastroenterology;  Laterality: N/A;   CARDIOVERSION N/A 02/29/2024   Procedure: CARDIOVERSION;  Surgeon: Barbaraann Darryle Ned, MD;  Location: [facility] INVASIVE CV LAB;  Service: Cardiovascular;  Laterality: N/A;   child birth     x3   CHOLECYSTECTOMY     ESOPHAGEAL MANOMETRY N/A 07/22/2023   Procedure: ESOPHAGEAL MANOMETRY (EM);  Surgeon: Dianna Specking, MD;  Location: [facility];  Service: Gastroenterology;  Laterality: N/A;   ESOPHAGOGASTRODUODENOSCOPY (EGD) WITH PROPOFOL  N/A 03/24/2023   Procedure: ESOPHAGOGASTRODUODENOSCOPY (EGD) WITH PROPOFOL ;  Surgeon: Saintclair Jasper, MD;  Location: [facility];  Service: Gastroenterology;  Laterality: N/A;   TOTAL HIP ARTHROPLASTY Left 04/20/2024   Procedure: ARTHROPLASTY, HIP, TOTAL, ANTERIOR APPROACH;  Surgeon: Melodi Lerner, MD;  Location: [facility];  Service: Orthopedics;  Laterality: Left;    SOCIAL HISTORY: Social History   Socioeconomic History   Marital status: Widowed    Spouse name: Not on file   Number of children: Not on file   Years of education: Not on file   Highest education level: Not on  file  Occupational History   Occupation: retired  Tobacco Use   Smoking status: Never   Smokeless tobacco: Never   Tobacco comments:    Never smoke 02/18/22  Vaping Use   Vaping status: Never Used  Substance and Sexual Activity   Alcohol use: No   Drug use: No    Comment: CBD gummies   Sexual activity: Not on file  Other Topics Concern   Not on file  Social History Narrative   Not on file   Social Drivers of Health   Financial Resource Strain: Not on file  Food Insecurity: No Food Insecurity (08/17/2024)   Hunger Vital Sign    Worried About Running Out of Food in the Last Year: Never true    Ran Out of Food in the Last Year: Never true  Transportation Needs: No Transportation Needs (08/17/2024)   PRAPARE - Administrator, Civil Service (Medical): No    Lack of Transportation (Non-Medical): No  Physical Activity: Not on file  Stress: Not on file  Social Connections: Moderately Integrated (04/20/2024)   Social Connection and Isolation Panel    Frequency of Communication with Friends and Family: More than three times a week    Frequency of Social Gatherings with Friends and Family: Three times a week    Attends Religious Services: More than 4 times per year    Active Member of Clubs or Organizations: No    Attends Banker Meetings: 1 to 4 times per year    Marital Status: Widowed  Intimate Partner Violence: Unknown (08/17/2024)   Humiliation, Afraid, Rape, and Kick questionnaire    Fear of Current or Ex-Partner: No    Emotionally Abused: No    Physically Abused: No    Sexually Abused: Not on file    FAMILY HISTORY: Family History  Problem Relation Age of Onset   Ovarian cancer Mother 47       or uterine   Heart attack Mother    Heart attack Father        Died in early 45s with MI   CAD Sister    Diabetes Sister    Hypertension Sister    Diabetes Sister    Hypertension Sister    Hypertension Sister    Heart attack Sister 39    Hypertension Sister    Heart disease Brother    Heart attack Brother    Heart attack Brother    Heart attack Brother    Heart attack Brother 9   CVA Maternal Grandmother    CVA Maternal Grandfather    Diabetes Paternal Grandmother    Colon cancer Neg Hx     ALLERGIES:  is allergic to doxazosin mesylate, meclizine, oxycodone hcl, zolpidem, amlodipine besylate, codeine, crest tartar control, cymbalta  [duloxetine  hcl], duloxetine , gabapentin, lyrica  [pregabalin ], rosuvastatin  calcium , and zolpidem tartrate.  MEDICATIONS:  Current Outpatient Medications  Medication Sig Dispense Refill   albuterol  (VENTOLIN  HFA) 108 (90 Base) MCG/ACT inhaler Inhale 2 puffs into the lungs every 4 (four) hours as needed for wheezing or shortness of breath.     ALPRAZolam  (XANAX ) 0.5 MG tablet Take 0.5  mg by mouth daily as needed for anxiety or sleep.     Ascorbic Acid (VITAMIN C PO) Take 1 tablet by mouth daily.     atenolol  (TENORMIN ) 100 MG tablet TAKE 1/2 TABLET BY MOUTH TWICE A DAY 90 tablet 0   B Complex-C (SUPER B COMPLEX PO) Take 1 tablet by mouth daily.     Biotin 89999 MCG TBDP Take 10,000 mcg by mouth daily.     CALCIUM -VITAMIN D PO Take 1 tablet by mouth daily.     ELIQUIS  5 MG TABS tablet TAKE 1 TABLET BY MOUTH TWICE A DAY 180 tablet 1   furosemide  (LASIX ) 20 MG tablet Take one tablet (20 mg) as needed for swelling but call the office if taking more than twice a week. 90 tablet 3   hydrochlorothiazide  (MICROZIDE ) 12.5 MG capsule TAKE 1 CAPSULE BY MOUTH EVERY DAY 90 capsule 3   irbesartan  (AVAPRO ) 300 MG tablet Take 300 mg by mouth every morning.     levothyroxine  (SYNTHROID ) 25 MCG tablet Take 25 mcg by mouth daily before breakfast.     lidocaine -prilocaine  (EMLA ) cream Apply 1 Application topically 2 (two) times daily as needed (feet burning/pain.).     meclizine (ANTIVERT) 25 MG tablet Take 25 mg by mouth every 12 (twelve) hours as needed for dizziness.     METAMUCIL FIBER PO Take 3 each by  mouth daily. Chewables     Multiple Vitamin (MULTIVITAMIN) tablet Take 1 tablet by mouth daily.  Centrum     omeprazole  (PRILOSEC  OTC) 20 MG tablet Take 10 mg by mouth daily before breakfast.     ondansetron  (ZOFRAN ) 4 MG tablet Take 4 mg by mouth daily as needed for nausea or vomiting.     Polyethyl Glycol-Propyl Glycol (SYSTANE) 0.4-0.3 % SOLN Place 1 drop into both eyes 3 (three) times daily as needed (dry/irritated eyes.).     promethazine  (PHENERGAN ) 25 MG tablet Take 25 mg by mouth every 6 (six) hours as needed for nausea or vomiting.     rosuvastatin  (CRESTOR ) 5 MG tablet TAKE 1 TABLET (5 MG TOTAL) BY MOUTH DAILY. 90 tablet 3   scopolamine (TRANSDERM-SCOP) 1 MG/3DAYS Place 1 patch onto the skin every 3 (three) days as needed (nausea).     Selenium 100 MCG TABS Take 100 mcg by mouth daily.     traZODone (DESYREL) 50 MG tablet Take 50 mg by mouth at bedtime as needed for sleep.     valACYclovir (VALTREX) 1000 MG tablet Take 2,000 mg by mouth 2 (two) times daily as needed (fever blisters).     No current facility-administered medications for this visit.    REVIEW OF SYSTEMS:   Constitutional: Denies fevers, chills or abnormal night sweats   All other systems were reviewed with the patient and are negative.  PHYSICAL EXAMINATION: ECOG PERFORMANCE STATUS: 2 - Symptomatic, <50% confined to bed  Vitals:   08/17/24 1240  BP: (!) 118/56  Pulse: (!) 53  Resp: 18  Temp: 97.9 F (36.6 C)  SpO2: 94%   Filed Weights   08/17/24 1240  Weight: 160 lb 4.8 oz (72.7 kg)    GENERAL:alert, no distress and comfortable    LABORATORY DATA:  I have reviewed the data as listed Lab Results  Component Value Date   WBC 8.2 08/17/2024   HGB 11.0 (L) 08/17/2024   HCT 31.2 (L) 08/17/2024   MCV 85.2 08/17/2024   PLT 314 08/17/2024   Lab Results  Component Value Date   NA  122 (L) 08/17/2024   K 4.2 08/17/2024   CL 83 (L) 08/17/2024   CO2 31 08/17/2024    RADIOGRAPHIC STUDIES: I have  personally reviewed the radiological reports and agreed with the findings in the report.  ASSESSMENT AND PLAN:  Malignant neoplasm of upper-inner quadrant of right breast in female, estrogen receptor positive (HCC) 08/08/2024: Screening mammogram detected right breast asymmetry measuring 1 cm spiculated at 1 o'clock position.  By ultrasound it measured 0.8 cm, axilla negative, ultrasound biopsy: Grade 2 IDC, no LVI, ER 100%, PR 90%, Ki67 10%, HER2 negative by FISH  Pathology and radiology counseling: Discussed with the patient, the details of pathology including the type of breast cancer,the clinical staging, the significance of ER, PR and HER-2/neu receptors and the implications for treatment. After reviewing the pathology in detail, we proceeded to discuss the different treatment options    Treatment plan: Breast conserving surgery Radiation therapy versus antiestrogen therapy: After much discussion because of her difficulty swallowing pills and her preference to do a short course of radiation rather than 5 years of antiestrogen therapy, we decided that she does not need hormone therapy.  Return to clinic on an as-needed basis    All questions were answered. The patient knows to call the clinic with any problems, questions or concerns.    Viinay K Edmund Rick, MD 08/17/24

## 2024-08-17 NOTE — Progress Notes (Signed)
 REFERRING PROVIDER: Ebbie Cough, MD [address] [place],  [place] [address]  PRIMARY PROVIDER:  Rolinda Millman, MD  PRIMARY REASON FOR VISIT:  1. Family history of colon cancer   2. Family history of uterine cancer   3. Family history of pancreatic cancer   4. Malignant neoplasm of upper-inner quadrant of right breast in female, estrogen receptor positive (HCC)      HISTORY OF PRESENT ILLNESS:   Tabitha Murphy, a 83 y.o. female, was seen for a [facility] cancer genetics consultation at the request of Dr. Ebbie due to a personal and family history of cancer.  [name] presents to clinic today to discuss the possibility of a hereditary predisposition to cancer, genetic testing, and to further clarify her future cancer risks, as well as potential cancer risks for family members.   In September 2025, at the age of 89, [name] was diagnosed with cancer of the right breast. The treatment plan includes lumpectomy and AI, possible radiation.    CANCER HISTORY:  Oncology History  Malignant neoplasm of upper-inner quadrant of right breast in female, estrogen receptor positive (HCC)  08/08/2024 Initial Diagnosis   Screening mammogram detected right breast asymmetry measuring 1 cm spiculated at 1 o'clock position.  By ultrasound it measured 0.8 cm, axilla negative, ultrasound biopsy: Grade 2 IDC, no LVI, ER 100%, PR 90%, Ki67 10%, HER2 negative by FISH     Past Medical History:  Diagnosis Date   Anxiety    Arthritis    Benign essential HTN    CAD (coronary artery disease), native coronary artery    Cath 2012 with 30% LCx; Coronary CTA showed minimal mixed nonobstructive disease.  Less than 25% ostial LAD and left circumflex with coronary calcium  score 41.7 on  12/2022   Complication of anesthesia    slow to wake up   DCM (dilated cardiomyopathy) (HCC)    ? tachy mediated from afib with RVR.  EF 45-50% by echo 01/2021.  Lexiscan  myoview  with no ischemia    Diverticulosis    Dyspnea    Family history of colon cancer    Family history of pancreatic cancer    Family history of uterine cancer    Heart murmur    Hiatal hernia    HLD (hyperlipidemia)    Persistent atrial fibrillation (HCC)    On DOAC   PONV (postoperative nausea and vomiting)    Pre-diabetes    S/P laparoscopic cholecystectomy 07/28/2011   23 years ago   Sleep apnea    cpap   Thyroid  goiter     Past Surgical History:  Procedure Laterality Date   BALLOON DILATION N/A 03/24/2023   Procedure: BALLOON DILATION;  Surgeon: Saintclair Jasper, MD;  Location: [facility];  Service: Gastroenterology;  Laterality: N/A;   CARDIOVERSION N/A 02/29/2024   Procedure: CARDIOVERSION;  Surgeon: Barbaraann Darryle Ned, MD;  Location: [facility] INVASIVE CV LAB;  Service: Cardiovascular;  Laterality: N/A;   child birth     x3   CHOLECYSTECTOMY     ESOPHAGEAL MANOMETRY N/A 07/22/2023   Procedure: ESOPHAGEAL MANOMETRY (EM);  Surgeon: Dianna Specking, MD;  Location: [facility];  Service: Gastroenterology;  Laterality: N/A;   ESOPHAGOGASTRODUODENOSCOPY (EGD) WITH PROPOFOL  N/A 03/24/2023   Procedure: ESOPHAGOGASTRODUODENOSCOPY (EGD) WITH PROPOFOL ;  Surgeon: Saintclair Jasper, MD;  Location: [facility];  Service: Gastroenterology;  Laterality: N/A;   TOTAL HIP ARTHROPLASTY Left 04/20/2024   Procedure: ARTHROPLASTY, HIP, TOTAL, ANTERIOR APPROACH;  Surgeon: Melodi Lerner, MD;  Location: [facility];  Service: Orthopedics;  Laterality: Left;    Social History   Socioeconomic History   Marital status: Widowed    Spouse name: Not on file   Number of children: Not on file   Years of education: Not on file   Highest education level: Not on file  Occupational History   Occupation: retired  Tobacco Use   Smoking status: Never   Smokeless tobacco: Never   Tobacco comments:    Never smoke 02/18/22  Vaping Use   Vaping status: Never Used  Substance and Sexual Activity   Alcohol use: No   Drug use: No    Comment:  CBD gummies   Sexual activity: Not on file  Other Topics Concern   Not on file  Social History Narrative   Not on file   Social Drivers of Health   Financial Resource Strain: Not on file  Food Insecurity: No Food Insecurity (08/17/2024)   Hunger Vital Sign    Worried About Running Out of Food in the Last Year: Never true    Ran Out of Food in the Last Year: Never true  Transportation Needs: No Transportation Needs (08/17/2024)   PRAPARE - Administrator, Civil Service (Medical): No    Lack of Transportation (Non-Medical): No  Physical Activity: Not on file  Stress: Not on file  Social Connections: Moderately Integrated (04/20/2024)   Social Connection and Isolation Panel    Frequency of Communication with Friends and Family: More than three times a week    Frequency of Social Gatherings with Friends and Family: Three times a week    Attends Religious Services: More than 4 times per year    Active Member of Clubs or Organizations: No    Attends Banker Meetings: 1 to 4 times per year    Marital Status: Widowed     FAMILY HISTORY:  We obtained a detailed, 4-generation family history.  Significant diagnoses are listed below: Family History  Problem Relation Age of Onset   Uterine cancer Mother 92   Heart attack Mother    Heart attack Father        Died in early 110s with MI   CAD Sister    Diabetes Sister    Hypertension Sister    Diabetes Sister    Hypertension Sister    Hypertension Sister    Heart attack Sister 67   Hypertension Sister    Heart disease Brother    Heart attack Brother    Heart attack Brother    Heart attack Brother    Heart attack Brother 2   Colon cancer Brother 67   Pancreatic cancer Maternal Aunt        dx. in her 92s   CVA Maternal Grandmother    CVA Maternal Grandfather    Diabetes Paternal Grandmother      The patient has three children who are cancer free.  She has four brothers and four sisters, one brother was  diagnosed with colon cancer at 11. Both parents are deceased.  Her mother died at 46 from uterine cancer she developed at age 40.  Her mother had eight siblings, one sister had pancreatic cancer.  There was no other cancer reported in the family.  [name] is unaware of previous family history of genetic testing for hereditary cancer risks. There is no reported Ashkenazi Jewish ancestry. There is no known consanguinity.  GENETIC COUNSELING ASSESSMENT: Ms. Garr is a 83 y.o. female with a personal and family  history of cancer which is somewhat suggestive of a hereditary cancer syndrome and predisposition to cancer given the combination of cancer. We, therefore, discussed and recommended the following at today's visit.   DISCUSSION: We discussed that, in general, most cancer is not inherited in families, but instead is sporadic or familial. Sporadic cancers occur by chance and typically happen at older ages (>50 years) as this type of cancer is caused by genetic changes acquired during an individual's lifetime. Some families have more cancers than would be expected by chance; however, the ages or types of cancer are not consistent with a known genetic mutation or known genetic mutations have been ruled out. This type of familial cancer is thought to be due to a combination of multiple genetic, environmental, hormonal, and lifestyle factors. While this combination of factors likely increases the risk of cancer, the exact source of this risk is not currently identifiable or testable.  We discussed that 5 - 10% of breast cancer is hereditary, with most cases associated with BRCA mutations.  There are other genes that can be associated with hereditary breast cancer syndromes.  These include ATM, CHEK2 and PALB2.  We discussed that testing is beneficial for several reasons including knowing how to follow individuals after completing their treatment, identifying whether potential treatment options such as  PARP inhibitors would be beneficial, and understand if other family members could be at risk for cancer and allow them to undergo genetic testing.   We reviewed the characteristics, features and inheritance patterns of hereditary cancer syndromes. We also discussed genetic testing, including the appropriate family members to test, the process of testing, insurance coverage and turn-around-time for results. We discussed the implications of a negative, positive, carrier and/or variant of uncertain significant result. Ms. Christenberry  was offered a common hereditary cancer panel (36+ genes) and an expanded pan-cancer panel (70+ genes). Ms. Daw was informed of the benefits and limitations of each panel, including that expanded pan-cancer panels contain genes that do not have clear management guidelines at this point in time.  We also discussed that as the number of genes included on a panel increases, the chances of variants of uncertain significance increases. Ms. Overbay decided to pursue genetic testing for the CancerNext-Expanded+RNAinsight gene panel.   The CancerNext-Expanded gene panel offered by [facility] and includes sequencing, rearrangement, and RNA analysis for the following 77 genes: AIP, ALK, APC, ATM, BAP1, BARD1, BMPR1A, BRCA1, BRCA2, BRIP1, CDC73, CDH1, CDK4, CDKN1B, CDKN2A, CEBPA, CHEK2, CTNNA1, DDX41, DICER1, ETV6, FH, FLCN, GATA2, LZTR1, MAX, MBD4, MEN1, MET, MLH1, MSH2, MSH3, MSH6, MUTYH, NF1, NF2, NTHL1, PALB2, PHOX2B, PMS2, POT1, PRKAR1A, PTCH1, PTEN, RAD51C, RAD51D, RB1, RET, RPS20, RUNX1, SDHA, SDHAF2, SDHB, SDHC, SDHD, SMAD4, SMARCA4, SMARCB1, SMARCE1, STK11, SUFU, TMEM127, TP53, TSC1, TSC2, VHL, and WT1 (sequencing and deletion/duplication); AXIN2, CTNNA1, DDX41, EGFR, HOXB13, KIT, MBD4, MITF, MSH3, PDGFRA, POLD1 and POLE (sequencing only); EPCAM and GREM1 (deletion/duplication only). RNA data is routinely analyzed for use in variant interpretation for all genes.   Based on Ms.  Markarian's personal and family history of cancer, she meets medical criteria for genetic testing. Despite that she meets criteria, she may still have an out of pocket cost. We discussed that if her out of pocket cost for testing is over $100, the laboratory will call and confirm whether she wants to proceed with testing.  If the out of pocket cost of testing is less than $100 she will be billed by the genetic testing laboratory.   PLAN: After considering  the risks, benefits, and limitations, Ms. Lang provided informed consent to pursue genetic testing and the blood sample was sent to [facility] for analysis of the CancerNext-Expanded+RNAinsight. Results should be available within approximately 2-3 weeks' time, at which point they will be disclosed by telephone to Ms. Scorza, as will any additional recommendations warranted by these results. Ms. Lauricella will receive a summary of her genetic counseling visit and a copy of her results once available. This information will also be available in Epic.   Lastly, we encouraged Ms. Apsey to remain in contact with cancer genetics annually so that we can continuously update the family history and inform her of any changes in cancer genetics and testing that may be of benefit for this family.   Ms. Kirkeby questions were answered to her satisfaction today. Our contact information was provided should additional questions or concerns arise. Thank you for the referral and allowing us  to share in the care of your patient.   Ronee Ranganathan P. Perri, MS, CGC Licensed, Patent attorney [email or web address] phone: [phone]  In total, 40 minutes were spent on the date of the encounter in service to the patient including preparation, face-to-face consultation, documentation and care coordination.  The patient brought her daughter. Drs. Lanny Stalls, and/or Gudena were available for questions, if needed..     _______________________________________________________________________ For Office Staff:  Number of people involved in session: 2 Was an Intern/ student involved with case: no

## 2024-08-17 NOTE — Assessment & Plan Note (Signed)
 08/08/2024: Screening mammogram detected right breast asymmetry measuring 1 cm spiculated at 1 o'clock position.  By ultrasound it measured 0.8 cm, axilla negative, ultrasound biopsy: Grade 2 IDC, no LVI, ER 100%, PR 90%, Ki67 10%, HER2 negative by FISH  Pathology and radiology counseling: Discussed with the patient, the details of pathology including the type of breast cancer,the clinical staging, the significance of ER, PR and HER-2/neu receptors and the implications for treatment. After reviewing the pathology in detail, we proceeded to discuss the different treatment options    Treatment plan: Breast conserving surgery Radiation therapy versus antiestrogen therapy  Return to clinic after surgery to discuss final pathology report and to finalize adjuvant treatment plan.

## 2024-08-18 ENCOUNTER — Telehealth: Payer: Self-pay

## 2024-08-18 NOTE — Progress Notes (Signed)
 Patient's hip surgery was cancelled d/t patient's need for breast surgery by Dr. Ebbie.

## 2024-08-18 NOTE — Telephone Encounter (Signed)
   Pre-operative Risk Assessment    Patient Name: Tabitha Murphy  DOB: 1941/06/08 MRN: [id number]   Date of last office visit: 07/14/24 OLE HOLTS, MD Date of next office visit: NONE   Request for Surgical Clearance    Procedure:  BREAST CANCER SURGERY/ RIGHT BREAST SEED GUIDED LUMPECTOMY  Date of Surgery:  Clearance TBD   (PER REQUEST PT INTERESTED IN HAVING SURGERY ASAP)                             Surgeon:  DR DONNICE BURY Surgeon's Group or Practice Name:  [facility] Phone number:  [phone] Fax number:  [phone]  ATTN: RUSSELL CHRISTINE, CMA   Type of Clearance Requested:   - Medical  - Pharmacy:  Hold Apixaban  (Eliquis )     Type of Anesthesia:  General    Additional requests/questions:    Signed, Lucie DELENA Ku   08/18/2024, 3:07 PM

## 2024-08-19 ENCOUNTER — Encounter: Payer: Self-pay | Admitting: *Deleted

## 2024-08-19 ENCOUNTER — Telehealth: Payer: Self-pay | Admitting: *Deleted

## 2024-08-19 ENCOUNTER — Encounter (HOSPITAL_COMMUNITY)
Admission: RE | Admit: 2024-08-19 | Discharge: 2024-08-19 | Disposition: A | Discharge status: Home / Self Care | Source: Ambulatory Visit | Attending: Family Medicine | Admitting: Family Medicine

## 2024-08-19 DIAGNOSIS — Z170 Estrogen receptor positive status [ER+]: Secondary | ICD-10-CM

## 2024-08-19 NOTE — Telephone Encounter (Signed)
 Needs clarification. Please advise.

## 2024-08-19 NOTE — Telephone Encounter (Signed)
 Left message for a return phone call to follow up from [facility] and answer any questions she has.

## 2024-08-19 NOTE — Telephone Encounter (Signed)
 Russell from [facility] is requesting a callback at [phone] for more clarity on holding the Eliquis . She stated they need to know how many days prior and if the pt is cleared for the breast cancer surgery. The fax number is [phone]. Please advise.

## 2024-08-20 ENCOUNTER — Other Ambulatory Visit: Payer: Self-pay | Admitting: Cardiology

## 2024-08-20 DIAGNOSIS — I10 Essential (primary) hypertension: Secondary | ICD-10-CM

## 2024-08-22 ENCOUNTER — Encounter (HOSPITAL_COMMUNITY)
Admission: RE | Admit: 2024-08-22 | Discharge: 2024-08-22 | Disposition: A | Discharge status: Home / Self Care | Source: Ambulatory Visit | Attending: General Surgery

## 2024-08-22 ENCOUNTER — Other Ambulatory Visit: Payer: Self-pay

## 2024-08-22 ENCOUNTER — Encounter (HOSPITAL_COMMUNITY): Payer: Self-pay

## 2024-08-22 VITALS — BP 119/85 | HR 53 | Temp 98.2°F | Resp 17 | Ht 61.0 in | Wt 158.2 lb

## 2024-08-22 DIAGNOSIS — I2510 Atherosclerotic heart disease of native coronary artery without angina pectoris: Secondary | ICD-10-CM | POA: Insufficient documentation

## 2024-08-22 DIAGNOSIS — Z01818 Encounter for other preprocedural examination: Secondary | ICD-10-CM

## 2024-08-22 DIAGNOSIS — Z7901 Long term (current) use of anticoagulants: Secondary | ICD-10-CM | POA: Insufficient documentation

## 2024-08-22 DIAGNOSIS — I10 Essential (primary) hypertension: Secondary | ICD-10-CM | POA: Diagnosis not present

## 2024-08-22 DIAGNOSIS — C50911 Malignant neoplasm of unspecified site of right female breast: Secondary | ICD-10-CM | POA: Insufficient documentation

## 2024-08-22 DIAGNOSIS — E871 Hypo-osmolality and hyponatremia: Secondary | ICD-10-CM | POA: Diagnosis not present

## 2024-08-22 DIAGNOSIS — I281 Aneurysm of pulmonary artery: Secondary | ICD-10-CM | POA: Insufficient documentation

## 2024-08-22 DIAGNOSIS — I700 Atherosclerosis of aorta: Secondary | ICD-10-CM | POA: Insufficient documentation

## 2024-08-22 DIAGNOSIS — I4819 Other persistent atrial fibrillation: Secondary | ICD-10-CM | POA: Diagnosis not present

## 2024-08-22 DIAGNOSIS — Z01812 Encounter for preprocedural laboratory examination: Secondary | ICD-10-CM | POA: Diagnosis not present

## 2024-08-22 LAB — URINALYSIS, ROUTINE W REFLEX MICROSCOPIC
Bilirubin Urine: NEGATIVE
Glucose, UA: NEGATIVE mg/dL
Hgb urine dipstick: NEGATIVE
Ketones, ur: NEGATIVE mg/dL
Nitrite: POSITIVE — AB
Protein, ur: 30 mg/dL — AB
Specific Gravity, Urine: 1.005 (ref 1.005–1.030)
WBC, UA: >50 WBC/hpf (ref 0–5)
pH: 7 (ref 5.0–8.0)

## 2024-08-22 LAB — BASIC METABOLIC PANEL WITH GFR
Anion gap: 14 (ref 5–15)
BUN: 8 mg/dL (ref 8–23)
CO2: 29 mmol/L (ref 22–32)
Calcium: 9.9 mg/dL (ref 8.9–10.3)
Chloride: 80 mmol/L — ABNORMAL LOW (ref 98–111)
Creatinine, Ser: 0.83 mg/dL (ref 0.44–1.00)
GFR, Estimated: >60 mL/min (ref >60)
Glucose, Bld: 116 mg/dL — ABNORMAL HIGH (ref 70–99)
Potassium: 4.1 mmol/L (ref 3.5–5.1)
Sodium: 123 mmol/L — ABNORMAL LOW (ref 135–145)

## 2024-08-22 NOTE — Telephone Encounter (Signed)
 Pharmacy, sorry I know you all are crazy busy. Can you please take a look at this clearance form whenever you get a quick second and provide recommendations for holding Eliquis ? She needs surgery for breast cancer ASAP.  Pre-op covering team, can you please update the surgeon's office on what we are waiting on?  Thank you!

## 2024-08-22 NOTE — Telephone Encounter (Signed)
 Patient with diagnosis of Afib on Eliquis  for anticoagulation.    Procedure: BREAST CANCER SURGERY/ RIGHT BREAST SEED GUIDED LUMPECTOMY  Date of procedure: Clearance TBD    CHA2DS2-VASc Score = 5   This indicates a 7.2% annual risk of stroke. The patient's score is based upon: CHF History: 0 HTN History: 1 Diabetes History: 0 Stroke History: 0 Vascular Disease History: 1 Age Score: 2 Gender Score: 1   CrCl 47 mL/min  Platelet count 314 K   Patient has not  had an Afib/aflutter ablation or Watchman within the last 3 months or DCCV within the last 30 days    Per office protocol, patient can hold Eliquis  for 2 days prior to procedure.   Patient will not need bridging with Lovenox (enoxaparin) around procedure.  **This guidance is not considered finalized until pre-operative APP has relayed final recommendations.**

## 2024-08-22 NOTE — Progress Notes (Signed)
 Surgical Instructions   Your procedure is scheduled on Thursday, September 25th. Report to [facility] Main Entrance A at 12:30 P.M., then check in with the Admitting office. Any questions or running late day of surgery: call [phone]  Questions prior to your surgery date: call [phone], Monday-Friday, 8am-4pm. If you experience any cold or flu symptoms such as cough, fever, chills, shortness of breath, etc. between now and your scheduled surgery, please notify us  at the above number.     Remember:  Do not eat after midnight the night before your surgery   You may drink clear liquids until 11:30 the morning of your surgery.   Clear liquids allowed are: Water , Non-Citrus Juices (without pulp), Carbonated Beverages, Clear Tea (no milk, honey, etc.), Black Coffee Only (NO MILK, CREAM OR POWDERED CREAMER of any kind), and Gatorade.    Patient Instructions  The night before surgery:  No food after midnight. ONLY clear liquids after midnight  The day of surgery (if you do NOT have diabetes):  Drink ONE (1) Pre-Surgery Clear Ensure by 11:30 the morning of surgery. Drink in one sitting. Do not sip.  This drink was given to you during your hospital pre-op appointment visit.  Nothing else to drink after completing the Pre-Surgery Clear Ensure.         If you have questions, please contact your surgeon's office.   Take these medicines the morning of surgery with A SIP OF WATER   atenolol  (TENORMIN )  levothyroxine  (SYNTHROID )  omeprazole  (PRILOSEC  OTC)  rosuvastatin  (CRESTOR )    May take these medicines IF NEEDED: albuterol  (VENTOLIN  HFA) inhaler - bring with you on day of surgery  ALPRAZolam  (XANAX )  meclizine (ANTIVERT)  ondansetron  (ZOFRAN )  Polyethyl Glycol-Propyl Glycol (SYSTANE) eye drops  promethazine  (PHENERGAN )  valACYclovir (VALTREX)    Per your physician's instructions, HOLD your ELIQUIS  for 3 days prior to surgery.  Last dose should be on Sunday, Sept.  21st.   One week prior to surgery, STOP taking any Aspirin  (unless otherwise instructed by your surgeon) Aleve, Naproxen, Ibuprofen, Motrin, Advil, Goody's, BC's, all herbal medications, fish oil, and non-prescription vitamins.                     Do NOT Smoke (Tobacco/Vaping) for 24 hours prior to your procedure.  If you use a CPAP at night, you may bring your mask/headgear for your overnight stay.   You will be asked to remove any contacts, glasses, piercing's, hearing aid's, dentures/partials prior to surgery. Please bring cases for these items if needed.    Patients discharged the day of surgery will not be allowed to drive home, and someone needs to stay with them for 24 hours.  SURGICAL WAITING ROOM VISITATION Patients may have no more than 2 support people in the waiting area - these visitors may rotate.   Pre-op nurse will coordinate an appropriate time for 1 ADULT support person, who may not rotate, to accompany patient in pre-op.  Children under the age of 68 must have an adult with them who is not the patient and must remain in the main waiting area with an adult.  If the patient needs to stay at the hospital during part of their recovery, the visitor guidelines for inpatient rooms apply.  Please refer to the [facility] website for the visitor guidelines for any additional information.   If you received a COVID test during your pre-op visit  it is requested that you wear a mask when out in  public, stay away from anyone that may not be feeling well and notify your surgeon if you develop symptoms. If you have been in contact with anyone that has tested positive in the last 10 days please notify you surgeon.      Pre-operative CHG Bathing Instructions   You can play a key role in reducing the risk of infection after surgery. Your skin needs to be as free of germs as possible. You can reduce the number of germs on your skin by washing with CHG (chlorhexidine  gluconate) soap  before surgery. CHG is an antiseptic soap that kills germs and continues to kill germs even after washing.   DO NOT use if you have an allergy to chlorhexidine /CHG or antibacterial soaps. If your skin becomes reddened or irritated, stop using the CHG and notify one of our RNs at [phone].              TAKE A SHOWER THE NIGHT BEFORE SURGERY AND THE DAY OF SURGERY    Please keep in mind the following:  DO NOT shave, including legs and underarms, 48 hours prior to surgery.   You may shave your face before/day of surgery.  Place clean sheets on your bed the night before surgery Use a clean washcloth (not used since being washed) for each shower. DO NOT sleep with pet's night before surgery.  CHG Shower Instructions:  Wash your face and private area with normal soap. If you choose to wash your hair, wash first with your normal shampoo.  After you use shampoo/soap, rinse your hair and body thoroughly to remove shampoo/soap residue.  Turn the water  OFF and apply half the bottle of CHG soap to a CLEAN washcloth.  Apply CHG soap ONLY FROM YOUR NECK DOWN TO YOUR TOES (washing for 3-5 minutes)  DO NOT use CHG soap on face, private areas, open wounds, or sores.  Pay special attention to the area where your surgery is being performed.  If you are having back surgery, having someone wash your back for you may be helpful. Wait 2 minutes after CHG soap is applied, then you may rinse off the CHG soap.  Pat dry with a clean towel  Put on clean pajamas    Additional instructions for the day of surgery: DO NOT APPLY any lotions, deodorants, cologne, or perfumes.   Do not wear jewelry or makeup Do not wear nail polish, gel polish, artificial nails, or any other type of covering on natural nails (fingers and toes) Do not bring valuables to the hospital. [facility] is not responsible for valuables/personal belongings. Put on clean/comfortable clothes.  Please brush your teeth.  Ask your nurse before  applying any prescription medications to the skin.

## 2024-08-22 NOTE — Telephone Encounter (Addendum)
   Patient Name: Tabitha Murphy  DOB: 04-25-1941 MRN: [id number]  Primary Cardiologist: Wilbert Bihari, MD  Chart reviewed as part of pre-operative protocol coverage. Patient was recently seen by Dr. Cindie on 07/14/2024 and pre-op evaluation was completed for an orthopedic surgery. He was felt to be at acceptable risk for a hip replacement. Orthopedic surgery has since been canceled but she is now in need of surgery for breast cancer that they would like to do ASAP. Based on ACC/AHA guidelines, patient would be at acceptable risk for the planned procedure without further cardiovascular testing.  Per Pharmacy and office protocol: Patient can hold Eliquis  for 2 days prior to procedure.  Patient will not need bridging with Lovenox (enoxaparin) around procedure.Please resume Eliquis  as soon as safely possible afterwards.   I will route this recommendation to the requesting party via Epic fax function and remove from pre-op pool.  Please call with questions.  Loraina Stauffer E Opaline Reyburn, PA-C 08/22/2024, 4:26 PM

## 2024-08-22 NOTE — Progress Notes (Signed)
 This RN notified Dr. Gelene office of the urinalysis results and the low sodium.

## 2024-08-22 NOTE — Progress Notes (Signed)
 Anesthesia Chart Review: Same day workup  83 year old female follows with cardiology for history of persistent atrial fibrillation on Eliquis , HTN.  Coronary CTA 12/26/2022 with minimal mixed nonobstructive CAD.  Echo 08/13/2022 with LVEF 55 to 60%, grade 2 DD, normal RV systolic function, no significant valvular abnormalities.  Recently seen by Dr. Cindie on 07/14/2024 for preop evaluation prior to undergoing proposed hip replacement.  Per note, I think she is at acceptable risk to undergo planned orthopedic surgery. Okay to hold Eliquis  for 3 days prior prior to the procedure and restart when felt safe from a surgical perspective.  Patient was subsequently diagnosed with right breast cancer.  She was seen by Dr. Ebbie and recommended to undergo right breast seed guided lumpectomy.  Patient reports last dose Eliquis  08/21/2024.  CMP at cancer center on 08/17/2024 notable for hyponatremia sodium 122.  Per review of records she has a history of chronic mild to moderate hyponatremia with baseline sodium ~132.  Patient reported that she has previously been advised to restrict water  intake to 38 ounces per day.  She takes Lasix  approximately once per week for fluid retention.  She is on HCTZ 12.5 mg daily.  Repeat BMP 08/22/2024 with persistently low sodium 123.  Patient reported symptoms of UTI at preadmission testing appointment.  Urinalysis concerning for infection with large leukocytes and positive nitrites.  Results called to surgeons office.   Coronary CTA 12/26/2022: IMPRESSION: 1. Minimal mixed non-obstructive CAD, CADRADS = 1.   2. Coronary calcium  score of 41.7. This was 31st percentile for age and sex matched control.   3. Normal coronary origin with right dominance.   4. Dilated main pulmonary artery to 34 mm, suggestive of pulmonary hypertension.   5. Dilated coronary sinus at 14 mm.   6. Aortic atherosclerosis  TTE 08/13/2022: 1. Left ventricular ejection fraction, by  estimation, is 55 to 60%. The  left ventricle has normal function. The left ventricle has no regional  wall motion abnormalities. Left ventricular diastolic parameters are  consistent with Grade II diastolic  dysfunction (pseudonormalization).   2. Right ventricular systolic function is normal. The right ventricular  size is mildly enlarged.   3. The mitral valve is normal in structure. No evidence of mitral valve  regurgitation.   4. The aortic valve is normal in structure. Aortic valve regurgitation is  not visualized.   5. There is mild dilatation of the ascending aorta, measuring 37 mm.   6. The inferior vena cava is normal in size with greater than 50%  respiratory variability, suggesting right atrial pressure of 3 mmHg.   Comparison(s): No significant change from prior study.

## 2024-08-22 NOTE — Telephone Encounter (Signed)
 Will update the surgeons office that waiting on recommendations for holding Eliquis  from the Pharmacy.

## 2024-08-22 NOTE — Progress Notes (Addendum)
 PCP - Dr. Vernell Freund Cardiologist - Dr. Wilbert Bihari  PPM/ICD - Denies Device Orders - n/a Rep Notified - n/a  Chest x-ray - n/a EKG - 06-22-24 Stress Test - 12-15-22 ECHO - 08-13-22 Cardiac Cath - 02-19-11  Sleep Study - Yes, positive  CPAP - yes, wears nightly  Non-diabetic  Last dose of GLP1 agonist-  Denies GLP1 instructions: N/A  Blood Thinner Instructions: Eliquis  - hold 3 day's, Last dose on 08-21-24. Aspirin  Instructions: Denies  ERAS Protcol - Clears until 1130 PRE-SURGERY Ensure or G2- Ensure  COVID TEST- n/a   Anesthesia review: Yes, A-Fib, CAD, Sleep apnea, HTN Seed placement @ [facility] on 08/23/24 @ 2:00  Patient denies shortness of breath, fever, cough and chest pain at PAT appointment. Patient denies any respiratory issues at this time.    All instructions explained to the patient, with a verbal understanding of the material. Patient agrees to go over the instructions while at home for a better understanding. Patient also instructed to self quarantine after being tested for COVID-19. The opportunity to ask questions was provided.

## 2024-08-23 ENCOUNTER — Telehealth: Payer: Self-pay | Admitting: *Deleted

## 2024-08-23 ENCOUNTER — Encounter: Payer: Self-pay | Admitting: *Deleted

## 2024-08-23 DIAGNOSIS — C50811 Malignant neoplasm of overlapping sites of right female breast: Secondary | ICD-10-CM | POA: Diagnosis not present

## 2024-08-23 NOTE — Telephone Encounter (Signed)
 Called and spoke to patient to follow up from [facility] on 9/17. Discussed her upcoming seed placement and surgery. No other needs or concerns at this time.

## 2024-08-23 NOTE — Anesthesia Preprocedure Evaluation (Signed)
 Anesthesia Evaluation  Patient identified by MRN, date of birth, ID band Patient awake    Reviewed: Allergy & Precautions, NPO status , Patient's Chart, lab work & pertinent test results  History of Anesthesia Complications (+) PONV and history of anesthetic complications  Airway Mallampati: Unable to assess  TM Distance: >3 FB Neck ROM: Full    Dental   Pulmonary sleep apnea    breath sounds clear to auscultation       Cardiovascular hypertension, Pt. on medications + CAD  + dysrhythmias Atrial Fibrillation + Valvular Problems/Murmurs  Rhythm:Regular Rate:Normal  Gated Cardiac Study:   The study is normal. The study is low risk.   No ST deviation was noted.   Left ventricular function is normal. Nuclear stress EF: 58 %. The left ventricular ejection fraction is normal (55-65%). End diastolic cavity size is normal.   Prior study available for comparison from 02/16/2021.    Neuro/Psych   Anxiety      Neuromuscular disease    GI/Hepatic Neg liver ROS, hiatal hernia,,,  Endo/Other  negative endocrine ROS    Renal/GU negative Renal ROS     Musculoskeletal  (+) Arthritis ,    Abdominal   Peds  Hematology negative hematology ROS (+)   Anesthesia Other Findings   Reproductive/Obstetrics                              Anesthesia Physical Anesthesia Plan  ASA: 3  Anesthesia Plan: General   Post-op Pain Management: Tylenol  PO (pre-op)*   Induction: Intravenous  PONV Risk Score and Plan: 4 or greater and Ondansetron , Dexamethasone  and Treatment may vary due to age or medical condition  Airway Management Planned: LMA  Additional Equipment: None  Intra-op Plan:   Post-operative Plan: Extubation in OR  Informed Consent: I have reviewed the patients History and Physical, chart, labs and discussed the procedure including the risks, benefits and alternatives for the proposed anesthesia  with the patient or authorized representative who has indicated his/her understanding and acceptance.     Dental advisory given  Plan Discussed with:   Anesthesia Plan Comments: (Discussed abnormal labs in detail with Tabitha Murphy and daughter. Due to chronic nature of the labs, we will proceed with Tabitha Murphy understanding the risk. Tabitha Murphy will stay overnight and internal medicine will be consulted to manage her chronic abnormalities.   PAT note by Lynwood Hope, PA-C:  83 year old female follows with cardiology for history of persistent atrial fibrillation on Eliquis , HTN.  Coronary CTA 12/26/2022 with minimal mixed nonobstructive CAD.  Echo 08/13/2022 with LVEF 55 to 60%, grade 2 DD, normal RV systolic function, no significant valvular abnormalities.  Recently seen by Dr. Cindie on 07/14/2024 for preop evaluation prior to undergoing proposed hip replacement.  Per note, I think she is at acceptable risk to undergo planned orthopedic surgery. Okay to hold Eliquis  for 3 days prior prior to the procedure and restart when felt safe from a surgical perspective.  Patient was subsequently diagnosed with right breast cancer.  She was seen by Dr. Ebbie and recommended to undergo right breast seed guided lumpectomy.  Patient reports last dose Eliquis  08/21/2024.  CMP at cancer center on 08/17/2024 notable for hyponatremia sodium 122.  Per review of records she has a history of chronic mild to moderate hyponatremia with baseline sodium ~132.  Patient reported that she has previously been advised to restrict water  intake to 38 ounces per day.  She takes Lasix  approximately once per week for fluid retention.  She is on HCTZ 12.5 mg daily.  Repeat BMP 08/22/2024 with persistently low sodium 123.  Patient reported symptoms of UTI at preadmission testing appointment.  Urinalysis concerning for infection with large leukocytes and positive nitrites.  Results called to surgeon's office by preop  RN.  Hyponatremia discussed with anesthesiologist Dr. Darlyn.  He reached out to Dr. Ebbie who recommended proceeding as planned with recheck on day of surgery.  I-STAT ordered.  EKG 06/21/2024: Atrial fibrillation.  Rate 84.  Coronary CTA 12/26/2022: IMPRESSION: 1. Minimal mixed non-obstructive CAD, CADRADS = 1.  2. Coronary calcium  score of 41.7. This was 31st percentile for age and sex matched control.  3. Normal coronary origin with right dominance.  4. Dilated main pulmonary artery to 34 mm, suggestive of pulmonary hypertension.  5. Dilated coronary sinus at 14 mm.  6. Aortic atherosclerosis  TTE 08/13/2022: 1. Left ventricular ejection fraction, by estimation, is 55 to 60%. The  left ventricle has normal function. The left ventricle has no regional  wall motion abnormalities. Left ventricular diastolic parameters are  consistent with Grade II diastolic  dysfunction (pseudonormalization).  2. Right ventricular systolic function is normal. The right ventricular  size is mildly enlarged.  3. The mitral valve is normal in structure. No evidence of mitral valve  regurgitation.  4. The aortic valve is normal in structure. Aortic valve regurgitation is  not visualized.  5. There is mild dilatation of the ascending aorta, measuring 37 mm.  6. The inferior vena cava is normal in size with greater than 50%  respiratory variability, suggesting right atrial pressure of 3 mmHg.   Comparison(s): No significant change from prior study.     )         Anesthesia Quick Evaluation

## 2024-08-24 ENCOUNTER — Other Ambulatory Visit: Payer: Self-pay | Admitting: Cardiology

## 2024-08-24 ENCOUNTER — Other Ambulatory Visit (HOSPITAL_BASED_OUTPATIENT_CLINIC_OR_DEPARTMENT_OTHER): Payer: Self-pay | Admitting: Family

## 2024-08-24 DIAGNOSIS — I10 Essential (primary) hypertension: Secondary | ICD-10-CM

## 2024-08-24 NOTE — Progress Notes (Addendum)
 Pt aware of new arrival time 1030, ERAS 0930.

## 2024-08-24 NOTE — Telephone Encounter (Signed)
 Pt of Dr. Shlomo. Please advise on this refill.

## 2024-08-25 ENCOUNTER — Observation Stay (HOSPITAL_COMMUNITY)
Admission: RE | Admit: 2024-08-25 | Discharge: 2024-08-26 | Disposition: A | Discharge status: Home / Self Care | Attending: General Surgery | Admitting: General Surgery

## 2024-08-25 ENCOUNTER — Other Ambulatory Visit: Payer: Self-pay

## 2024-08-25 ENCOUNTER — Encounter (HOSPITAL_COMMUNITY): Payer: Self-pay | Admitting: General Surgery

## 2024-08-25 ENCOUNTER — Ambulatory Visit (HOSPITAL_COMMUNITY): Payer: Self-pay | Admitting: Physician Assistant

## 2024-08-25 ENCOUNTER — Encounter (HOSPITAL_COMMUNITY)
Admission: RE | Disposition: A | Discharge status: Home / Self Care | Payer: Self-pay | Source: Home / Self Care | Attending: General Surgery

## 2024-08-25 ENCOUNTER — Ambulatory Visit (HOSPITAL_BASED_OUTPATIENT_CLINIC_OR_DEPARTMENT_OTHER): Payer: Self-pay | Admitting: Anesthesiology

## 2024-08-25 DIAGNOSIS — C50911 Malignant neoplasm of unspecified site of right female breast: Secondary | ICD-10-CM | POA: Diagnosis not present

## 2024-08-25 DIAGNOSIS — Z79899 Other long term (current) drug therapy: Secondary | ICD-10-CM | POA: Insufficient documentation

## 2024-08-25 DIAGNOSIS — Z9889 Other specified postprocedural states: Secondary | ICD-10-CM | POA: Diagnosis not present

## 2024-08-25 DIAGNOSIS — E871 Hypo-osmolality and hyponatremia: Principal | ICD-10-CM | POA: Insufficient documentation

## 2024-08-25 DIAGNOSIS — I4891 Unspecified atrial fibrillation: Secondary | ICD-10-CM | POA: Diagnosis not present

## 2024-08-25 DIAGNOSIS — J45909 Unspecified asthma, uncomplicated: Secondary | ICD-10-CM | POA: Diagnosis not present

## 2024-08-25 DIAGNOSIS — C50211 Malignant neoplasm of upper-inner quadrant of right female breast: Principal | ICD-10-CM | POA: Insufficient documentation

## 2024-08-25 DIAGNOSIS — I2510 Atherosclerotic heart disease of native coronary artery without angina pectoris: Secondary | ICD-10-CM | POA: Diagnosis not present

## 2024-08-25 DIAGNOSIS — I10 Essential (primary) hypertension: Secondary | ICD-10-CM | POA: Diagnosis not present

## 2024-08-25 DIAGNOSIS — I4819 Other persistent atrial fibrillation: Secondary | ICD-10-CM | POA: Insufficient documentation

## 2024-08-25 DIAGNOSIS — Z1721 Progesterone receptor positive status: Secondary | ICD-10-CM | POA: Diagnosis not present

## 2024-08-25 DIAGNOSIS — Z7901 Long term (current) use of anticoagulants: Secondary | ICD-10-CM | POA: Diagnosis not present

## 2024-08-25 DIAGNOSIS — Z170 Estrogen receptor positive status [ER+]: Secondary | ICD-10-CM | POA: Diagnosis not present

## 2024-08-25 DIAGNOSIS — Z1732 Human epidermal growth factor receptor 2 negative status: Secondary | ICD-10-CM | POA: Diagnosis not present

## 2024-08-25 LAB — BASIC METABOLIC PANEL WITH GFR
Anion gap: 13 (ref 5–15)
Anion gap: 17 — ABNORMAL HIGH (ref 5–15)
BUN: 13 mg/dL (ref 8–23)
BUN: 11 mg/dL (ref 8–23)
CO2: 23 mmol/L (ref 22–32)
CO2: 22 mmol/L (ref 22–32)
Calcium: 9 mg/dL (ref 8.9–10.3)
Calcium: 9 mg/dL (ref 8.9–10.3)
Chloride: 86 mmol/L — ABNORMAL LOW (ref 98–111)
Chloride: 85 mmol/L — ABNORMAL LOW (ref 98–111)
Creatinine, Ser: 1.24 mg/dL — ABNORMAL HIGH (ref 0.44–1.00)
Creatinine, Ser: 1.07 mg/dL — ABNORMAL HIGH (ref 0.44–1.00)
GFR, Estimated: 43 mL/min — ABNORMAL LOW (ref >60)
GFR, Estimated: 52 mL/min — ABNORMAL LOW (ref >60)
Glucose, Bld: 104 mg/dL — ABNORMAL HIGH (ref 70–99)
Glucose, Bld: 109 mg/dL — ABNORMAL HIGH (ref 70–99)
Potassium: 3.2 mmol/L — ABNORMAL LOW (ref 3.5–5.1)
Potassium: 3.5 mmol/L (ref 3.5–5.1)
Sodium: 122 mmol/L — ABNORMAL LOW (ref 135–145)
Sodium: 124 mmol/L — ABNORMAL LOW (ref 135–145)

## 2024-08-25 LAB — POCT I-STAT, CHEM 8
BUN: 17 mg/dL (ref 8–23)
Calcium, Ion: 0.82 mmol/L — CL (ref 1.15–1.40)
Chloride: 88 mmol/L — ABNORMAL LOW (ref 98–111)
Creatinine, Ser: 1.2 mg/dL — ABNORMAL HIGH (ref 0.44–1.00)
Glucose, Bld: 141 mg/dL — ABNORMAL HIGH (ref 70–99)
HCT: 35 % — ABNORMAL LOW (ref 36.0–46.0)
Hemoglobin: 11.9 g/dL — ABNORMAL LOW (ref 12.0–15.0)
Potassium: 5.3 mmol/L — ABNORMAL HIGH (ref 3.5–5.1)
Sodium: 118 mmol/L — CL (ref 135–145)
TCO2: 24 mmol/L (ref 22–32)

## 2024-08-25 LAB — TSH: TSH: 1.169 u[IU]/mL (ref 0.350–4.500)

## 2024-08-25 SURGERY — LUMPECTOMY WITH MAGNETIC MARKER LOCALIZATION
Anesthesia: General | Site: Breast | Laterality: Right

## 2024-08-25 MED ORDER — FENTANYL CITRATE (PF) 250 MCG/5ML IJ SOLN
INTRAMUSCULAR | Status: DC | PRN
  Administered 2024-08-25: 50 ug via INTRAVENOUS

## 2024-08-25 MED ORDER — PHENYLEPHRINE 80 MCG/ML (10ML) SYRINGE FOR IV PUSH (FOR BLOOD PRESSURE SUPPORT)
PREFILLED_SYRINGE | INTRAVENOUS | Status: DC | PRN
  Administered 2024-08-25: 80 ug via INTRAVENOUS

## 2024-08-25 MED ORDER — SIMETHICONE 80 MG PO CHEW: 40.0000 mg | CHEWABLE_TABLET | Freq: Four times a day (QID) | ORAL | Status: DC | PRN

## 2024-08-25 MED ORDER — ENSURE PRE-SURGERY PO LIQD: 296.0000 mL | Freq: Once | ORAL | Status: DC

## 2024-08-25 MED ORDER — SODIUM CHLORIDE 0.9 % IV SOLN
INTRAVENOUS | Status: AC
Start: 2024-08-25 — End: 2024-08-25

## 2024-08-25 MED ORDER — CHLORHEXIDINE GLUCONATE CLOTH 2 % EX PADS: 6.0000 | MEDICATED_PAD | Freq: Once | CUTANEOUS | Status: DC

## 2024-08-25 MED ORDER — ACETAMINOPHEN 500 MG PO TABS
1000.0000 mg | ORAL_TABLET | ORAL | Status: DC
  Filled 2024-08-25: qty 2

## 2024-08-25 MED ORDER — ORAL CARE MOUTH RINSE: 15.0000 mL | Freq: Once | OROMUCOSAL | Status: AC

## 2024-08-25 MED ORDER — PANTOPRAZOLE SODIUM 40 MG PO TBEC
40.0000 mg | DELAYED_RELEASE_TABLET | Freq: Every day | ORAL | Status: DC
  Administered 2024-08-26: 40 mg via ORAL
  Filled 2024-08-25: qty 1

## 2024-08-25 MED ORDER — ACETAMINOPHEN 650 MG RE SUPP: 650.0000 mg | Freq: Four times a day (QID) | RECTAL | Status: DC | PRN

## 2024-08-25 MED ORDER — ACETAMINOPHEN 325 MG PO TABS: 650.0000 mg | ORAL_TABLET | Freq: Four times a day (QID) | ORAL | Status: DC | PRN

## 2024-08-25 MED ORDER — ACETAMINOPHEN 500 MG PO TABS
1000.0000 mg | ORAL_TABLET | Freq: Four times a day (QID) | ORAL | Status: DC | PRN
  Administered 2024-08-25: 1000 mg via ORAL
  Filled 2024-08-25: qty 2

## 2024-08-25 MED ORDER — TRAZODONE HCL 50 MG PO TABS: 50.0000 mg | ORAL_TABLET | Freq: Every evening | ORAL | Status: DC | PRN

## 2024-08-25 MED ORDER — ROSUVASTATIN CALCIUM 5 MG PO TABS
5.0000 mg | ORAL_TABLET | Freq: Every day | ORAL | Status: DC
  Administered 2024-08-26: 5 mg via ORAL
  Filled 2024-08-25: qty 1

## 2024-08-25 MED ORDER — GLYCOPYRROLATE PF 0.2 MG/ML IJ SOSY
PREFILLED_SYRINGE | INTRAMUSCULAR | Status: AC
Start: 2024-08-25 — End: 2024-08-25
  Filled 2024-08-25: qty 1

## 2024-08-25 MED ORDER — CHLORHEXIDINE GLUCONATE 0.12 % MT SOLN
15.0000 mL | Freq: Once | OROMUCOSAL | Status: AC
  Administered 2024-08-25: 15 mL via OROMUCOSAL
  Filled 2024-08-25: qty 15

## 2024-08-25 MED ORDER — POLYETHYLENE GLYCOL 3350 17 G PO PACK: 17.0000 g | PACK | Freq: Every day | ORAL | Status: DC | PRN

## 2024-08-25 MED ORDER — APIXABAN 5 MG PO TABS
5.0000 mg | ORAL_TABLET | Freq: Two times a day (BID) | ORAL | Status: DC
  Administered 2024-08-26: 5 mg via ORAL
  Filled 2024-08-25: qty 1

## 2024-08-25 MED ORDER — GLYCOPYRROLATE PF 0.2 MG/ML IJ SOSY
PREFILLED_SYRINGE | INTRAMUSCULAR | Status: DC | PRN
  Administered 2024-08-25: .1 mg via INTRAVENOUS

## 2024-08-25 MED ORDER — 0.9 % SODIUM CHLORIDE (POUR BTL) OPTIME
TOPICAL | Status: DC | PRN
  Administered 2024-08-25: 1000 mL

## 2024-08-25 MED ORDER — FENTANYL CITRATE (PF) 100 MCG/2ML IJ SOLN
INTRAMUSCULAR | Status: AC
  Filled 2024-08-25: qty 2

## 2024-08-25 MED ORDER — LEVOTHYROXINE SODIUM 25 MCG PO TABS
25.0000 ug | ORAL_TABLET | Freq: Every day | ORAL | Status: DC
  Administered 2024-08-26: 25 ug via ORAL
  Filled 2024-08-25: qty 1

## 2024-08-25 MED ORDER — LIDOCAINE 2% (20 MG/ML) 5 ML SYRINGE
INTRAMUSCULAR | Status: AC
  Filled 2024-08-25: qty 5

## 2024-08-25 MED ORDER — ONDANSETRON HCL 4 MG/2ML IJ SOLN
INTRAMUSCULAR | Status: AC
  Filled 2024-08-25: qty 2

## 2024-08-25 MED ORDER — PHENYLEPHRINE 80 MCG/ML (10ML) SYRINGE FOR IV PUSH (FOR BLOOD PRESSURE SUPPORT)
PREFILLED_SYRINGE | INTRAVENOUS | Status: AC
Start: 2024-08-25 — End: 2024-08-25
  Filled 2024-08-25: qty 10

## 2024-08-25 MED ORDER — EPHEDRINE SULFATE-NACL 50-0.9 MG/10ML-% IV SOSY
PREFILLED_SYRINGE | INTRAVENOUS | Status: DC | PRN
  Administered 2024-08-25 (×2): 5 mg via INTRAVENOUS

## 2024-08-25 MED ORDER — BUPIVACAINE-EPINEPHRINE 0.25% -1:200000 IJ SOLN
INTRAMUSCULAR | Status: DC | PRN
  Administered 2024-08-25: 10 mL

## 2024-08-25 MED ORDER — ALPRAZOLAM 0.5 MG PO TABS
0.5000 mg | ORAL_TABLET | Freq: Every day | ORAL | Status: DC | PRN
  Administered 2024-08-25: 0.5 mg via ORAL
  Filled 2024-08-25: qty 1

## 2024-08-25 MED ORDER — PROPOFOL 10 MG/ML IV BOLUS
INTRAVENOUS | Status: DC | PRN
  Administered 2024-08-25: 125 ug/kg/min via INTRAVENOUS
  Administered 2024-08-25: 100 mg via INTRAVENOUS
  Administered 2024-08-25: 40 mg via INTRAVENOUS

## 2024-08-25 MED ORDER — ONDANSETRON HCL 4 MG/2ML IJ SOLN
INTRAMUSCULAR | Status: DC | PRN
Start: 2024-08-25 — End: 2024-08-25
  Administered 2024-08-25: 4 mg via INTRAVENOUS

## 2024-08-25 MED ORDER — POLYETHYL GLYCOL-PROPYL GLYCOL 0.4-0.3 % OP SOLN
1.0000 [drp] | Freq: Three times a day (TID) | OPHTHALMIC | Status: DC | PRN
Start: 2024-08-25 — End: 2024-08-25

## 2024-08-25 MED ORDER — ONDANSETRON HCL 4 MG PO TABS: 4.0000 mg | ORAL_TABLET | Freq: Every day | ORAL | Status: DC | PRN

## 2024-08-25 MED ORDER — FENTANYL CITRATE (PF) 250 MCG/5ML IJ SOLN
INTRAMUSCULAR | Status: AC
  Filled 2024-08-25: qty 5

## 2024-08-25 MED ORDER — POLYVINYL ALCOHOL 1.4 % OP SOLN: 1.0000 [drp] | OPHTHALMIC | Status: DC | PRN

## 2024-08-25 MED ORDER — SODIUM CHLORIDE 0.9 % IV SOLN: INTRAVENOUS | Status: DC

## 2024-08-25 MED ORDER — EPHEDRINE 5 MG/ML INJ
INTRAVENOUS | Status: AC
  Filled 2024-08-25: qty 5

## 2024-08-25 MED ORDER — LACTATED RINGERS IV SOLN: INTRAVENOUS | Status: DC

## 2024-08-25 MED ORDER — BUPIVACAINE-EPINEPHRINE (PF) 0.25% -1:200000 IJ SOLN
INTRAMUSCULAR | Status: AC
  Filled 2024-08-25: qty 30

## 2024-08-25 MED ORDER — ONDANSETRON 4 MG PO TBDP: 4.0000 mg | ORAL_TABLET | Freq: Four times a day (QID) | ORAL | Status: DC | PRN

## 2024-08-25 MED ORDER — CEFAZOLIN SODIUM-DEXTROSE 2-4 GM/100ML-% IV SOLN
2.0000 g | INTRAVENOUS | Status: AC
  Administered 2024-08-25: 2 g via INTRAVENOUS
  Filled 2024-08-25: qty 100

## 2024-08-25 MED ORDER — ATENOLOL 25 MG PO TABS
50.0000 mg | ORAL_TABLET | Freq: Two times a day (BID) | ORAL | Status: DC
Start: 2024-08-25 — End: 2024-08-26
  Administered 2024-08-25: 50 mg via ORAL
  Filled 2024-08-25 (×2): qty 2

## 2024-08-25 MED ORDER — ONDANSETRON HCL 4 MG/2ML IJ SOLN: 4.0000 mg | Freq: Four times a day (QID) | INTRAMUSCULAR | Status: DC | PRN

## 2024-08-25 MED ORDER — OMEPRAZOLE MAGNESIUM 20 MG PO TBEC: 20.0000 mg | DELAYED_RELEASE_TABLET | Freq: Every day | ORAL | Status: DC

## 2024-08-25 MED ORDER — LIDOCAINE 2% (20 MG/ML) 5 ML SYRINGE
INTRAMUSCULAR | Status: DC | PRN
  Administered 2024-08-25: 40 mg via INTRAVENOUS

## 2024-08-25 MED ORDER — ALBUTEROL SULFATE (2.5 MG/3ML) 0.083% IN NEBU: 3.0000 mL | INHALATION_SOLUTION | RESPIRATORY_TRACT | Status: DC | PRN

## 2024-08-25 MED ORDER — FENTANYL CITRATE (PF) 100 MCG/2ML IJ SOLN
25.0000 ug | INTRAMUSCULAR | Status: DC | PRN
  Administered 2024-08-25 (×2): 25 ug via INTRAVENOUS

## 2024-08-25 SURGICAL SUPPLY — 34 items
BAG COUNTER SPONGE SURGICOUNT (BAG) ×2 IMPLANT
BINDER BREAST LRG (GAUZE/BANDAGES/DRESSINGS) IMPLANT
BINDER BREAST XLRG (GAUZE/BANDAGES/DRESSINGS) IMPLANT
CANISTER SUCTION 3000ML PPV (SUCTIONS) ×2 IMPLANT
CHLORAPREP W/TINT 26 (MISCELLANEOUS) ×2 IMPLANT
CLIP APPLIE 9.375 MED OPEN (MISCELLANEOUS) IMPLANT
CLIP TI MEDIUM 6 (CLIP) IMPLANT
COVER PROBE W GEL 5X96 (DRAPES) ×2 IMPLANT
COVER SURGICAL LIGHT HANDLE (MISCELLANEOUS) ×2 IMPLANT
DERMABOND ADVANCED .7 DNX12 (GAUZE/BANDAGES/DRESSINGS) ×2 IMPLANT
DEVICE DUBIN SPECIMEN MAMMOGRA (MISCELLANEOUS) ×2 IMPLANT
DRAPE CHEST BREAST 15X10 FENES (DRAPES) ×2 IMPLANT
ELECT COATED BLADE 2.86 ST (ELECTRODE) ×2 IMPLANT
ELECTRODE REM PT RTRN 9FT ADLT (ELECTROSURGICAL) ×2 IMPLANT
GLOVE BIO SURGEON STRL SZ7 (GLOVE) ×4 IMPLANT
GLOVE BIOGEL PI IND STRL 7.5 (GLOVE) ×2 IMPLANT
GOWN STRL REUS W/ TWL LRG LVL3 (GOWN DISPOSABLE) ×4 IMPLANT
KIT BASIN OR (CUSTOM PROCEDURE TRAY) ×2 IMPLANT
KIT MARKER MARGIN INK (KITS) ×2 IMPLANT
LIGHT WAVEGUIDE WIDE FLAT (MISCELLANEOUS) IMPLANT
NDL HYPO 25GX1X1/2 BEV (NEEDLE) ×2 IMPLANT
NEEDLE HYPO 25GX1X1/2 BEV (NEEDLE) ×1 IMPLANT
PACK GENERAL/GYN (CUSTOM PROCEDURE TRAY) ×2 IMPLANT
SOLN 0.9% NACL 1000 ML (IV SOLUTION) ×1 IMPLANT
SOLN 0.9% NACL POUR BTL 1000ML (IV SOLUTION) ×2 IMPLANT
STRIP CLOSURE SKIN 1/2X4 (GAUZE/BANDAGES/DRESSINGS) ×2 IMPLANT
SUT MNCRL AB 4-0 PS2 18 (SUTURE) ×2 IMPLANT
SUT MON AB 5-0 PS2 18 (SUTURE) IMPLANT
SUT SILK 2 0 SH (SUTURE) IMPLANT
SUT VIC AB 2-0 SH 27XBRD (SUTURE) ×2 IMPLANT
SUT VIC AB 3-0 SH 27X BRD (SUTURE) ×2 IMPLANT
SYR CONTROL 10ML LL (SYRINGE) ×2 IMPLANT
TOWEL GREEN STERILE (TOWEL DISPOSABLE) ×2 IMPLANT
TOWEL GREEN STERILE FF (TOWEL DISPOSABLE) ×2 IMPLANT

## 2024-08-25 NOTE — Progress Notes (Signed)
 Tech made aware to do EKG

## 2024-08-25 NOTE — Transfer of Care (Signed)
 Immediate Anesthesia Transfer of Care Note  Patient: Tabitha Murphy  Procedure(s) Performed: RIGHT BREAST SEED GUIDED LUMPECTOMY WITH MAGNETIC MARKER LOCALIZATION (Right: Breast)  Patient Location: PACU  Anesthesia Type:General  Level of Consciousness: drowsy and patient cooperative  Airway & Oxygen Therapy: Patient connected to face mask oxygen  Post-op Assessment: Report given to RN and Post -op Vital signs reviewed and stable  Post vital signs: Reviewed and stable  Last Vitals:  Vitals Value Taken Time  BP 143/57 08/25/24 14:31  Temp    Pulse 64   Resp 16 08/25/24 14:33  SpO2 100%   Vitals shown include unfiled device data.  Last Pain:  Vitals:   08/25/24 1126  TempSrc:   PainSc: 0-No pain      Patients Stated Pain Goal: 0 (08/25/24 1046)  Complications: There were no known notable events for this encounter.

## 2024-08-25 NOTE — Interval H&P Note (Signed)
 History and Physical Interval Note:  08/25/2024 1:01 PM  Tabitha Murphy  has presented today for surgery, with the diagnosis of RIGHT BREAST CANCER.  The various methods of treatment have been discussed with the patient and family. After consideration of risks, benefits and other options for treatment, the patient has consented to  Procedure(s): RIGHT BREAST SEED GUIDED LUMPECTOMY WITH MAGNETIC MARKER LOCALIZATION (Right) as a surgical intervention.  The patient's history has been reviewed, patient examined, no change in status, stable for surgery.  I have reviewed the patient's chart and labs.  Questions were answered to the patient's satisfaction.     Donnice Bury

## 2024-08-25 NOTE — Anesthesia Procedure Notes (Signed)
 Procedure Name: LMA Insertion Date/Time: 08/25/2024 1:43 PM  Performed by: Christopher Comings, CRNAPre-anesthesia Checklist: Patient identified, Emergency Drugs available, Suction available and Patient being monitored Patient Re-evaluated:Patient Re-evaluated prior to induction Oxygen Delivery Method: Circle system utilized Preoxygenation: Pre-oxygenation with 100% oxygen Induction Type: IV induction Ventilation: Mask ventilation without difficulty LMA: LMA inserted LMA Size: 4.0 Number of attempts: 1 Placement Confirmation: positive ETCO2 and breath sounds checked- equal and bilateral Tube secured with: Tape Dental Injury: Teeth and Oropharynx as per pre-operative assessment

## 2024-08-25 NOTE — Progress Notes (Signed)
 Assisted pt up to the bathroom, placed SCDs on pt and instructed on IS.  Daughter at bedside and will stay the night. Placed ice pak on R breast area. Breast Cancer Bag given to pt and explained.

## 2024-08-25 NOTE — TOC CM/SW Note (Signed)
 Transition of Care [facility]) - Inpatient Brief Assessment   Patient Details  Name: Tabitha Murphy MRN: [id number] Date of Birth: [birth year]  [facility] CM/SW Contact:    Lauraine FORBES Saa, LCSWA Phone Number: 08/25/2024, 4:22 PM   Clinical Narrative:  4:22 PM Per chart review, patient resides at home with child(ren). Patient has a PCP and insurance. Patient does not have SNF or HH history. Patient has DME (RW) history with [employer]. Patient's preferred pharmacy's are [facility] [address], [facility] [employer] [facility], [facility] [employer], and [facility]. No TOC needs identified at this time. TOC will continue to follow and be available to assist.  Transition of Care Asessment: Insurance and Status: Insurance coverage has been reviewed Patient has primary care physician: Yes Home environment has been reviewed: Private Residence Prior level of function:: N/A Prior/Current Home Services: No current home services Social Drivers of Health Review: SDOH reviewed no interventions necessary Readmission risk has been reviewed: Yes (Currently Observation Status) Transition of care needs: no transition of care needs at this time

## 2024-08-25 NOTE — Discharge Instructions (Signed)
 [facility] Office Phone Number [phone]  POST OP INSTRUCTIONS Take 400 mg of ibuprofen every 8 hours or 650 mg tylenol  every 6 hours for next 72 hours then as needed. Use ice several times daily also.  A prescription for pain medication may be given to you upon discharge.  Take your pain medication as prescribed, if needed.  If narcotic pain medicine is not needed, then you may take acetaminophen  (Tylenol ), naprosyn (Alleve) or ibuprofen (Advil) as needed. Take your usually prescribed medications unless otherwise directed If you need a refill on your pain medication, please contact your pharmacy.  They will contact our office to request authorization.  Prescriptions will not be filled after 5pm or on week-ends. You should eat very light the first 24 hours after surgery, such as soup, crackers, pudding, etc.  Resume your normal diet the day after surgery. Most patients will experience some swelling and bruising in the breast.  Ice packs and a good support bra will help.  Wear the breast binder provided or a sports bra for 72 hours day and night.  After that wear a sports bra during the day until you return to the office. Swelling and bruising can take several days to resolve.  It is common to experience some constipation if taking pain medication after surgery.  Increasing fluid intake and taking a stool softener will usually help or prevent this problem from occurring.  A mild laxative (Milk of Magnesia or Miralax) should be taken according to package directions if there are no bowel movements after 48 hours. I used skin glue on the incision, you may shower in 24 hours.  The glue will flake off over the next 2-3 weeks as will the steristrips.   Any sutures or staples will be removed at the office during your follow-up visit. ACTIVITIES:  You may resume regular daily activities (gradually increasing) beginning the next day.  Wearing a good support bra or sports bra minimizes pain and  swelling.  You may have sexual intercourse when it is comfortable. You may drive when you no longer are taking prescription pain medication, you can comfortably wear a seatbelt, and you can safely maneuver your car and apply brakes. RETURN TO WORK:  ______________________________________________________________________________________ Tabitha Murphy should see your doctor in the office for a follow-up appointment approximately two to three weeks after your surgery.  Your doctor's nurse will typically make your follow-up appointment when she calls you with your pathology report.  Expect your pathology report 3-4 business days after your surgery.  You may call to check if you do not hear from us  after three days.  WHEN TO CALL DR Taniela Feltus: Fever over 101.0 Nausea and/or vomiting. Extreme swelling or bruising. Continued bleeding from incision. Increased pain, redness, or drainage from the incision.  The clinic staff is available to answer your questions during regular business hours.  Please don't hesitate to call and ask to speak to one of the nurses for clinical concerns.  If you have a medical emergency, go to the nearest emergency room or call 911.  A surgeon from [facility] is always on call at the hospital.  For further questions, please visit [email or web address]

## 2024-08-25 NOTE — H&P (Signed)
 70 yof with screening mm that showed a right uoq mass. She has no fh. She has a density breast tissue. She has mass ruoq. This is 4x8x4 mm mass. Negative axillary ultrasound. Biopsy is grade II IDC that is 100% er pos, 90% pr pos, her 2 negative and Ki is 10%.  She is due to have hip replacement end of month.she is here with her daughter  Review of Systems: A complete review of systems was obtained from the patient. I have reviewed this information and discussed as appropriate with the patient. See HPI as well for other ROS.  Review of Systems  All other systems reviewed and are negative.  Medical History: Past Medical History:  Diagnosis Date  Anemia  Arrhythmia  Arthritis  Asthma, unspecified asthma severity, unspecified whether complicated, unspecified whether persistent (HHS-HCC)  Hyperlipidemia  Hypertension  Sleep apnea  Thyroid  disease   Patient Active Problem List  Diagnosis  Malignant neoplasm of upper-inner quadrant of right breast in female, estrogen receptor positive (CMS/HHS-HCC)  Primary osteoarthritis of left hip  Persistent atrial fibrillation (CMS/HHS-HCC)  Essential hypertension  CAD (coronary artery disease), native coronary artery   Past Surgical History:  Procedure Laterality Date  CARDIOVERSION EXTERNAL N/A 02/29/2024  JOINT REPLACEMENT Left 04/20/2024  CHOLECYSTECTOMY N/A   Allergies  Allergen Reactions  Doxazosin Mesylate Shortness Of Breath  Other reaction(s): SOB  Codeine Other (See Comments)  REACTION: nausea  Meclizine Other (See Comments)  Oxycodone Hcl Other (See Comments)  Can take with promethazine   Amlodipine Besylate Other (See Comments)  Other reaction(s): swelling along with aches and pains  Duloxetine  Diarrhea  Other reaction(s): Heaviness and pains in chest and back/dry mouth  Gabapentin Swelling  Other reaction(s): causes aches and pains Knee swelling  Pregabalin  Other (See Comments)  Made pain in legs and feet worse.   Rosuvastatin  Calcium  Other (See Comments)  Other reaction(s): knee and leg pains Patient taking 5 mg with not problem  Zolpidem Other (See Comments)  Other reaction(s): retrograde amnesia   Current Outpatient Medications on File Prior to Visit  Medication Sig Dispense Refill  atenoloL  (TENORMIN ) 100 MG tablet Take 50 mg by mouth 2 (two) times daily  ELIQUIS  5 mg tablet Take 5 mg by mouth 2 (two) times daily  hydroCHLOROthiazide  (MICROZIDE ) 12.5 mg capsule Take 12.5 mg by mouth once daily  levothyroxine  (SYNTHROID ) 25 MCG tablet Take 25 mcg by mouth every morning before breakfast (0630) ON AN EMPTY STOMACH  ALPRAZolam  (XANAX ) 0.5 MG tablet Take 0.5 mg by mouth once daily as needed for Anxiety  omeprazole  (PRILOSEC  OTC) 20 MG EC tablet Take 10 mg by mouth every morning before breakfast  traZODone  (DESYREL ) 50 MG tablet Take 50 mg by mouth at bedtime as needed for Sleep   Family History  Problem Relation Age of Onset  Obesity Sister  High blood pressure (Hypertension) Sister  Hyperlipidemia (Elevated cholesterol) Sister  Coronary Artery Disease (Blocked arteries around heart) Sister  Diabetes Sister  Stroke Brother  Coronary Artery Disease (Blocked arteries around heart) Brother  Colon cancer Brother    Social History   Tobacco Use  Smoking Status Never  Smokeless Tobacco Never  Marital status: Widowed  Tobacco Use  Smoking status: Never  Smokeless tobacco: Never  Vaping Use  Vaping status: Never Used    Objective:   Physical Exam Vitals reviewed.  Constitutional:  Appearance: Normal appearance.  Chest:  Breasts: Right: No inverted nipple, mass or nipple discharge.  Left: No inverted nipple, mass or nipple discharge.  Lymphadenopathy:  Upper Body:  Right upper body: No supraclavicular or axillary adenopathy.  Left upper body: No supraclavicular or axillary adenopathy.  Neurological:  Mental Status: She is alert.    Assessment and Plan:   Malignant neoplasm  of upper-inner quadrant of right breast in female, estrogen receptor positive (CMS/HHS-HCC)  Right breast seed guided lumpectomy  We discussed the staging and pathophysiology of breast cancer. We discussed all of the different options for treatment for breast cancer including surgery, chemotherapy, radiation therapy, and antiestrogen therapy.  We discussed a sentinel lymph node biopsy. I do not think she needs one based on data.   We discussed the options for treatment of the breast cancer which included lumpectomy versus a mastectomy. We discussed the performance of the lumpectomy with radioactive seed placement. We discussed a 5% chance of a positive margin requiring reexcision in the operating room. We also discussed that she might have radiation therapy if she undergoes lumpectomy. We discussed mastectomy and the postoperative care for that as well. Mastectomy can be followed by reconstruction. Most mastectomy patients will not need radiation therapy. We discussed that there is no difference in her survival whether she undergoes lumpectomy with radiation therapy or antiestrogen therapy versus a mastectomy. There is also no real difference between her recurrence in the breast.  We discussed the risks of operation including bleeding, infection, possible reoperation. She understands her further therapy will be based on what her stages at the time of her operatio

## 2024-08-25 NOTE — Progress Notes (Addendum)
 Dr. Ebbie and Dr. Tilford made aware of today's BMP result. Potassium 3.2 and Sodium 122. Ok top proceed with surgery per both physicians.

## 2024-08-25 NOTE — Plan of Care (Signed)

## 2024-08-25 NOTE — Progress Notes (Signed)
 Dinner tray ordered for pt

## 2024-08-25 NOTE — Progress Notes (Addendum)
 Dr. Tilford made aware of today's ISTAT result. Potassium 5.3 and Sodium 118.  Dr. Ebbie also made aware.  Verbal order received to collect a BMP-stat.

## 2024-08-25 NOTE — Progress Notes (Signed)
   08/25/24 1635  Assess: MEWS Score  Temp 97.9 F (36.6 C)  BP (!) 147/54  MAP (mmHg) 79  Pulse Rate (!) 115  Resp 18  Level of Consciousness Alert  SpO2 99 %  O2 Device Room Air  Assess: MEWS Score  MEWS Temp 0  MEWS Systolic 0  MEWS Pulse 2  MEWS RR 0  MEWS LOC 0  MEWS Score 2  MEWS Score Color Yellow  Assess: if the MEWS score is Yellow or Red  Were vital signs accurate and taken at a resting state? Yes  Does the patient meet 2 or more of the SIRS criteria? No  MEWS guidelines implemented  Yes, yellow  Treat  MEWS Interventions Considered administering scheduled or prn medications/treatments as ordered  Take Vital Signs  Increase Vital Sign Frequency  Yellow: Q2hr x1, continue Q4hrs until patient remains green for 12hrs  Escalate  MEWS: Escalate Yellow: Discuss with charge nurse and consider notifying provider and/or RRT  Notify: Charge Nurse/RN  Name of Charge Nurse/RN Notified [facility]  Provider Notification  Provider Name/Title Donnice Ebbie COME  Date Provider Notified 08/25/24  Time Provider Notified 1640  Method of Notification Page  Notification Reason Other (Comment) (Yellow MEWS)  Provider response Other (Comment) (EKG)  Date of Provider Response 08/25/24  Time of Provider Response 1641  Assess: SIRS CRITERIA  SIRS Temperature  0  SIRS Respirations  0  SIRS Pulse 1  SIRS WBC 0  SIRS Score Sum  1

## 2024-08-25 NOTE — Op Note (Signed)
 Preoperative diagnosis: Clinical stage I right breast cancer Postoperative diagnosis: Same as above Procedure: Right breast Magseed guided lumpectomy Surgeon: Dr. Adina Bury Anesthesia: General Estimated blood loss: Minimal Specimens: 1.  Right breast tissue marked with paint containing Magseed and open padlock clip 2.  Additional posterior margin marked short superior, long lateral, double deep Complications: None Drains: None Sponge and needle count was correct at completion Disposition recovery stable condition  Indications:83 yof with screening mm that showed a right uoq mass. She has no fh. She has a density breast tissue. She has mass ruoq. This is 4x8x4 mm mass. Negative axillary ultrasound. Biopsy is grade II IDC that is 100% er pos, 90% pr pos, her 2 negative and Ki is 10% we discussed proceeding with lumpectomy alone.  On the day of surgery her initial labs were very abnormal we will recheck these.  She is chronically hyponatremic and her creatinine is up a little bit.  The remainder of her electrolytes were fairly normal so we elected to proceed.  Plan will be admission afterwards with medical evaluation.  Procedure: After informed consent was obtained she was taken to the operating room.  I discussed the seed placement with the radiologist prior to beginning as there was an extra seed in place that we were planning to leave.  She was given antibiotics.  SCDs were placed.  She was placed under general esthesia without complication.  She was prepped and draped in standard sterile surgical fashion.  Surgical timeout was then performed.  I located the [facility] in the upper breast.  I then made a curvilinear incision overlying this after infiltrating lidocaine  throughout this area.  I then used the probe to identify the [facility] and remove this as well as some of the surrounding tissue in order to get a clear margin.  This was then marked with paint.  3D imaging was then done.  This  confirmed that I remove the Magseed in the correct open padlock clip.  The 3D imaging look like I was clear except for possibly my posterior margin so I remove this.  This was marked as above.  The posterior margin is now the muscle.  I then obtained hemostasis.  I mobilized the tissue and brought this together with 2-0 Vicryl.  The skin was closed with 3-0 Vicryl and 4-0 Monocryl.  Glue and Steri-Strips were applied.  She tolerated this well was extubated transferred recovery stable.

## 2024-08-25 NOTE — Progress Notes (Signed)
 Pt arrived to 6 north room 24 alert and oriented x4. Pain level6/10 in breast. Stri-strips on right breast clean dry and intact. Compression  bra on. Bed in lowest position. Call light in reach. All needs met at this time.

## 2024-08-25 NOTE — H&P (Signed)
 [facility] consult   Tabitha Murphy FMW:[id number] DOB: Aug 24, 1941 DOA: 08/25/2024 From:home  Code StatusFull  PCP: Rolinda Millman, MD   Chief Complaint:   Medical management  HPI:  13 yr WF Perm Afib >4 since 02/17/2021 admission HFrEF in 2022 with improvement to 55-60% 08/2022 Chronic dysphagia followed by Dr. Herschell with negative endoscopy 03/2023 Previous paresthesias Is followed by Dr. Cindie and maintained on Eliquis  for stroke prevention-saw him 07/14/2024 for preop eval Had a biopsy early September found to have invasive ductal carcinoma estrogen progesterone receptor positive H ER 2 receptor negative [id number] proliferative index 10% Underwent breast conserving therapy earlier this morning  Patient has been told previously by Dr. Cloria her former physician who has since retired that she has low sodium and not to drink >32 ounces of water  given her sodium tends to be down  She presented as above for the breast-conserving therapy-was found on i-STAT lab to have a sodium of 118 her baseline over the past week has been in the 120 range at baseline she has between 130 and 135  Repeat lab was done showing sodium 122 potassium 3.2 BUN/creatinine 13/1.2 with a baseline of 8/0.8  Hemoglobin was 11.9  She tells me she has been feeling fair overall she has no specific complaints she is a little bit hard of hearing she has occasional dark stool but she attributes to iron  she has lost some weight recently She has some chronic chest discomfort  Myra daughter---tells me she has been on limited water  intake for 4-5 years under the care of of her primary care physician unaware of whether SIADH was reported in the past  Hospitalist was asked to consult to assist with medical management  Review of Systems:  As mentioned above in HPI are pertinent +'s Pertinent negatives as per below  Past Medical History:  Diagnosis Date   Anxiety    Arthritis    Benign essential HTN    CAD (coronary  artery disease), native coronary artery    Cath 2012 with 30% LCx; Coronary CTA showed minimal mixed nonobstructive disease.  Less than 25% ostial LAD and left circumflex with coronary calcium  score 41.7 on  12/2022   Complication of anesthesia    slow to wake up   DCM (dilated cardiomyopathy) (HCC)    ? tachy mediated from afib with RVR.  EF 45-50% by echo 01/2021.  Lexiscan  myoview  with no ischemia   Diverticulosis    Dyspnea    Family history of colon cancer    Family history of pancreatic cancer    Family history of uterine cancer    Heart murmur    Hiatal hernia    HLD (hyperlipidemia)    Persistent atrial fibrillation (HCC)    On DOAC   PONV (postoperative nausea and vomiting)    Pre-diabetes    S/P laparoscopic cholecystectomy 07/28/2011   23 years ago   Sleep apnea    cpap   Thyroid  goiter    Past Surgical History:  Procedure Laterality Date   BALLOON DILATION N/A 03/24/2023   Procedure: BALLOON DILATION;  Surgeon: Saintclair Jasper, MD;  Location: [facility];  Service: Gastroenterology;  Laterality: N/A;   CARDIOVERSION N/A 02/29/2024   Procedure: CARDIOVERSION;  Surgeon: Barbaraann Darryle Ned, MD;  Location: [facility];  Service: Cardiovascular;  Laterality: N/A;   child birth     x3   CHOLECYSTECTOMY     ESOPHAGEAL MANOMETRY N/A 07/22/2023   Procedure: ESOPHAGEAL MANOMETRY (EM);  Surgeon:  Dianna Specking, MD;  Location: [facility];  Service: Gastroenterology;  Laterality: N/A;   ESOPHAGOGASTRODUODENOSCOPY (EGD) WITH PROPOFOL  N/A 03/24/2023   Procedure: ESOPHAGOGASTRODUODENOSCOPY (EGD) WITH PROPOFOL ;  Surgeon: Saintclair Jasper, MD;  Location: [facility];  Service: Gastroenterology;  Laterality: N/A;   TOTAL HIP ARTHROPLASTY Left 04/20/2024   Procedure: ARTHROPLASTY, HIP, TOTAL, ANTERIOR APPROACH;  Surgeon: Melodi Lerner, MD;  Location: [facility];  Service: Orthopedics;  Laterality: Left;    reports that she has never smoked. She has never used smokeless tobacco. She  reports that she does not drink alcohol  and does not use drugs.  Mobility: Relatively independent  Allergies  Allergen Reactions   Doxazosin Mesylate Shortness Of Breath    Other reaction(s): SOB   Meclizine Other (See Comments)   Oxycodone Hcl Other (See Comments)    Can take with promethazine     Zolpidem Other (See Comments)   Amlodipine Besylate     Other reaction(s): swelling along with aches and pains   Codeine     REACTION: nausea   Crest Tartar Control     Other reaction(s): skin around mouth comes off   Cymbalta  [Duloxetine  Hcl]     Other reaction(s): Heaviness and pains in chest and back/dry mouth   Duloxetine  Diarrhea   Gabapentin Swelling    Other reaction(s): causes aches and pains Knee swelling   Lyrica  [Pregabalin ] Other (See Comments)    Made pain in legs and feet worse.   Rosuvastatin  Calcium  Other (See Comments)    Other reaction(s): knee and leg pains Patient taking 5 mg with not problem   Zolpidem Tartrate     Other reaction(s): retrograde amnesia   Family History  Problem Relation Age of Onset   Uterine cancer Mother 66   Heart attack Mother    Heart attack Father        Died in early 59s with MI   CAD Sister    Diabetes Sister    Hypertension Sister    Diabetes Sister    Hypertension Sister    Hypertension Sister    Heart attack Sister 37   Hypertension Sister    Heart disease Brother    Heart attack Brother    Heart attack Brother    Heart attack Brother    Heart attack Brother 4   Colon cancer Brother 15   Pancreatic cancer Maternal Aunt        dx. in her 63s   CVA Maternal Grandmother    CVA Maternal Grandfather    Diabetes Paternal Grandmother    Prior to Admission medications   Medication Sig Start Date End Date Taking? Authorizing Provider  ALPRAZolam  (XANAX ) 0.5 MG tablet Take 0.5 mg by mouth daily as needed for anxiety or sleep.   Yes [provider]  Ascorbic Acid (VITAMIN C PO) Take 1 tablet by mouth daily.   Yes  [provider]  atenolol  (TENORMIN ) 100 MG tablet TAKE 1/2 TABLET BY MOUTH TWICE A DAY 08/22/24  Yes Turner, Traci R, MD  B Complex-C (SUPER B COMPLEX PO) Take 1 tablet by mouth daily.   Yes [provider]  Biotin 89999 MCG TBDP Take 10,000 mcg by mouth daily.   Yes [provider]  CALCIUM -VITAMIN D PO Take 1 tablet by mouth daily.   Yes [provider]  hydrochlorothiazide  (MICROZIDE ) 12.5 MG capsule TAKE 1 CAPSULE BY MOUTH EVERY DAY 06/25/23  Yes Turner, Traci R, MD  irbesartan  (AVAPRO ) 300 MG tablet Take 300 mg by mouth every morning.  02/02/21  Yes [provider]  levothyroxine  (SYNTHROID ) 25 MCG tablet Take 25 mcg by mouth daily before breakfast. 02/25/21  Yes [provider]  METAMUCIL FIBER PO Take 3 each by mouth daily. Chewables   Yes [provider]  Multiple Vitamin (MULTIVITAMIN) tablet Take 1 tablet by mouth daily.  Centrum   Yes [provider]  omeprazole  (PRILOSEC  OTC) 20 MG tablet Take 10 mg by mouth daily before breakfast.   Yes [provider]  ondansetron  (ZOFRAN ) 4 MG tablet Take 4 mg by mouth daily as needed for nausea or vomiting.   Yes [provider]  Polyethyl Glycol-Propyl Glycol (SYSTANE) 0.4-0.3 % SOLN Place 1 drop into both eyes 3 (three) times daily as needed (dry/irritated eyes.).   Yes [provider]  promethazine  (PHENERGAN ) 25 MG tablet Take 25 mg by mouth every 6 (six) hours as needed for nausea or vomiting.   Yes [provider]  rosuvastatin  (CRESTOR ) 5 MG tablet TAKE 1 TABLET (5 MG TOTAL) BY MOUTH DAILY. 02/20/23  Yes Turner, Wilbert SAUNDERS, MD  scopolamine (TRANSDERM-SCOP) 1 MG/3DAYS Place 1 patch onto the skin every 3 (three) days as needed (nausea).   Yes [provider]  Selenium 100 MCG TABS Take 100 mcg by mouth daily.   Yes [provider]  traZODone  (DESYREL ) 50 MG tablet Take 50 mg by mouth at bedtime as needed for sleep.   Yes [provider]  albuterol  (VENTOLIN  HFA) 108 (90 Base) MCG/ACT inhaler Inhale 2 puffs into the lungs every 4 (four) hours as needed for wheezing or shortness of breath. 03/18/20   [provider]  ELIQUIS  5 MG TABS tablet TAKE 1 TABLET BY MOUTH TWICE A DAY 03/04/23   Shlomo Wilbert SAUNDERS, MD  lidocaine -prilocaine  (EMLA ) cream Apply 1 Application topically 2 (two) times daily as needed (feet burning/pain.). 09/24/23   [provider]  meclizine (ANTIVERT) 25 MG tablet Take 25 mg by mouth every 12 (twelve) hours as needed for dizziness.    [provider]  valACYclovir (VALTREX) 1000 MG tablet Take 2,000 mg by mouth 2 (two) times daily as needed (fever blisters).    [provider]    Physical Exam:  Vitals:   08/25/24 1033 08/25/24 1430  BP: (!) 110/51 (!) 143/57  Pulse: (!) 52 65  Resp: 18 11  Temp: 97.8 F (36.6 C) 97.7 F (36.5 C)  SpO2: 98% 100%    Awake coherent has a bump in the middle of her forehead no icterus no pallor quite coherent but very hard of hearing S1-S2 no murmur seems to be regular Chest is clear no wheeze rales rhonchi did not disturb breast area Abdomen soft no rebound No JVD No lower extremity edema Euthymic somewhat coherent again very hard of hearing  I have personally reviewed following labs and imaging studies  Labs:  As above Calculated osmolality is 254  Principal Problem:   Status post left breast lumpectomy   Assessment/Plan Asymptomatic hyponatremia Plasma osmolality is less than 280 signifying may be excessive free water  to sodium although I do not appreciate specific overload It would be important to get urine osmole's and urine studies I will add on TSH and ACTH hypovolemic hyponatremia workup She probably has a mixed picture because she is on a thiazide diuretic that would cause hypovolemic hypotonic hyponatremia interfering with her salt excretion l Given historically it sounds like SIADH we will restrict  fluids to 1200 cc, check sodium every 8 and if  this stabilizes we will probably let her go home I think it is reasonable to give a trial of sodium chloride  IV fluid 40 cc/H for 6 hours if the sodium does not come up with the next labs I would discontinue it and start salt tablets based on urine studies and I will sign this out to my partner I do anticipate if she looks good and her sodium is above 125 or 126 she probably can go home tomorrow  Permanent A-fib CHADVASC >4 HF M EF with improvement 55-60% Hold Eliquis  until as per surgery input--continue atenolol  50 twice daily  HTN HLD continue Crestor  5  holding HCTZ as above  May need aquaresis with Lasix  we will decide that based on next labs   Hypothyroidism see above discussion Continue Synthroid  25  Recent diagnosis of breast cancer Defer to outpatient specialists and Dr. Ebbie who just perform surgery-pain control as per them  Chronic dysphagia Follow-up with Dr. Herschell in the outpatient-has bearing on how much solid she can taken-should be reminded on discharge to take solids without too much free water  such as Ensure etc.  Severity of Illness: The appropriate patient status for this patient is OBSERVATION. Observation status is judged to be reasonable and necessary in order to provide the required intensity of service to ensure the patient's safety. The patient's presenting symptoms, physical exam findings, and initial radiographic and laboratory data in the context of their medical condition is felt to place them at decreased risk for further clinical deterioration. Furthermore, it is anticipated that the patient will be medically stable for discharge from the hospital within 2 midnights of admission.    Family Communication: None present Called daughter Harland [phone]  DVT ppx: No Consults called & Whom: Not yet  Time spent: 70 minutes  Royal, MD [days-call my NP partners at night for Care related issues] [facility] --[facility] [employer] OR , [email or web address]; password [facility]  08/25/2024, 2:44 PM

## 2024-08-26 DIAGNOSIS — Z9889 Other specified postprocedural states: Secondary | ICD-10-CM | POA: Diagnosis not present

## 2024-08-26 DIAGNOSIS — C50211 Malignant neoplasm of upper-inner quadrant of right female breast: Secondary | ICD-10-CM | POA: Diagnosis not present

## 2024-08-26 LAB — URINALYSIS, ROUTINE W REFLEX MICROSCOPIC
Bacteria, UA: NONE SEEN
Bilirubin Urine: NEGATIVE
Glucose, UA: NEGATIVE mg/dL
Hgb urine dipstick: NEGATIVE
Ketones, ur: NEGATIVE mg/dL
Nitrite: NEGATIVE
Protein, ur: NEGATIVE mg/dL
Specific Gravity, Urine: 1.008 (ref 1.005–1.030)
pH: 6 (ref 5.0–8.0)

## 2024-08-26 LAB — BASIC METABOLIC PANEL WITH GFR
Anion gap: 12 (ref 5–15)
Anion gap: 10 (ref 5–15)
BUN: 13 mg/dL (ref 8–23)
BUN: 11 mg/dL (ref 8–23)
CO2: 23 mmol/L (ref 22–32)
CO2: 26 mmol/L (ref 22–32)
Calcium: 8.7 mg/dL — ABNORMAL LOW (ref 8.9–10.3)
Calcium: 9 mg/dL (ref 8.9–10.3)
Chloride: 87 mmol/L — ABNORMAL LOW (ref 98–111)
Chloride: 89 mmol/L — ABNORMAL LOW (ref 98–111)
Creatinine, Ser: 1.07 mg/dL — ABNORMAL HIGH (ref 0.44–1.00)
Creatinine, Ser: 1.05 mg/dL — ABNORMAL HIGH (ref 0.44–1.00)
GFR, Estimated: 52 mL/min — ABNORMAL LOW (ref >60)
GFR, Estimated: 53 mL/min — ABNORMAL LOW (ref >60)
Glucose, Bld: 111 mg/dL — ABNORMAL HIGH (ref 70–99)
Glucose, Bld: 127 mg/dL — ABNORMAL HIGH (ref 70–99)
Potassium: 3.5 mmol/L (ref 3.5–5.1)
Potassium: 4.2 mmol/L (ref 3.5–5.1)
Sodium: 122 mmol/L — ABNORMAL LOW (ref 135–145)
Sodium: 125 mmol/L — ABNORMAL LOW (ref 135–145)

## 2024-08-26 LAB — SODIUM, URINE, RANDOM: Sodium, Ur: 39 mmol/L

## 2024-08-26 LAB — OSMOLALITY, URINE: Osmolality, Ur: 204 mosm/kg — ABNORMAL LOW (ref 300–900)

## 2024-08-26 LAB — CORTISOL-AM, BLOOD: Cortisol - AM: 11.1 ug/dL (ref 6.7–22.6)

## 2024-08-26 MED ORDER — FUROSEMIDE 40 MG PO TABS: 40.0000 mg | ORAL_TABLET | Freq: Every day | ORAL | Status: DC

## 2024-08-26 MED ORDER — FUROSEMIDE 40 MG PO TABS
40.0000 mg | ORAL_TABLET | Freq: Every day | ORAL | Status: DC
  Administered 2024-08-26: 40 mg via ORAL
  Filled 2024-08-26: qty 1

## 2024-08-26 NOTE — Anesthesia Postprocedure Evaluation (Signed)
 Anesthesia Post Note  Patient: AIRIANA ELMAN  Procedure(s) Performed: RIGHT BREAST SEED GUIDED LUMPECTOMY WITH MAGNETIC MARKER LOCALIZATION (Right: Breast)     Patient location during evaluation: PACU Anesthesia Type: General Level of consciousness: awake and alert Pain management: pain level controlled Vital Signs Assessment: post-procedure vital signs reviewed and stable Respiratory status: spontaneous breathing, nonlabored ventilation, respiratory function stable and patient connected to nasal cannula oxygen Cardiovascular status: blood pressure returned to baseline and stable Postop Assessment: no apparent nausea or vomiting Anesthetic complications: no   There were no known notable events for this encounter.  Last Vitals:  Vitals:   08/25/24 2118 08/26/24 0517  BP: (!) 113/54 93/72  Pulse: 65 (!) 53  Resp: 17 17  Temp: (!) 36.4 C 36.7 C  SpO2: 99% 98%    Last Pain:  Vitals:   08/26/24 0517  TempSrc: Oral  PainSc:    Hospitalist to be consulted for lab abnormalities.              Shaye Elling D Armanda Forand

## 2024-08-26 NOTE — Progress Notes (Signed)
 Mobility Specialist Progress Note:    08/26/24 1208  Mobility  Activity Ambulated with assistance (In room)  Level of Assistance Contact guard assist, steadying assist  Assistive Device Front wheel walker  Distance Ambulated (ft) 40 ft  Activity Response Tolerated well  Mobility Referral Yes  Mobility visit 1 Mobility  Mobility Specialist Start Time (ACUTE ONLY) [id number]  Mobility Specialist Stop Time (ACUTE ONLY) 1009  Mobility Specialist Time Calculation (min) (ACUTE ONLY) 13 min   Received pt in bed and agreeable to mobility. Pt required MinG d/t safety. No c/o. Pt returned to bed without fault. Bed alarm on. Personal belongings and call light within reach. All needs met.  Lavanda Pollack Mobility Specialist  Please contact via [employer] or  Rehab Office [phone]

## 2024-08-26 NOTE — Progress Notes (Signed)
 1 Day Post-Op   Subjective/Chief Complaint: Doing fine, appreciate Dr Royal seeing her   Objective: Vital signs in last 24 hours: Temp:  [97 F (36.1 C)-98 F (36.7 C)] 98 F (36.7 C) (09/26 0517) Pulse Rate:  [52-115] 58 (09/26 0754) Resp:  [11-18] 17 (09/26 0754) BP: (93-166)/(48-74) 109/48 (09/26 0754) SpO2:  [98 %-100 %] 100 % (09/26 0754) Weight:  [71.7 kg] 71.7 kg (09/25 1033) Last BM Date : 08/25/24  Intake/Output from previous day: 09/25 0701 - 09/26 0700 In: 800 [I.V.:800] Out: 10 [Blood:10] Intake/Output this shift: No intake/output data recorded.  Bra in place  Lab Results:  Recent Labs    08/25/24 1116  HGB 11.9*  HCT 35.0*   BMET Recent Labs    08/25/24 1734 08/26/24 0457  NA 124* 122*  K 3.5 3.5  CL 85* 87*  CO2 22 23  GLUCOSE 109* 111*  BUN 11 13  CREATININE 1.07* 1.07*  CALCIUM  9.0 8.7*   PT/INR No results for input(s): LABPROT, INR in the last 72 hours. ABG No results for input(s): PHART, HCO3 in the last 72 hours.  Invalid input(s): PCO2, PO2  Studies/Results: No results found.  Anti-infectives: Anti-infectives (From admission, onward)    Start     Dose/Rate Route Frequency Ordered Stop   08/25/24 1030  ceFAZolin  (ANCEF ) IVPB 2g/100 mL premix        2 g 200 mL/hr over 30 Minutes Intravenous On call to O.R. 08/25/24 1023 08/25/24 1348       Assessment/Plan: Doing fine surgically Restart eliquis  If cleared by [facility] will send home later   Tabitha Murphy 08/26/2024

## 2024-08-26 NOTE — Plan of Care (Signed)
   Problem: Education: Goal: Knowledge of General Education information will improve Description Including pain rating scale, medication(s)/side effects and non-pharmacologic comfort measures Outcome: Progressing

## 2024-08-26 NOTE — Discharge Summary (Addendum)
 Physician Discharge Summary  Tabitha Murphy FMW:[id number] DOB: Dec 17, 1940 DOA: 08/25/2024  PCP: Rolinda Millman, MD  Admit date: 08/25/2024 Discharge date: 08/26/2024  Time spent: 20 minutes  Recommendations for Outpatient Follow-up:  Restrict fluids to about 1500 cc never use HCTZ in her case again Needs outpatient labs Outpatient follow-up of breast biopsy/pathology and coordination via Dr. Ebbie with oncologist  Discharge Diagnoses:  MAIN problem for hospitalization   Asymptomatic hyponatremia Breast biopsy   Please see below for itemized issues addressed in HOpsital- refer to other progress notes for clarity if needed  Discharge Condition: Improved  Diet recommendation: Restrict fluids to 1200 cc get labs in a week  Filed Weights   08/25/24 1033  Weight: 71.7 kg    History of present illness:  83 yr WF Perm Afib >4 since 02/17/2021 admission HFrEF in 2022 with improvement to 55-60% 08/2022 Chronic dysphagia followed by Dr. Herschell with negative endoscopy 03/2023 Previous paresthesias Is followed by Dr. 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 Cloria her former physician who has since retired that she has low sodium and not to drink >32 ounces of water  given her sodium tends to be down   She presented as above for the breast-conserving therapy-was found on i-STAT lab to have a sodium of 118 her baseline over the past week has been in the 120 range at baseline she has between 130 and 135   Repeat lab was done showing sodium 122 potassium 3.2 BUN/creatinine 13/1.2 with a baseline of 8/0.8   Hemoglobin was 11.9   She tells me she has been feeling fair overall she has no specific complaints  she is a little bit hard of hearing she has occasional dark stool but she attributes to iron  she has lost some weight recently She has some chronic chest discomfort   Tabitha Murphy daughter---tells me she has been on limited water  intake for 4-5 years under the care of of her primary care physician unaware of whether SIADH was reported in the past   COURSE She  was observed night for mild hyponatremia without behavioral disturbances her baseline is around 128 She has been told previously by Dr. Cloria her former physician to restrict to 32 ounces Serum osmolality was calculated around 264 urine osmolality was 204 raising concern for tea toast potomania versus this being reset osmostat in the setting of chronic nausea Sodium vacillated between 122 and 124 finally came up to 125 at discharge with fluid restriction TSH and a.m. cortisol were negative ruling out endocrine causes She has been cautioned to stop her HCTZ which can washout sodium from the proximal medulla and has been cautioned also not to drink excessively  The only medication that changed was her HCTZ she can continue her statin atenolol  Synthroid  and should follow-up with her outpatient GI doctor Tabitha Murphy who follows her for chronic dysphagia I am sure that this has some bearing on her presentation and she needs to take more solid and liquids and I will discuss this with the daughter at time of discharge  Discharge Exam: Vitals:   08/26/24 1000 08/26/24 1610  BP: (!) 103/47 (!) 103/48  Pulse:  (!) 56  Resp:  17  Temp:  97.8 F (36.6 C)  SpO2:  100%    Subj on day of d/c   Coherent alert tired of being stuck I feel like a pin cushion   EOMI NCAT no focal deficit no icterus no pallor No JVD S1-S2 no murmur Chest is clear Abdomen soft no rebound No lower extremity edema Euthymic coherent pleasant  Discharge Instructions    Allergies as of 08/26/2024       Reactions   Doxazosin Mesylate Shortness Of Breath   Other  reaction(s): SOB   Meclizine Other (See Comments)   Oxycodone Hcl Other (See Comments)   Can take with promethazine     Zolpidem Other (See Comments)   Amlodipine Besylate    Other reaction(s): swelling along with aches and pains   Codeine    REACTION: nausea   Crest Tartar Control    Other reaction(s): skin around mouth comes off   Cymbalta  [duloxetine  Hcl]    Other reaction(s): Heaviness and pains in chest and back/dry mouth   Duloxetine  Diarrhea   Gabapentin Swelling   Other reaction(s): causes aches and pains Knee swelling   Lyrica  [pregabalin ] Other (See Comments)   Made pain in legs and feet worse.   Rosuvastatin  Calcium  Other (See Comments)   Other reaction(s): knee and leg pains Patient taking 5 mg with not problem   Zolpidem Tartrate    Other reaction(s): retrograde amnesia        Medication List     STOP taking these medications    hydrochlorothiazide  12.5 MG capsule Commonly known as: MICROZIDE        TAKE these medications    albuterol  108 (90 Base) MCG/ACT inhaler Commonly known as: VENTOLIN  HFA Inhale 2 puffs into the lungs every 4 (four) hours as needed for wheezing or shortness of breath.   ALPRAZolam  0.5 MG tablet Commonly known as: XANAX  Take 0.5 mg by mouth daily as needed for anxiety or sleep.   atenolol  100 MG tablet Commonly known as: TENORMIN  TAKE 1/2 TABLET BY MOUTH TWICE A DAY   Biotin 89999 MCG Tbdp Take 10,000 mcg by mouth daily.   CALCIUM -VITAMIN D PO Take 1 tablet by mouth daily.   Eliquis  5 MG Tabs tablet Generic drug: apixaban  TAKE 1 TABLET BY MOUTH TWICE A DAY   irbesartan  300 MG tablet Commonly known as: AVAPRO  Take 300 mg by mouth every morning.   levothyroxine  25 MCG tablet Commonly known as: SYNTHROID  Take 25 mcg by mouth daily before breakfast.   lidocaine -prilocaine  cream Commonly known as: EMLA  Apply 1 Application topically 2 (two) times daily as needed (feet burning/pain.).   meclizine 25 MG  tablet Commonly known as: ANTIVERT Take 25 mg by mouth every 12 (twelve) hours as needed for dizziness.   METAMUCIL FIBER PO Take 3 each by mouth daily. Chewables   multivitamin tablet Take 1 tablet by mouth daily.  Centrum   omeprazole  20 MG tablet Commonly known as: PRILOSEC  OTC Take 10 mg by mouth daily before breakfast.   ondansetron  4 MG tablet Commonly known as: ZOFRAN  Take 4 mg by mouth daily as needed for nausea or vomiting.   promethazine  25 MG tablet Commonly known as: PHENERGAN  Take 25 mg by mouth every 6 (six) hours as needed for nausea or vomiting.   rosuvastatin  5 MG tablet Commonly known as: CRESTOR  TAKE 1 TABLET (5 MG TOTAL) BY MOUTH DAILY.   scopolamine 1 MG/3DAYS Commonly known as: TRANSDERM-SCOP Place 1 patch onto the skin every 3 (three) days as needed (nausea).   Selenium 100 MCG Tabs Take 100  mcg by mouth daily.   SUPER B COMPLEX PO Take 1 tablet by mouth daily.   Systane 0.4-0.3 % Soln Generic drug: Polyethyl Glycol-Propyl Glycol Place 1 drop into both eyes 3 (three) times daily as needed (dry/irritated eyes.).   traZODone  50 MG tablet Commonly known as: DESYREL  Take 50 mg by mouth at bedtime as needed for sleep.   valACYclovir 1000 MG tablet Commonly known as: VALTREX Take 2,000 mg by mouth 2 (two) times daily as needed (fever blisters).   VITAMIN C PO Take 1 tablet by mouth daily.       Allergies  Allergen Reactions   Doxazosin Mesylate Shortness Of Breath    Other reaction(s): SOB   Meclizine Other (See Comments)   Oxycodone Hcl Other (See Comments)    Can take with promethazine     Zolpidem Other (See Comments)   Amlodipine Besylate     Other reaction(s): swelling along with aches and pains   Codeine     REACTION: nausea   Crest Tartar Control     Other reaction(s): skin around mouth comes off   Cymbalta  [Duloxetine  Hcl]     Other reaction(s): Heaviness and pains in chest and back/dry mouth   Duloxetine  Diarrhea    Gabapentin Swelling    Other reaction(s): causes aches and pains Knee swelling   Lyrica  [Pregabalin ] Other (See Comments)    Made pain in legs and feet worse.   Rosuvastatin  Calcium  Other (See Comments)    Other reaction(s): knee and leg pains Patient taking 5 mg with not problem   Zolpidem Tartrate     Other reaction(s): retrograde amnesia    Follow-up Information     Tabitha Murphy Cough, MD Follow up in 3 week(s).   Specialty: General Surgery Contact information: [address] [place] [address] [phone]                  The results of significant diagnostics from this hospitalization (including imaging, microbiology, ancillary and laboratory) are listed below for reference.    Significant Diagnostic Studies: LONG TERM MONITOR (3-14 DAYS) Result Date: 08/10/2024   Predominant rhythm was atrial fibrillation with 100% A-fib burden.  Average heart rate 68 bpm and ranged from 41 to 117 bpm   Isolated PVCs, ventricular couplets with PVC load less than 1% Patch Wear Time:  4 days and 11 hours (2025-08-31T08:20:16-0400 to 2025-09-04T19:53:53-0400) Atrial Fibrillation occurred continuously (100% burden), ranging from 41-117 bpm (avg of 68 bpm). Isolated VEs were rare (<1.0%), VE Couplets were rare (<1.0%), and no VE Triplets were present.    Microbiology: No results found for this or any previous visit (from the past 240 hours).   Labs: Basic Metabolic Panel: Recent Labs  Lab 08/22/24 1100 08/25/24 1116 08/25/24 1210 08/25/24 1734 08/26/24 0457 08/26/24 1534  NA 123* 118* 122* 124* 122* 125*  K 4.1 5.3* 3.2* 3.5 3.5 4.2  CL 80* 88* 86* 85* 87* 89*  CO2 29  --  23 22 23 26   GLUCOSE 116* 141* 104* 109* 111* 127*  BUN 8 17 13 11 13 11   CREATININE 0.83 1.20* 1.24* 1.07* 1.07* 1.05*  CALCIUM  9.9  --  9.0 9.0 8.7* 9.0   Liver Function Tests: No results for input(s): AST, ALT, ALKPHOS, BILITOT, PROT, ALBUMIN in the last 168 hours. No  results for input(s): LIPASE, AMYLASE in the last 168 hours. No results for input(s): AMMONIA in the last 168 hours. CBC: Recent Labs  Lab 08/25/24 1116  HGB 11.9*  HCT 35.0*   Cardiac Enzymes: No results for input(s): CKTOTAL, CKMB, CKMBINDEX, TROPONINI in the last 168 hours. BNP: BNP (last 3 results) Recent Labs    08/28/23 1827  BNP 66.5    ProBNP (last 3 results) No results for input(s): PROBNP in the last 8760 hours.  CBG: No results for input(s): GLUCAP in the last 168 hours.  Signed:  Colen Grimes MD   [facility] 08/26/2024, 4:40 PM

## 2024-08-26 NOTE — Care Management Obs Status (Signed)
 MEDICARE OBSERVATION STATUS NOTIFICATION   Patient Details  Name: Tabitha Murphy MRN: [id number] Date of Birth: February 28, 1941   Medicare Observation Status Notification Given:  Yes    Jon Cruel 08/26/2024, 11:57 AM

## 2024-08-26 NOTE — Plan of Care (Signed)
  Problem: Education: Goal: Knowledge of General Education information will improve Description: Including pain rating scale, medication(s)/side effects and non-pharmacologic comfort measures Outcome: Progressing   Problem: Clinical Measurements: Goal: Diagnostic test results will improve Outcome: Progressing   Problem: Activity: Goal: Risk for activity intolerance will decrease Outcome: Completed/Met   Problem: Nutrition: Goal: Adequate nutrition will be maintained Outcome: Completed/Met   Problem: Coping: Goal: Level of anxiety will decrease Outcome: Completed/Met   Problem: Pain Managment: Goal: General experience of comfort will improve and/or be controlled Outcome: Completed/Met

## 2024-08-26 NOTE — Telephone Encounter (Signed)
 Labs forwarded to PCP, advised we will not be refilling hydrochlorothiazide  due to current hospitalization for hyponatremia. Dr. Shlomo requesting PCP follow up.

## 2024-08-26 NOTE — Progress Notes (Signed)
 Patient has been discharged per MD order. AVS has been reviewed with patient and daughter Harland, verbalized understanding. IV has been removed, tolerated well.

## 2024-08-29 ENCOUNTER — Other Ambulatory Visit: Payer: Self-pay | Admitting: Radiation Oncology

## 2024-08-29 ENCOUNTER — Ambulatory Visit (HOSPITAL_COMMUNITY): Admit: 2024-08-29 | Admitting: Orthopedic Surgery

## 2024-08-29 ENCOUNTER — Inpatient Hospital Stay
Admission: RE | Admit: 2024-08-29 | Discharge: 2024-08-29 | Disposition: A | Payer: Self-pay | Source: Ambulatory Visit | Attending: Radiation Oncology | Admitting: Radiation Oncology

## 2024-08-29 ENCOUNTER — Inpatient Hospital Stay
Admission: RE | Admit: 2024-08-29 | Discharge: 2024-08-29 | Disposition: A | Discharge status: Home / Self Care | Payer: Self-pay | Source: Ambulatory Visit | Attending: Radiation Oncology | Admitting: Radiation Oncology

## 2024-08-29 ENCOUNTER — Encounter: Payer: Self-pay | Admitting: General Practice

## 2024-08-29 DIAGNOSIS — Z170 Estrogen receptor positive status [ER+]: Secondary | ICD-10-CM

## 2024-08-29 SURGERY — ARTHROPLASTY, HIP, TOTAL, ANTERIOR APPROACH
Anesthesia: Choice | Site: Hip | Laterality: Right

## 2024-08-29 NOTE — Progress Notes (Signed)
[facility] Spiritual Care Note  Attempted [facility] follow-up call, leaving voicemail with direct number and encouragement to return call.   Chaplain Jude Linck, MDiv, [facility] Pager [phone] Voicemail [phone] 

## 2024-08-30 LAB — SURGICAL PATHOLOGY

## 2024-08-31 ENCOUNTER — Encounter: Payer: Self-pay | Admitting: Genetic Counselor

## 2024-08-31 ENCOUNTER — Encounter: Payer: Self-pay | Admitting: *Deleted

## 2024-08-31 DIAGNOSIS — Z1379 Encounter for other screening for genetic and chromosomal anomalies: Secondary | ICD-10-CM | POA: Insufficient documentation

## 2024-09-02 ENCOUNTER — Ambulatory Visit
Admission: RE | Admit: 2024-09-02 | Discharge: 2024-09-02 | Disposition: A | Discharge status: Home / Self Care | Source: Ambulatory Visit | Attending: Family Medicine | Admitting: Family Medicine

## 2024-09-02 DIAGNOSIS — G9389 Other specified disorders of brain: Secondary | ICD-10-CM | POA: Diagnosis not present

## 2024-09-02 DIAGNOSIS — R42 Dizziness and giddiness: Secondary | ICD-10-CM

## 2024-09-05 DIAGNOSIS — I10 Essential (primary) hypertension: Secondary | ICD-10-CM | POA: Diagnosis not present

## 2024-09-05 DIAGNOSIS — E871 Hypo-osmolality and hyponatremia: Secondary | ICD-10-CM | POA: Diagnosis not present

## 2024-09-05 DIAGNOSIS — Z853 Personal history of malignant neoplasm of breast: Secondary | ICD-10-CM | POA: Diagnosis not present

## 2024-09-05 DIAGNOSIS — Z9889 Other specified postprocedural states: Secondary | ICD-10-CM | POA: Diagnosis not present

## 2024-09-06 ENCOUNTER — Ambulatory Visit: Payer: Self-pay | Admitting: Genetic Counselor

## 2024-09-06 ENCOUNTER — Telehealth: Payer: Self-pay | Admitting: Genetic Counselor

## 2024-09-06 DIAGNOSIS — Z1379 Encounter for other screening for genetic and chromosomal anomalies: Secondary | ICD-10-CM

## 2024-09-06 DIAGNOSIS — C50211 Malignant neoplasm of upper-inner quadrant of right female breast: Secondary | ICD-10-CM

## 2024-09-06 NOTE — Progress Notes (Signed)
 GENETIC TEST RESULTS   Patient Name: Tabitha Murphy Patient Age: 83 y.o. Encounter Date: 09/06/2024  Referring Provider: Mackey Chad, MD    Tabitha Murphy was seen in the Cancer Genetics clinic on August 17, 2024 due to a personal and family history of cancer and concern regarding a hereditary predisposition to cancer in the family. Please refer to the prior Genetics clinic note for more information regarding Tabitha Murphy's medical and family histories and our assessment at the time.   FAMILY HISTORY:  We obtained a detailed, 4-generation family history.  Significant diagnoses are listed below: Family History  Problem Relation Age of Onset   Uterine cancer Mother 30   Heart attack Mother    Heart attack Father        Died in early 35s with MI   CAD Sister    Diabetes Sister    Hypertension Sister    Diabetes Sister    Hypertension Sister    Hypertension Sister    Heart attack Sister 80   Hypertension Sister    Heart disease Brother    Heart attack Brother    Heart attack Brother    Heart attack Brother    Heart attack Brother 35   Colon cancer Brother 40   Pancreatic cancer Maternal Aunt        dx. in her 8s   CVA Maternal Grandmother    CVA Maternal Grandfather    Diabetes Paternal Grandmother        The patient has three children who are cancer free.  She has four brothers and four sisters, one brother was diagnosed with colon cancer at 2. Both parents are deceased.  Her mother died at 57 from uterine cancer she developed at age 8.  Her mother had eight siblings, one sister had pancreatic cancer.   There was no other cancer reported in the family.   Tabitha Murphy is unaware of previous family history of genetic testing for hereditary cancer risks. There is no reported Ashkenazi Jewish ancestry. There is no known consanguinity  GENETIC TESTING:  At the time of Tabitha Murphy's visit, we recommended she pursue genetic testing of the CancerNext-Expanded+RNAinsight  test. 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     Clinical Information: Single pathogenic variants in the SDHA gene are associated with the development of paragangliomas (PGL) and pheochromocytomas [facility]) as seen in hereditary paraganglioma-pheochromocytoma syndrome, gastrointestinal stromal tumors (GIST) and rarely kidney cancer. The clinical presentation is highly variable among those with a pathogenic variant in SDHA and may be difficult to predict. An individual with a single SDHA pathogenic variant will not necessarily develop a PGL/PCC/GIST in their lifetime, but the risk is increased over that of the general population.  The tumors associated with SDHA are: PCC/PGL, 1-10% risk Limited data suggest there may be an increased risk of GIST tumors and kidney cancer   Management Recommendations: Management for individuals with SDHA mutations can be found in the NCCN Neuroendocrine and Adrenal guidelines (v1.2025).  Since SDH genes have variability in their tumor penetrance and risk for malignancy, consideration can be given to modified screening intervals, especially for less penetrant genes such as SDHA.  These guidelines recommend the following:  PGL/PCC   Surveillance starting at 10-15 years. If asymptomatic and without a prior history of elevation, annual follow-up and testing can be omitted or done with imaging  every 2-3 years.  Blood pressure monitoring at all medical visits.  Annual measurement of plasma free metanephrines or 24-hour urine for fractionated metanephrines.  Cross-sectional imaging of skull base to pelvis every 2-3 years.  Whole body MRI (if available) or other non-radiation-containing imaging procedures.  If whole body MRI not available, may consider abdomen MRI, skull base and neck MRI, and  chest CT.   GIST/Kidney Cancer  Abdominal MRI (preferred) or CT, both exams obtained with and without IV contrast, every 2 years concurrently with PGL/ [facility] screening recommendations starting at age 19 y.    Summary of surveillance recommendations:   Recommendation Screening - SDHA  Age to begin screening 5-10 years  Physical exam and blood pressure Every 6-12 months  Urinary excretion of fractionated metanephrines and catecholamines in 24 hours Annually  Full body MRI Every 2-3 years    It is important to note that individuals with a pathogenic SDHA variant may be at a greater risk of developing PGL or PCC when living in higher altitudes or when chronically exposed to hypoxic conditions. In general, inactivating mutations in one of the succinate dehydrogenase genes, which includes SDHA, can lead to accumulation of succinate, the formation of reactive oxygen species, and the activation of hypoxia-dependent pathways. Avoidance of living at high altitudes and activities that promote long-term exposure to hypoxia and predispose to chronic lung disease (e.g., smoking) is therefore encouraged.  Inheritance Pathogenic variants in SDHA have an autosomal dominant inheritance. This means that an individual with a pathogenic variant has a 50% chance of passing the condition on to their offspring. With this result, it is now possible to identify at-risk relatives who can pursue testing for this specific familial variant. Many cases are inherited from a parent, but some cases can occur spontaneously (i.e., an individual with a pathogenic variant has parents who do not have it).  Individuals with a pathogenic variant in SDHA are also carriers of mitochondrial complex II deficiency syndrome. This autosomal recessive condition is characterized by neurodegeneration and encephalomyopathy. For there to be a risk of this condition in offspring, both parents each must have a pathogenic variant in SDHA; in such a case,  the risk of having an affected child is 25%.  It is important that all of Tabitha Murphy's relatives (both men and women) know of the presence of this gene mutation. Site-specific genetic testing can sort out who in the family is at risk and who is not.  New mutations can occur in an individual such that neither parent is affected.  These mutations are referred to as de novo mutations.    Ms. Herrmann's children and siblings have a 50% chance to have inherited this mutation. We recommend they have genetic testing for this same mutation, as identifying the presence of this mutation would allow them to also take advantage of risk-reducing measures. Interested relatives may contact me at [phone] if they have questions or concerns regarding the family history.  Alternatively, they may search the [employer] website at [email or web address] to locate the nearest cancer genetic counselor.    PLAN: Ms. Leap should be followed based on her diagnosis of a SDHA mutation.    Our recommendations include: Consider a referral to an Endocrinologist to follow for PGL/PCC. Consider genetic testing in siblings and children to identify family members who are at risk for developing PGL/PCC.  SUPPORT RESOURCES:   The [employer] at www.pheopara.org is a Surveyor, mining for both physicians and patients dealing  with neuroendocrine diseases.      We encouraged Ms. Heick to remain in contact with us  on an annual basis so we can update her personal and family histories, and let her know of advances in cancer genetics that may benefit the family. Our contact number was provided. Ms. Janota questions were answered to her satisfaction today, and she knows she is welcome to call anytime with additional questions.   Sven Pinheiro P. Perri, MS, [facility] Licensed, Patent attorney [email or web address] phone: [phone]

## 2024-09-06 NOTE — Telephone Encounter (Signed)
 Discussed result with patient.  She seemed confused about the result, and I offered to call her daughter to discuss.  Patient agreed to that.  Called patient's daughter, Harland.  Revealed that genetic testing did not identify a hereditary cause for the breast, uterine or colon cancer in the family.  However we did identify a pathogenic variant in SDHA that can increase the risk for PGL/Pheo.  SDHA has the lowest risk for developing these tumors, but we recommend annual BP monitoring, baseline plasma metanepherine levels, and skull based to pelvis imaging.  Patient's daughter voiced her understanding.  Discussed that genetic testing is recommended for siblings/children/nieces and nephews of the patient.  Her siblings and children have a 50% chance of having this mutation.  Complementary testing is available for this specific variant for 90 days.

## 2024-09-13 ENCOUNTER — Other Ambulatory Visit: Payer: Self-pay | Admitting: Cardiology

## 2024-09-13 DIAGNOSIS — I10 Essential (primary) hypertension: Secondary | ICD-10-CM

## 2024-09-14 NOTE — Progress Notes (Signed)
 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 None Pacemaker/ICD? None Possible current pregnancy?None Is the patient on methotrexate? None  Current Complaints / other details:  None

## 2024-09-15 ENCOUNTER — Other Ambulatory Visit: Payer: Self-pay

## 2024-09-17 ENCOUNTER — Other Ambulatory Visit: Payer: Self-pay | Admitting: Cardiology

## 2024-09-17 DIAGNOSIS — I10 Essential (primary) hypertension: Secondary | ICD-10-CM

## 2024-09-19 DIAGNOSIS — I10 Essential (primary) hypertension: Secondary | ICD-10-CM | POA: Diagnosis not present

## 2024-09-19 NOTE — Progress Notes (Signed)
 Radiation Oncology         [phone] ________________________________  Name: Tabitha Murphy MRN: [id number]  Date: 09/20/2024  DOB: 1941-11-21  Follow-Up Visit Note  Outpatient  CC: Rolinda Millman, MD  Odean Potts, MD  Diagnosis:   No diagnosis found. ***  Stage IA Right Breast UIQ, Invasive ductal carcinoma w/ focal intermediate grade DCIS, ER+ / PR+ / Her2-, Grade 2    Cancer Staging  Malignant neoplasm of upper-inner quadrant of right breast in female, estrogen receptor positive (HCC) Staging form: Breast, AJCC 8th Edition - Clinical stage from 08/17/2024: Stage IA (cT1b, cN0, cM0, G2, ER+, PR+, HER2-) - Unsigned  CHIEF COMPLAINT: Here to discuss management of right breast cancer  Narrative:  The patient returns today for follow-up. She was last seen in consultation on 08/17/24.    She was seen in consultation with Dr. Odean on 9/17 where the opted to not proceed with antiestrogen therapy following radiation due to her difficulty to swallow pills.     Since consultation date, she underwent genetic testing on 9/17. 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     Patient opted to proceed with a right breast seed guided lumpectomy with magnetic marker localization on 08/25/24 under the care of Dr. Ebbie. Surgical pathology revealed: tumor size of 1.1 cm; histology of grade 2 invasive ductal carcinoma; all margins are negative for invasive disease of 10 mm;  ER status: 100%, positive, strong staining intensity; PR status 90%, positive, strong staining intensity, Her2 status negative by FISH; Grade 2. Ki-67: 10%.  Most recent post-op follow up with Dr. Ebbie on 10/20, her icision healing well without any evidence of infection.   Symptomatically, the patient reports: ***        ALLERGIES:  is allergic to doxazosin mesylate, meclizine, oxycodone hcl, zolpidem, amlodipine besylate, codeine, crest tartar control,  cymbalta  [duloxetine  hcl], duloxetine , gabapentin, lyrica  [pregabalin ], rosuvastatin  calcium , and zolpidem tartrate.  Meds: Current Outpatient Medications  Medication Sig Dispense Refill   albuterol  (VENTOLIN  HFA) 108 (90 Base) MCG/ACT inhaler Inhale 2 puffs into the lungs every 4 (four) hours as needed for wheezing or shortness of breath.     ALPRAZolam  (XANAX ) 0.5 MG tablet Take 0.5 mg by mouth daily as needed for anxiety or sleep.     Ascorbic Acid (VITAMIN C PO) Take 1 tablet by mouth daily.     atenolol  (TENORMIN ) 100 MG tablet TAKE 1/2 TABLET BY MOUTH TWICE A DAY 30 tablet 0   B Complex-C (SUPER B COMPLEX PO) Take 1 tablet by mouth daily.     Biotin 89999 MCG TBDP Take 10,000 mcg by mouth daily.     CALCIUM -VITAMIN D PO Take 1 tablet by mouth daily.     ELIQUIS  5 MG TABS tablet TAKE 1 TABLET BY MOUTH TWICE A DAY 180 tablet 1   irbesartan  (AVAPRO ) 300 MG tablet Take 300 mg by mouth every morning.     levothyroxine  (SYNTHROID ) 25 MCG tablet Take 25 mcg by mouth daily before breakfast.     lidocaine -prilocaine  (EMLA ) cream Apply 1 Application topically 2 (two) times daily as needed (feet burning/pain.).     meclizine (ANTIVERT) 25 MG tablet Take 25 mg by mouth every 12 (twelve) hours as needed for dizziness.     METAMUCIL FIBER PO Take 3 each by mouth daily. Chewables     Multiple Vitamin (MULTIVITAMIN) tablet Take 1 tablet by mouth daily.  Centrum     omeprazole  (PRILOSEC   OTC) 20 MG tablet Take 10 mg by mouth daily before breakfast.     ondansetron  (ZOFRAN ) 4 MG tablet Take 4 mg by mouth daily as needed for nausea or vomiting.     Polyethyl Glycol-Propyl Glycol (SYSTANE) 0.4-0.3 % SOLN Place 1 drop into both eyes 3 (three) times daily as needed (dry/irritated eyes.).     promethazine  (PHENERGAN ) 25 MG tablet Take 25 mg by mouth every 6 (six) hours as needed for nausea or vomiting.     rosuvastatin  (CRESTOR ) 5 MG tablet TAKE 1 TABLET (5 MG TOTAL) BY MOUTH DAILY. 90 tablet 3   scopolamine  (TRANSDERM-SCOP) 1 MG/3DAYS Place 1 patch onto the skin every 3 (three) days as needed (nausea).     Selenium 100 MCG TABS Take 100 mcg by mouth daily.     traZODone  (DESYREL ) 50 MG tablet Take 50 mg by mouth at bedtime as needed for sleep.     valACYclovir (VALTREX) 1000 MG tablet Take 2,000 mg by mouth 2 (two) times daily as needed (fever blisters).     No current facility-administered medications for this visit.    Physical Findings:  vitals were not taken for this visit. .     General: Alert and oriented, in no acute distress HEENT: Head is normocephalic. Extraocular movements are intact. Oropharynx is clear. Neck: Neck is supple, no palpable cervical or supraclavicular lymphadenopathy. Heart: Regular in rate and rhythm with no murmurs, rubs, or gallops. Chest: Clear to auscultation bilaterally, with no rhonchi, wheezes, or rales. Abdomen: Soft, nontender, nondistended, with no rigidity or guarding. Extremities: No cyanosis or edema. Lymphatics: see Neck Exam Musculoskeletal: symmetric strength and muscle tone throughout. Neurologic: No obvious focalities. Speech is fluent.  Psychiatric: Judgment and insight are intact. Affect is appropriate. Breast exam reveals ***  Lab Findings: Lab Results  Component Value Date   WBC 8.2 08/17/2024   HGB 11.9 (L) 08/25/2024   HCT 35.0 (L) 08/25/2024   MCV 85.2 08/17/2024   PLT 314 08/17/2024    @LASTCHEMISTRY @  Radiographic Findings: CT HEAD WO CONTRAST ( ) Result Date: 09/09/2024 CLINICAL DATA:  Provided history: Dizziness. Additional history provided by the scanning technologist: Breast cancer. EXAM: CT HEAD WITHOUT CONTRAST TECHNIQUE: Contiguous axial images were obtained from the base of the skull through the vertex without intravenous contrast. RADIATION DOSE REDUCTION: This exam was performed according to the departmental dose-optimization program which includes automated exposure control, adjustment of the mA and/or kV according  to patient size and/or use of iterative reconstruction technique. COMPARISON:  Head CT 10/16/2022. FINDINGS: Brain: No age-advanced or lobar predominant cerebral atrophy. Patchy and ill-defined hypoattenuation within the cerebral white matter and pons, nonspecific but compatible with moderate chronic small vessel ischemic disease. There is no acute intracranial hemorrhage. No demarcated cortical infarct. No extra-axial fluid collection. No evidence of an intracranial mass. No midline shift. Vascular: No hyperdense vessel. Atherosclerotic calcifications. Skull: No calvarial fracture or aggressive osseous lesion. Sinuses/Orbits: No mass or acute finding within the imaged orbits. No significant paranasal sinus disease at the imaged levels. Other: Unchanged 16 mm partially calcified midline frontal scalp lesion, likely reflecting a sebaceous cyst. IMPRESSION: 1. No evidence of an acute intracranial abnormality. 2. Moderate chronic small vessel ischemic disease. 3. Unchanged 16 mm partially calcified midline frontal scalp lesion, likely reflecting a sebaceous cyst. Electronically Signed   By: Rockey Childs D.O.   On: 09/09/2024 08:15    Impression/Plan: We discussed adjuvant radiotherapy today.  I recommend *** in order to ***.  I  reviewed the logistics, benefits, risks, and potential side effects of this treatment in detail. Risks may include but not necessary be limited to acute and late injury tissue in the radiation fields such as skin irritation (change in color/pigmentation, itching, dryness, pain, peeling). She may experience fatigue. We also discussed possible risk of long term cosmetic changes or scar tissue. There is also a smaller risk for lung toxicity, ***cardiac toxicity, ***brachial plexopathy, ***lymphedema, ***musculoskeletal changes, ***rib fragility or ***induction of a second malignancy, ***late chronic non-healing soft tissue wound.    The patient asked good questions which I answered to her  satisfaction. She is enthusiastic about proceeding with treatment. A consent form has been *** signed and placed in her chart.  A total of *** medically necessary complex treatment devices will be fabricated and supervised by me: *** fields with MLCs for custom blocks to protect heart, and lungs;  and, a Vac-lok. MORE COMPLEX DEVICES MAY BE MADE IN DOSIMETRY FOR FIELD IN FIELD BEAMS FOR DOSE HOMOGENEITY.  I have requested : 3D Simulation which is medically necessary to give adequate dose to at risk tissues while sparing lungs and heart.  I have requested a DVH of the following structures: lungs, heart, *** lumpectomy cavity.    The patient will receive *** Gy in *** fractions to the *** with *** fields.  This will be *** followed by a boost.  On date of service, in total, I spent *** minutes on this encounter. Patient was seen in person.  _____________________________________   Lauraine Golden, MD  This document serves as a record of services personally performed by Lauraine Golden, MD. It was created on her behalf by Reymundo Cartwright, a trained medical scribe. The creation of this record is based on the scribe's personal observations and the provider's statements to them. This document has been checked and approved by the attending provider.

## 2024-09-20 ENCOUNTER — Ambulatory Visit
Admission: RE | Admit: 2024-09-20 | Discharge: 2024-09-20 | Disposition: A | Source: Ambulatory Visit | Attending: Radiation Oncology | Admitting: Radiation Oncology

## 2024-09-20 ENCOUNTER — Ambulatory Visit
Admission: RE | Admit: 2024-09-20 | Discharge: 2024-09-20 | Disposition: A | Discharge status: Home / Self Care | Source: Ambulatory Visit | Attending: Radiation Oncology | Admitting: Radiation Oncology

## 2024-09-20 ENCOUNTER — Encounter: Payer: Self-pay | Admitting: Radiation Oncology

## 2024-09-20 VITALS — BP 147/76 | HR 59 | Temp 97.4°F | Resp 20 | Ht 61.0 in | Wt 155.8 lb

## 2024-09-20 DIAGNOSIS — C50211 Malignant neoplasm of upper-inner quadrant of right female breast: Secondary | ICD-10-CM | POA: Diagnosis not present

## 2024-09-20 DIAGNOSIS — Z170 Estrogen receptor positive status [ER+]: Secondary | ICD-10-CM | POA: Diagnosis not present

## 2024-09-23 ENCOUNTER — Ambulatory Visit
Admission: RE | Admit: 2024-09-23 | Discharge: 2024-09-23 | Disposition: A | Discharge status: Home / Self Care | Source: Ambulatory Visit | Attending: Radiation Oncology | Admitting: Radiation Oncology

## 2024-09-23 DIAGNOSIS — C50211 Malignant neoplasm of upper-inner quadrant of right female breast: Secondary | ICD-10-CM | POA: Diagnosis not present

## 2024-09-23 DIAGNOSIS — Z510 Encounter for antineoplastic radiation therapy: Secondary | ICD-10-CM | POA: Insufficient documentation

## 2024-09-25 ENCOUNTER — Other Ambulatory Visit: Payer: Self-pay | Admitting: Cardiology

## 2024-09-29 ENCOUNTER — Encounter: Payer: Self-pay | Admitting: *Deleted

## 2024-09-29 DIAGNOSIS — Z170 Estrogen receptor positive status [ER+]: Secondary | ICD-10-CM

## 2024-09-30 DIAGNOSIS — Z170 Estrogen receptor positive status [ER+]: Secondary | ICD-10-CM | POA: Diagnosis not present

## 2024-09-30 DIAGNOSIS — Z510 Encounter for antineoplastic radiation therapy: Secondary | ICD-10-CM | POA: Diagnosis not present

## 2024-09-30 DIAGNOSIS — C50211 Malignant neoplasm of upper-inner quadrant of right female breast: Secondary | ICD-10-CM | POA: Diagnosis not present

## 2024-10-03 ENCOUNTER — Ambulatory Visit: Admitting: Radiation Oncology

## 2024-10-03 DIAGNOSIS — I10 Essential (primary) hypertension: Secondary | ICD-10-CM | POA: Diagnosis not present

## 2024-10-04 ENCOUNTER — Other Ambulatory Visit: Payer: Self-pay

## 2024-10-04 ENCOUNTER — Ambulatory Visit
Admission: RE | Admit: 2024-10-04 | Discharge: 2024-10-04 | Disposition: A | Discharge status: Home / Self Care | Source: Ambulatory Visit | Attending: Radiation Oncology | Admitting: Radiation Oncology

## 2024-10-04 ENCOUNTER — Ambulatory Visit

## 2024-10-04 DIAGNOSIS — Z510 Encounter for antineoplastic radiation therapy: Secondary | ICD-10-CM | POA: Diagnosis not present

## 2024-10-04 DIAGNOSIS — C50211 Malignant neoplasm of upper-inner quadrant of right female breast: Secondary | ICD-10-CM | POA: Diagnosis not present

## 2024-10-04 DIAGNOSIS — Z170 Estrogen receptor positive status [ER+]: Secondary | ICD-10-CM | POA: Diagnosis not present

## 2024-10-04 LAB — RAD ONC ARIA SESSION SUMMARY
Course Elapsed Days: 0
Plan Fractions Treated to Date: 1
Plan Prescribed Dose Per Fraction: 5.7 Gy
Plan Total Fractions Prescribed: 5
Plan Total Prescribed Dose: 28.5 Gy
Reference Point Dosage Given to Date: 5.7 Gy
Reference Point Session Dosage Given: 5.7 Gy
Session Number: 1

## 2024-10-05 ENCOUNTER — Ambulatory Visit

## 2024-10-06 ENCOUNTER — Ambulatory Visit

## 2024-10-07 ENCOUNTER — Ambulatory Visit

## 2024-10-11 ENCOUNTER — Other Ambulatory Visit: Payer: Self-pay

## 2024-10-11 ENCOUNTER — Ambulatory Visit
Admission: RE | Admit: 2024-10-11 | Discharge: 2024-10-11 | Disposition: A | Discharge status: Home / Self Care | Source: Ambulatory Visit | Attending: Radiation Oncology | Admitting: Radiation Oncology

## 2024-10-11 ENCOUNTER — Ambulatory Visit

## 2024-10-11 DIAGNOSIS — Z510 Encounter for antineoplastic radiation therapy: Secondary | ICD-10-CM | POA: Diagnosis not present

## 2024-10-11 DIAGNOSIS — C50211 Malignant neoplasm of upper-inner quadrant of right female breast: Secondary | ICD-10-CM | POA: Diagnosis not present

## 2024-10-11 DIAGNOSIS — Z170 Estrogen receptor positive status [ER+]: Secondary | ICD-10-CM | POA: Diagnosis not present

## 2024-10-11 LAB — RAD ONC ARIA SESSION SUMMARY
Course Elapsed Days: 7
Plan Fractions Treated to Date: 2
Plan Prescribed Dose Per Fraction: 5.7 Gy
Plan Total Fractions Prescribed: 5
Plan Total Prescribed Dose: 28.5 Gy
Reference Point Dosage Given to Date: 11.4 Gy
Reference Point Session Dosage Given: 5.7 Gy
Session Number: 2

## 2024-10-18 ENCOUNTER — Ambulatory Visit
Admission: RE | Admit: 2024-10-18 | Discharge: 2024-10-18 | Disposition: A | Discharge status: Home / Self Care | Source: Ambulatory Visit | Attending: Radiation Oncology | Admitting: Radiation Oncology

## 2024-10-18 ENCOUNTER — Ambulatory Visit
Admission: RE | Admit: 2024-10-18 | Discharge: 2024-10-18 | Disposition: A | Source: Ambulatory Visit | Attending: Radiation Oncology

## 2024-10-18 ENCOUNTER — Other Ambulatory Visit: Payer: Self-pay

## 2024-10-18 DIAGNOSIS — C50211 Malignant neoplasm of upper-inner quadrant of right female breast: Secondary | ICD-10-CM | POA: Diagnosis not present

## 2024-10-18 DIAGNOSIS — Z170 Estrogen receptor positive status [ER+]: Secondary | ICD-10-CM | POA: Diagnosis not present

## 2024-10-18 DIAGNOSIS — Z510 Encounter for antineoplastic radiation therapy: Secondary | ICD-10-CM | POA: Diagnosis not present

## 2024-10-18 LAB — RAD ONC ARIA SESSION SUMMARY
Course Elapsed Days: 14
Plan Fractions Treated to Date: 3
Plan Prescribed Dose Per Fraction: 5.7 Gy
Plan Total Fractions Prescribed: 5
Plan Total Prescribed Dose: 28.5 Gy
Reference Point Dosage Given to Date: 17.1 Gy
Reference Point Session Dosage Given: 5.7 Gy
Session Number: 3

## 2024-10-19 ENCOUNTER — Telehealth: Payer: Self-pay | Admitting: Cardiology

## 2024-10-19 NOTE — Telephone Encounter (Signed)
 Spoke with patient regarding low blood pressures and low heart rates after Radiation. Pt also c/o weak, nausea and dizziness after radiation. Pt requesting to stop Radiation treatment. Advised patient to speak with her Oncologist in regards to cancer treatment. Pt asking to stop all blood pressure medications. Advised patient to keep track of her BP readings and will forward message to Dr. Shlomo and Dr. Cindie.  Thank you,  [place]ell, RN [facility] T[facility]

## 2024-10-19 NOTE — Telephone Encounter (Signed)
 Pt c/o BP issue: STAT if pt c/o blurred vision, one-sided weakness or slurred speech.  STAT if BP is GREATER than 180/120 TODAY.  STAT if BP is LESS than 90/60 and SYMPTOMATIC TODAY  1. What is your BP concern? BP is running low since starting the radiation   2. Have you taken any BP medication today? Yes, took her BP medication 1 hour ago  3. What are your last 5 BP readings? 116/64 59 HR - Last night  129/76 HR 60 - while on the phone   4. Are you having any other symptoms (ex. Dizziness, headache, blurred vision, passed out)? Feels weak   Patient's BP is going down at night since starting her radiation treatments. Patient stated she has radiation on Tuesday's. Patient stated the bottom number for her BP is the number that keeps getting low. Patient mentioned that the bottom number was in the 40's one night. Please advise.

## 2024-10-20 ENCOUNTER — Encounter: Payer: Self-pay | Admitting: Cardiology

## 2024-10-20 NOTE — Telephone Encounter (Signed)
 Patient has an appointment with [facility] tomorrow 11/21.

## 2024-10-20 NOTE — Telephone Encounter (Signed)
 Call to patient to share Dr. Dorine recommendation to follow up with oncology. Patient verbalizes understanding and agrees to plan but states she wants to keep appt with APP 10/21/24.

## 2024-10-21 ENCOUNTER — Ambulatory Visit: Attending: General Practice | Admitting: General Practice

## 2024-10-21 ENCOUNTER — Encounter: Payer: Self-pay | Admitting: General Practice

## 2024-10-21 VITALS — BP 110/48 | HR 66 | Ht 61.5 in | Wt 157.0 lb

## 2024-10-21 DIAGNOSIS — I25118 Atherosclerotic heart disease of native coronary artery with other forms of angina pectoris: Secondary | ICD-10-CM

## 2024-10-21 DIAGNOSIS — I420 Dilated cardiomyopathy: Secondary | ICD-10-CM

## 2024-10-21 DIAGNOSIS — I4819 Other persistent atrial fibrillation: Secondary | ICD-10-CM | POA: Diagnosis not present

## 2024-10-21 DIAGNOSIS — I10 Essential (primary) hypertension: Secondary | ICD-10-CM | POA: Diagnosis not present

## 2024-10-21 LAB — CBC

## 2024-10-21 MED ORDER — HYDRALAZINE HCL 10 MG PO TABS: 10.0000 mg | ORAL_TABLET | Freq: Three times a day (TID) | ORAL | 2 refills | Status: AC

## 2024-10-21 MED ORDER — HYDROCHLOROTHIAZIDE 12.5 MG PO CAPS: 12.5000 mg | ORAL_CAPSULE | Freq: Every day | ORAL | 3 refills | Status: AC

## 2024-10-21 NOTE — Progress Notes (Signed)
 Cardiology Clinic Note   Patient Name: Tabitha Murphy Date of Encounter: 10/21/2024  Primary Care Provider:  Rolinda Millman, MD Primary Cardiologist:  Wilbert Bihari, MD  Patient Profile    Tabitha Murphy 83 year old female presents to the clinic today for evaluation of her blood pressure and heart rate.  Past Medical History    Past Medical History:  Diagnosis Date   Anxiety    Arthritis    Benign essential HTN    CAD (coronary artery disease), native coronary artery    Cath 2012 with 30% LCx; Coronary CTA showed minimal mixed nonobstructive disease.  Less than 25% ostial LAD and left circumflex with coronary calcium  score 41.7 on  12/2022   Complication of anesthesia    slow to wake up   DCM (dilated cardiomyopathy) (HCC)    ? tachy mediated from afib with RVR.  EF 45-50% by echo 01/2021.  Lexiscan  myoview  with no ischemia   Diverticulosis    Dyspnea    Family history of colon cancer    Family history of pancreatic cancer    Family history of uterine cancer    Heart murmur    Hiatal hernia    HLD (hyperlipidemia)    Persistent atrial fibrillation (HCC)    On DOAC   PONV (postoperative nausea and vomiting)    Pre-diabetes    S/P laparoscopic cholecystectomy 07/28/2011   23 years ago   Sleep apnea    cpap   Thyroid  goiter    Past Surgical History:  Procedure Laterality Date   BALLOON DILATION N/A 03/24/2023   Procedure: BALLOON DILATION;  Surgeon: Saintclair Jasper, MD;  Location: [facility];  Service: Gastroenterology;  Laterality: N/A;   CARDIOVERSION N/A 02/29/2024   Procedure: CARDIOVERSION;  Surgeon: Barbaraann Darryle Ned, MD;  Location[facility] CV [facility];  Service: Cardiovascular;  Laterality: N/A;   child birth     x3   CHOLECYSTECTOMY     ESOPHAGEAL MANOMETRY N/A 07/22/2023   Procedure: ESOPHAGEAL MANOMETRY (EM);  Surgeon: Dianna Specking, MD;  Location: [facility];  Service: Gastroenterology;  Laterality: N/A;   ESOPHAGOGASTRODUODENOSCOPY (EGD) WITH  PROPOFOL  N/A 03/24/2023   Procedure: ESOPHAGOGASTRODUODENOSCOPY (EGD) WITH PROPOFOL ;  Surgeon: Saintclair Jasper, MD;  Location: [facility];  Service: Gastroenterology;  Laterality: N/A;   TOTAL HIP ARTHROPLASTY Left 04/20/2024   Procedure: ARTHROPLASTY, HIP, TOTAL, ANTERIOR APPROACH;  Surgeon: Melodi Lerner, MD;  Location: [facility];  Service: Orthopedics;  Laterality: Left;    Allergies  Allergies  Allergen Reactions   Doxazosin Mesylate Shortness Of Breath    Other reaction(s): SOB   Meclizine Other (See Comments)   Oxycodone Hcl Other (See Comments)    Can take with promethazine     Zolpidem Other (See Comments)   Amlodipine Besylate     Other reaction(s): swelling along with aches and pains   Codeine     REACTION: nausea   [place] Control     Other reaction(s): skin around mouth comes off   Cymbalta  [Duloxetine  Hcl]     Other reaction(s): Heaviness and pains in chest and back/dry mouth   Duloxetine  Diarrhea   Gabapentin Swelling    Other reaction(s): causes aches and pains Knee swelling   Lyrica  [Pregabalin ] Other (See Comments)    Made pain in legs and feet worse.   Rosuvastatin  Calcium  Other (See Comments)    Other reaction(s): knee and leg pains Patient taking 5 mg with not problem   Zolpidem Tartrate     Other reaction(s): retrograde  amnesia    History of Present Illness    Tabitha Murphy has a PMH of permanent atrial fibrillation, HFrEF (EF improved to 55-60% 9/23), chronic dysphagia with negative endoscopy 4/24, and ductal carcinoma.  She was admitted to the hospital on 08/25/2024 and discharged on 08/26/2024.  She was noted to have mild hyponatremia.  Her baseline is around 128.  She was previously instructed to restrict fluid to 32 ounces.  Her sodium was noted to be between 122 and 124.  Her sodium elevated to 125 at discharge with fluid restriction.  Her HCTZ was discontinued.  She was continued on statin therapy, atenolol , Synthroid  and instructed to follow-up with  GI.  Her blood pressure was noted to be 103/48 with a pulse of 56.  She contacted the nurse triage line yesterday and reported low blood pressures and heart rate post radiation treatment.  She was instructed to follow-up with oncology.  She reported blood pressure of 90/60 and a heart rate of 59.  At the time of the call her blood pressure was 129/76 with a heart rate of 60.  She presents to the clinic today for evaluation and states since starting radiation she has felt weak, nauseous, and fatigue.  We reviewed her medications and she reports that she has been taking hydralazine  10 mg 4 times daily and HCTZ 25 mg daily along with her other blood pressure medications.  These were prescribed by her PCP.  She notes that her blood pressures have been significantly lower recently.  She reports blood pressures in the 90s over 60s.  She notes that her heart rates have been low at night as well.  We discussed her radiation and cancer treatments.  I will reduce her hydralazine  to 10 mg 3 times daily and have her HCTZ to 12.5 mg daily.  I will order BMP and CBC.  We will repeat her echocardiogram as well.  I will also request her lab work from her PCP and plan follow-up with Dr. Shlomo in 2 to 3 months.  To days she denies chest pain, shortness of breath, lower extremity edema,  palpitations, melena, hematuria, hemoptysis, diaphoresis, presyncope, syncope, orthopnea, and PND.    Home Medications    Prior to Admission medications   Medication Sig Start Date End Date Taking? Authorizing Provider  albuterol  (VENTOLIN  HFA) 108 (90 Base) MCG/ACT inhaler Inhale 2 puffs into the lungs every 4 (four) hours as needed for wheezing or shortness of breath. 03/18/20   [provider]  ALPRAZolam  (XANAX ) 0.5 MG tablet Take 0.5 mg by mouth daily as needed for anxiety or sleep.    [provider]  Ascorbic Acid (VITAMIN C PO) Take 1 tablet by mouth daily.    [provider]  atenolol  (TENORMIN ) 100  MG tablet TAKE 1/2 TABLET BY MOUTH TWICE A DAY 09/20/24   Turner, Wilbert SAUNDERS, MD  B Complex-C (SUPER B COMPLEX PO) Take 1 tablet by mouth daily.    [provider]  Biotin 89999 MCG TBDP Take 10,000 mcg by mouth daily.    [provider]  CALCIUM -VITAMIN D PO Take 1 tablet by mouth daily.    [provider]  ELIQUIS  5 MG TABS tablet TAKE 1 TABLET BY MOUTH TWICE A DAY 03/04/23   Turner, Wilbert SAUNDERS, MD  irbesartan  (AVAPRO ) 300 MG tablet Take 300 mg by mouth every morning. 02/02/21   [provider]  levothyroxine  (SYNTHROID ) 25 MCG tablet Take 25 mcg by mouth daily before breakfast. 02/25/21  [provider]  lidocaine -prilocaine  (EMLA ) cream Apply 1 Application topically 2 (two) times daily as needed (feet burning/pain.). 09/24/23   [provider]  meclizine (ANTIVERT) 25 MG tablet Take 25 mg by mouth every 12 (twelve) hours as needed for dizziness.    [provider]  METAMUCIL FIBER PO Take 3 each by mouth daily. Chewables    [provider]  Multiple Vitamin (MULTIVITAMIN) tablet Take 1 tablet by mouth daily.  Centrum    [provider]  omeprazole  (PRILOSEC  OTC) 20 MG tablet Take 10 mg by mouth daily before breakfast.    [provider]  ondansetron  (ZOFRAN ) 4 MG tablet Take 4 mg by mouth daily as needed for nausea or vomiting.    [provider]  Polyethyl Glycol-Propyl Glycol (SYSTANE) 0.4-0.3 % SOLN Place 1 drop into both eyes 3 (three) times daily as needed (dry/irritated eyes.).    [provider]  promethazine  (PHENERGAN ) 25 MG tablet Take 25 mg by mouth every 6 (six) hours as needed for nausea or vomiting.    [provider]  rosuvastatin  (CRESTOR ) 5 MG tablet TAKE 1 TABLET (5 MG TOTAL) BY MOUTH DAILY. 02/20/23   Shlomo Wilbert SAUNDERS, MD  scopolamine (TRANSDERM-SCOP) 1 MG/3DAYS Place 1 patch onto the skin every 3 (three) days as needed (nausea).    [provider]  Selenium 100  MCG TABS Take 100 mcg by mouth daily.    [provider]  traZODone  (DESYREL ) 50 MG tablet Take 50 mg by mouth at bedtime as needed for sleep.    [provider]  valACYclovir (VALTREX) 1000 MG tablet Take 2,000 mg by mouth 2 (two) times daily as needed (fever blisters).    [provider]    Family History    Family History  Problem Relation Age of Onset   Uterine cancer Mother 37   Heart attack Mother    Heart attack Father        Died in early 38s with MI   CAD Sister    Diabetes Sister    Hypertension Sister    Diabetes Sister    Hypertension Sister    Hypertension Sister    Heart attack Sister 4   Hypertension Sister    Heart disease Brother    Heart attack Brother    Heart attack Brother    Heart attack Brother    Heart attack Brother 67   Colon cancer Brother 45   Pancreatic cancer Maternal Aunt        dx. in her 52s   CVA Maternal Grandmother    CVA Maternal Grandfather    Diabetes Paternal Grandmother    She indicated that her mother is deceased. She indicated that her father is deceased. She indicated that two of her four sisters are alive. She indicated that three of her four brothers are deceased. She indicated that her maternal grandmother is deceased. She indicated that her maternal grandfather is deceased. She indicated that her paternal grandmother is deceased. She indicated that her paternal grandfather is deceased. She indicated that her daughter is alive. She indicated that both of her sons are alive. She indicated that the status of her maternal aunt is unknown.  Social History    Social History   Socioeconomic History   Marital status: Widowed    Spouse name: Not on file   Number of children: Not on file   Years of education: Not on file   Highest education level: Not on file  Occupational History   Occupation: retired  Tobacco Use   Smoking status: Never   Smokeless tobacco: Never   Tobacco comments:    Never smoke  02/18/22  Vaping Use   Vaping status: Never Used  Substance and Sexual Activity   Alcohol  use: No   Drug use: No    Comment: CBD gummies   Sexual activity: Not Currently  Other Topics Concern   Not on file  Social History Narrative   Not on file   Social Drivers of Health   Financial Resource Strain: Not on file  Food Insecurity: No Food Insecurity (09/20/2024)   Hunger Vital Sign    Worried About Running Out of Food in the Last Year: Never true    Ran Out of Food in the Last Year: Never true  Transportation Needs: No Transportation Needs (09/20/2024)   PRAPARE - Administrator, Civil Service (Medical): No    Lack of Transportation (Non-Medical): No  Physical Activity: Not on file  Stress: Not on file  Social Connections: Moderately Integrated (08/25/2024)   Social Connection and Isolation Panel    Frequency of Communication with Friends and Family: More than three times a week    Frequency of Social Gatherings with Friends and Family: Three times a week    Attends Religious Services: More than 4 times per year    Active Member of Clubs or Organizations: No    Attends Banker Meetings: 1 to 4 times per year    Marital Status: Widowed  Intimate Partner Violence: Not At Risk (09/20/2024)   Humiliation, Afraid, Rape, and Kick questionnaire    Fear of Current or Ex-Partner: No    Emotionally Abused: No    Physically Abused: No    Sexually Abused: No     Review of Systems    General:  No chills, fever, night sweats or weight changes.  Cardiovascular:  No chest pain, dyspnea on exertion, edema, orthopnea, palpitations, paroxysmal nocturnal dyspnea. Dermatological: No rash, lesions/masses Respiratory: No cough, dyspnea Urologic: No hematuria, dysuria Abdominal:   No nausea, vomiting, diarrhea, bright red blood per rectum, melena, or hematemesis Neurologic:  No visual changes, wkns, changes in mental status. All other systems reviewed and are  otherwise negative except as noted above.  Physical Exam    VS:  BP (!) 110/48   Pulse 66   Ht 5' 1.5 (1.562 m)   Wt 157 lb (71.2 kg)   SpO2 97%   BMI 29.18 kg/m  , BMI Body mass index is 29.18 kg/m. GEN: Well nourished, well developed, in no acute distress. HEENT: normal. Neck: Supple, no JVD, carotid bruits, or masses. Cardiac: RRR, no murmurs, rubs, or gallops. No clubbing, cyanosis, edema.  Radials/DP/PT 2+ and equal bilaterally.  Respiratory:  Respirations regular and unlabored, clear to auscultation bilaterally. GI: Soft, nontender, nondistended, BS + x 4. MS: no deformity or atrophy. Skin: warm and dry, no rash. Neuro:  Strength and sensation are intact. Psych: Normal affect.  Accessory Clinical Findings    Recent Labs: 08/17/2024: ALT 10; Platelet Count 314 08/25/2024: Hemoglobin 11.9; TSH 1.169 08/26/2024: BUN 11; Creatinine, Ser 1.05; Potassium 4.2; Sodium 125   Recent Lipid Panel    Component Value Date/Time   CHOL 130 01/02/2023 0000   TRIG 124 01/02/2023 0000   HDL 49 01/02/2023 0000   CHOLHDL 2.7 01/02/2023 0000   CHOLHDL 3.8 02/16/2021 0404   VLDL 22 02/16/2021 0404   LDLCALC 59 01/02/2023 0000  ECG personally reviewed by me today-none today.    Echocardiogram 08/13/2022  IMPRESSIONS     1. Left ventricular ejection fraction, by estimation, is 55 to 60%. The  left ventricle has normal function. The left ventricle has no regional  wall motion abnormalities. Left ventricular diastolic parameters are  consistent with Grade II diastolic  dysfunction (pseudonormalization).   2. Right ventricular systolic function is normal. The right ventricular  size is mildly enlarged.   3. The mitral valve is normal in structure. No evidence of mitral valve  regurgitation.   4. The aortic valve is normal in structure. Aortic valve regurgitation is  not visualized.   5. There is mild dilatation of the ascending aorta, measuring 37 mm.   6. The inferior  vena cava is normal in size with greater than 50%  respiratory variability, suggesting right atrial pressure of 3 mmHg.   Comparison(s): No significant change from prior study.   FINDINGS   Left Ventricle: Left ventricular ejection fraction, by estimation, is 55  to 60%. The left ventricle has normal function. The left ventricle has no  regional wall motion abnormalities. The left ventricular internal cavity  size was normal in size. There is   no left ventricular hypertrophy. Left ventricular diastolic parameters  are consistent with Grade II diastolic dysfunction (pseudonormalization).   Right Ventricle: The right ventricular size is mildly enlarged. No  increase in right ventricular wall thickness. Right ventricular systolic  function is normal.   Left Atrium: Left atrial size was normal in size.   Right Atrium: Right atrial size was normal in size.   Pericardium: Trivial pericardial effusion is present.   Mitral Valve: The mitral valve is normal in structure. No evidence of  mitral valve regurgitation.   Tricuspid Valve: The tricuspid valve is normal in structure. Tricuspid  valve regurgitation is not demonstrated.   Aortic Valve: The aortic valve is normal in structure. Aortic valve  regurgitation is not visualized. Aortic valve mean gradient measures 6.0  mmHg. Aortic valve peak gradient measures 10.6 mmHg. Aortic valve area, by  VTI measures 1.35 cm.   Pulmonic Valve: The pulmonic valve was normal in structure. Pulmonic valve  regurgitation is not visualized.   Aorta: There is mild dilatation of the ascending aorta, measuring 37 mm.   Venous: The inferior vena cava is normal in size with greater than 50%  respiratory variability, suggesting right atrial pressure of 3 mmHg.   IAS/Shunts: No atrial level shunt detected by color flow Doppler.    Cardiac event monitor 08/09/2024    Predominant rhythm was atrial fibrillation with 100% A-fib burden.  Average heart rate  68 bpm and ranged from 41 to 117 bpm   Isolated PVCs, ventricular couplets with PVC load less than 1%     Patch Wear Time:  4 days and 11 hours (2025-08-31T08:20:16-0400 to 2025-09-04T19:53:53-0400)   Atrial Fibrillation occurred continuously (100% burden), ranging from 41-117 bpm (avg of 68 bpm). Isolated VEs were rare (<1.0%), VE Couplets were rare (<1.0%), and no VE Triplets were present.    Assessment & Plan   1.  Essential hypertension-BP today 110/48.  Notes that she has been having lower blood pressures postradiation.  She is symptomatic with this. Heart healthy diet Continue irbesartan , continue atenolol  Decrease hydralazine  to 10 mg 3 times daily Decrease HCTZ to 12.5 mg daily  Persistent atrial fibrillation-heart rate today 66.  Reports compliance with apixaban .  Denies bleeding issues. Avoid triggers caffeine, chocolate, EtOH, dehydration excetra. Continue apixaban , atenolol   Follows with Dr. Cindie  Coronary artery disease-no chest pain today.  Denies exertional chest discomfort.  Increased fatigue with radiation treatment. Heart healthy diet Continue rosuvastatin , atenolol   Dilated cardiomyopathy-Notes increased fatigue and decreased activity tolerance.  Echocardiogram 9/23 showed an LVEF of 55 to 60% and G2 DD.  No significant changes were found when compared to previous study. 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  She is receiving radiation. Follows with oncology  Disposition: Follow-up with Dr. Shlomo in 2-3 months.   Tabitha Murphy. Tabitha Rinck NP-C     10/21/2024, 9:02 AM [facility] [address] [place], [place] [address] Office [phone]    Notice: This dictation was prepared with Dragon dictation along with smaller phrase  technology. Any transcriptional errors that result from this process are unintentional and may not be corrected upon review.   I spent 14 minutes examining this patient, reviewing medications, and using patient centered shared decision making involving their cardiac care.   I spent  20 minutes reviewing past medical history,  medications, and prior cardiac tests.

## 2024-10-21 NOTE — Patient Instructions (Addendum)
 Medication Instructions:  CHANGE: HYDRALAZINE  TO 10mg  (3) THREE TIMES A DAY TAKE 1 TABLET AT 7AM, 12PM AND 5PM DECREASE Hydrochlorothiazide  to 12.5mg  Take 1 tablet daily in the mornings *If you need a refill on your cardiac medications before your next appointment, please call your pharmacy*  Lab Work: TODAY-BMET & CBC If you have labs (blood work) drawn today and your tests are completely normal, you will receive your results only by: MyChart Message (if you have MyChart) OR A paper copy in the mail If you have any lab test that is abnormal or we need to change your treatment, we will call you to review the results.  Testing/Procedures: Your physician has requested that you have an echocardiogram. Echocardiography is a painless test that uses sound waves to create images of your heart. It provides your doctor with information about the size and shape of your heart and how well your heart's chambers and valves are working. This procedure takes approximately one hour. There are no restrictions for this procedure. Please do NOT wear cologne, perfume, aftershave, or lotions (deodorant is allowed). Please arrive 15 minutes prior to your appointment time.  Please note: We ask at that you not bring children with you during ultrasound (echo/ vascular) testing. Due to room size and safety concerns, children are not allowed in the ultrasound rooms during exams. Our front office staff cannot provide observation of children in our lobby area while testing is being conducted. An adult accompanying a patient to their appointment will only be allowed in the ultrasound room at the discretion of the ultrasound technician under special circumstances. We apologize for any inconvenience.  Follow-Up: At [facility], you and your health needs are our priority.  As part of our continuing mission to provide you with exceptional heart care, our providers are all part of one team.  This team includes your  primary Cardiologist (physician) and Advanced Practice Providers or APPs (Physician Assistants and Nurse Practitioners) who all work together to provide you with the care you need, when you need it.  Your next appointment:   2-3 month(s)  Provider:   Wilbert Bihari, MD    We recommend signing up for the patient portal called MyChart.  Sign up information is provided on this After Visit Summary.  MyChart is used to connect with patients for Virtual Visits (Telemedicine).  Patients are able to view lab/test results, encounter notes, upcoming appointments, etc.  Non-urgent messages can be sent to your provider as well.   To learn more about what you can do with MyChart, go to [email or web address].   Other Instructions FOLLOW UP WITH YOUR ONCOLOGY DOCTOR TO DISCUSS YOUR SYMPTOMS

## 2024-10-22 LAB — CBC
Hematocrit: 38.9 % (ref 34.0–46.6)
Hemoglobin: 12.7 g/dL (ref 11.1–15.9)
MCH: 30.9 pg (ref 26.6–33.0)
MCHC: 32.6 g/dL (ref 31.5–35.7)
MCV: 95 fL (ref 79–97)
Platelets: 293 x10E3/uL (ref 150–450)
RBC: 4.11 x10E6/uL (ref 3.77–5.28)
RDW: 13.1 % (ref 11.7–15.4)
WBC: 9.7 x10E3/uL (ref 3.4–10.8)

## 2024-10-22 LAB — BASIC METABOLIC PANEL WITH GFR
BUN/Creatinine Ratio: 11 — ABNORMAL LOW (ref 12–28)
BUN: 11 mg/dL (ref 8–27)
CO2: 26 mmol/L (ref 20–29)
Calcium: 9.7 mg/dL (ref 8.7–10.3)
Chloride: 91 mmol/L — ABNORMAL LOW (ref 96–106)
Creatinine, Ser: 1 mg/dL (ref 0.57–1.00)
Glucose: 119 mg/dL — ABNORMAL HIGH (ref 70–99)
Potassium: 4.2 mmol/L (ref 3.5–5.2)
Sodium: 134 mmol/L (ref 134–144)
eGFR: 56 mL/min/1.73 — ABNORMAL LOW (ref >59)

## 2024-10-23 ENCOUNTER — Ambulatory Visit: Payer: Self-pay | Admitting: General Practice

## 2024-10-24 DIAGNOSIS — E039 Hypothyroidism, unspecified: Secondary | ICD-10-CM | POA: Diagnosis not present

## 2024-10-24 DIAGNOSIS — R42 Dizziness and giddiness: Secondary | ICD-10-CM | POA: Diagnosis not present

## 2024-10-24 DIAGNOSIS — I10 Essential (primary) hypertension: Secondary | ICD-10-CM | POA: Diagnosis not present

## 2024-10-24 DIAGNOSIS — I480 Paroxysmal atrial fibrillation: Secondary | ICD-10-CM | POA: Diagnosis not present

## 2024-10-24 DIAGNOSIS — R112 Nausea with vomiting, unspecified: Secondary | ICD-10-CM | POA: Diagnosis not present

## 2024-10-24 DIAGNOSIS — C50911 Malignant neoplasm of unspecified site of right female breast: Secondary | ICD-10-CM | POA: Diagnosis not present

## 2024-10-25 ENCOUNTER — Ambulatory Visit
Admission: RE | Admit: 2024-10-25 | Discharge: 2024-10-25 | Disposition: A | Discharge status: Home / Self Care | Source: Ambulatory Visit | Attending: Radiation Oncology | Admitting: Radiation Oncology

## 2024-10-25 ENCOUNTER — Other Ambulatory Visit: Payer: Self-pay

## 2024-10-25 DIAGNOSIS — Z170 Estrogen receptor positive status [ER+]: Secondary | ICD-10-CM | POA: Diagnosis not present

## 2024-10-25 DIAGNOSIS — Z510 Encounter for antineoplastic radiation therapy: Secondary | ICD-10-CM | POA: Diagnosis not present

## 2024-10-25 DIAGNOSIS — C50211 Malignant neoplasm of upper-inner quadrant of right female breast: Secondary | ICD-10-CM | POA: Diagnosis not present

## 2024-10-25 LAB — RAD ONC ARIA SESSION SUMMARY
Course Elapsed Days: 21
Plan Fractions Treated to Date: 4
Plan Prescribed Dose Per Fraction: 5.7 Gy
Plan Total Fractions Prescribed: 5
Plan Total Prescribed Dose: 28.5 Gy
Reference Point Dosage Given to Date: 22.8 Gy
Reference Point Session Dosage Given: 5.7 Gy
Session Number: 4

## 2024-11-01 ENCOUNTER — Ambulatory Visit
Admission: RE | Admit: 2024-11-01 | Discharge: 2024-11-01 | Disposition: A | Source: Ambulatory Visit | Attending: Radiation Oncology

## 2024-11-01 ENCOUNTER — Other Ambulatory Visit: Payer: Self-pay

## 2024-11-01 DIAGNOSIS — Z510 Encounter for antineoplastic radiation therapy: Secondary | ICD-10-CM | POA: Diagnosis not present

## 2024-11-01 DIAGNOSIS — C50211 Malignant neoplasm of upper-inner quadrant of right female breast: Secondary | ICD-10-CM | POA: Insufficient documentation

## 2024-11-01 DIAGNOSIS — Z170 Estrogen receptor positive status [ER+]: Secondary | ICD-10-CM | POA: Diagnosis not present

## 2024-11-01 LAB — RAD ONC ARIA SESSION SUMMARY
Course Elapsed Days: 28
Plan Fractions Treated to Date: 5
Plan Prescribed Dose Per Fraction: 5.7 Gy
Plan Total Fractions Prescribed: 5
Plan Total Prescribed Dose: 28.5 Gy
Reference Point Dosage Given to Date: 28.5 Gy
Reference Point Session Dosage Given: 5.7 Gy
Session Number: 5

## 2024-11-02 NOTE — Radiation Completion Notes (Signed)
 Patient Name: Tabitha Murphy, Tabitha Murphy MRN: [id number] Date of Birth: [birth year] Referring Physician: MACKEY CHAD, M.D. Date of Service: 2024-11-02 Radiation Oncologist: Lauraine Golden, M.D. [facility]                             RADIATION ONCOLOGY END OF TREATMENT NOTE     Diagnosis: C50.211 Malignant neoplasm of upper-inner quadrant of right female breast Staging on 2024-08-17: Malignant neoplasm of upper-inner quadrant of right breast in female, estrogen receptor positive (HCC) T=cT1b, N=cN0, M=cM0 Intent: Curative     ==========DELIVERED PLANS==========  First Treatment Date: 2024-10-04 Last Treatment Date: 2024-11-01   Plan Name: Breast_R Site: Breast, Right Technique: 3D Mode: Photon Dose Per Fraction: 5.7 Gy Prescribed Dose (Delivered / Prescribed): 28.5 Gy / 28.5 Gy Prescribed Fxs (Delivered / Prescribed): 5 / 5     ==========ON TREATMENT VISIT DATES========== 2024-10-04, 2024-11-01     ==========UPCOMING VISITS========== 12/08/2024 [facility]-RADIATION ONC FOLLOW UP 20 Golden Lauraine, MD  12/02/2024 [facility]-CV IMG [facility] ECHO ECHOCARDIOGRAM [facility]-ECHO 2        ==========APPENDIX - ON TREATMENT VISIT NOTES==========   See weekly On Treatment Notes in Epic for details in the Media tab (listed as Progress notes on the On Treatment Visit Dates listed above).

## 2024-11-14 NOTE — Progress Notes (Unsigned)
°  Electrophysiology Office Follow up Visit Note:    Date:  11/17/2024   ID:  Tabitha Murphy, DOB Feb 05, 1941, MRN [id number]  PCP:  Rolinda Millman, MD  [facility] Cardiologist:  Wilbert Bihari, MD  [facility] Electrophysiologist:  OLE ONEIDA HOLTS, MD    Interval History:     Tabitha Murphy is a 83 y.o. female who presents for a follow up visit.   I last saw the patient July 14, 2024.  She has a history of persistent atrial fibrillation and hypertension.  She was awaiting hip surgery at the time of our last appointment.  She is doing great today.  She is back in normal rhythm.  Her recordings at home have also demonstrated normal sinus rhythm.  She is recovering well after her surgery and radiation.      Past medical, surgical, social and family history were reviewed.  ROS:   Please see the history of present illness.    All other systems reviewed and are negative.  EKGs/Labs/Other Studies Reviewed:    The following studies were reviewed today:  August 10, 2024 ZIO monitor personally reviewed Continuous atrial fibrillation, average heart rate 68 bpm        Physical Exam:    VS:  BP 136/80 (BP Location: Left Arm, Patient Position: Sitting, Cuff Size: Normal)   Pulse 64   Ht 5' 1.5 (1.562 m)   Wt 157 lb (71.2 kg)   SpO2 97%   BMI 29.18 kg/m     Wt Readings from Last 3 Encounters:  11/17/24 157 lb (71.2 kg)  10/21/24 157 lb (71.2 kg)  09/20/24 155 lb 12.8 oz (70.7 kg)     GEN: no distress CARD: RRR, No MRG RESP: No IWOB. CTAB.      ASSESSMENT:    1. Persistent atrial fibrillation (HCC)    PLAN:    In order of problems listed above:  #Persistent atrial fibrillation On Eliquis  for stroke prophylaxis, continue this. Back in sinus rhythm today.  I discussed my upcoming departure from Tabitha Murphy during today's clinic appointment.  The patient will continue to follow-up with one of my EP partners moving forward.  Follow-up 6 months with EP  APP  Signed, Ole Holts, MD, [facility], [facility] 11/17/2024 11:49 AM    Electrophysiology [facility]

## 2024-11-17 ENCOUNTER — Encounter: Payer: Self-pay | Admitting: Cardiology

## 2024-11-17 ENCOUNTER — Ambulatory Visit: Admitting: Cardiology

## 2024-11-17 VITALS — BP 136/80 | HR 64 | Ht 61.5 in | Wt 157.0 lb

## 2024-11-17 DIAGNOSIS — I4819 Other persistent atrial fibrillation: Secondary | ICD-10-CM

## 2024-11-17 NOTE — Patient Instructions (Signed)

## 2024-11-28 NOTE — Progress Notes (Signed)
 Tabitha Murphy is here today for follow up post radiation to the breast.   Breast Side: right   They completed their radiation on: 11/01/24   Does the patient complain of any of the following: Post radiation skin issues: No Breast Tenderness: No Breast Swelling: No Lymphadema: No Range of Motion limitations: No Fatigue post radiation: a little Appetite good/fair/poor: good  Additional comments if applicable: Patient states she is doing very well post radiation.

## 2024-12-02 ENCOUNTER — Ambulatory Visit (HOSPITAL_COMMUNITY)
Admission: RE | Admit: 2024-12-02 | Discharge: 2024-12-02 | Disposition: A | Discharge status: Home / Self Care | Source: Ambulatory Visit | Attending: General Practice | Admitting: General Practice

## 2024-12-02 DIAGNOSIS — I10 Essential (primary) hypertension: Secondary | ICD-10-CM | POA: Diagnosis not present

## 2024-12-02 DIAGNOSIS — I420 Dilated cardiomyopathy: Secondary | ICD-10-CM | POA: Diagnosis not present

## 2024-12-02 DIAGNOSIS — I4819 Other persistent atrial fibrillation: Secondary | ICD-10-CM | POA: Diagnosis present

## 2024-12-02 DIAGNOSIS — I25118 Atherosclerotic heart disease of native coronary artery with other forms of angina pectoris: Secondary | ICD-10-CM | POA: Diagnosis present

## 2024-12-02 LAB — ECHOCARDIOGRAM COMPLETE
Area-P 1/2: 3.2 cm2
S' Lateral: 3.4 cm

## 2024-12-08 ENCOUNTER — Ambulatory Visit
Admission: RE | Admit: 2024-12-08 | Discharge: 2024-12-08 | Disposition: A | Discharge status: Home / Self Care | Source: Ambulatory Visit | Attending: Radiation Oncology | Admitting: Radiation Oncology

## 2024-12-08 DIAGNOSIS — C50211 Malignant neoplasm of upper-inner quadrant of right female breast: Secondary | ICD-10-CM

## 2024-12-15 ENCOUNTER — Telehealth: Payer: Self-pay | Admitting: Radiation Oncology

## 2024-12-15 NOTE — Telephone Encounter (Signed)
 1/15 Preoperative Clearance Form received from Dr. Aluisio and given to nursing for completion.

## 2024-12-16 ENCOUNTER — Telehealth (HOSPITAL_BASED_OUTPATIENT_CLINIC_OR_DEPARTMENT_OTHER): Payer: Self-pay

## 2024-12-16 NOTE — Telephone Encounter (Signed)
"  ° °  Pre-operative Risk Assessment    Patient Name: Tabitha Murphy  DOB: 04/09/1941 MRN: [id number]   Date of last office visit: 11/17/24 with Cindie Date of next office visit: 02/17/25 with Turner  Request for Surgical Clearance    Procedure:  Right hip arthroplasty   Date of Surgery:  Clearance 01/30/25                                  Surgeon:  Dr. Melodi Surgeon's Group or Practice Name:  [facility] Phone number:  [phone] Fax number:  [phone]   Type of Clearance Requested:   - Medical  - Pharmacy:  Hold Apixaban  (Eliquis ) not indicated   Type of Anesthesia:  choice   Additional requests/questions:    Tabitha Murphy   12/16/2024, 9:11 AM   "

## 2024-12-16 NOTE — Telephone Encounter (Signed)
 Pharmacy, can you please provide recommendations for holding Eliquis  for upcoming procedure?  Thank you!

## 2024-12-26 NOTE — Telephone Encounter (Signed)
 Patient with diagnosis of A Fib on Eliquis  for anticoagulation.    Procedure: Right hip arthroplasty  Date of procedure: 01/30/25   CHA2DS2-VASc Score = 5  This indicates a 7.2% annual risk of stroke. The patient's score is based upon: CHF History: 0 HTN History: 1 Diabetes History: 0 Stroke History: 0 Vascular Disease History: 1 Age Score: 2 Gender Score: 1     CrCl 48 ml/min Platelet count 293k  Patient has not had an Afib/aflutter ablation in the last 3 months, DCCV within the last 4 weeks or a watchman implanted in the last 45 days   Per office protocol, patient can hold Eliquis  for 3 days prior to procedure.    **This guidance is not considered finalized until pre-operative APP has relayed final recommendations.**

## 2024-12-27 NOTE — Telephone Encounter (Signed)
" ° °  Name: Tabitha Murphy  DOB: 1941-09-26  MRN: [id number]  Primary Cardiologist: Wilbert Bihari, MD   Preoperative team, please contact this patient and set up a phone call appointment for further preoperative risk assessment. Please obtain consent and complete medication review. Thank you for your help.  I confirm that guidance regarding antiplatelet and oral anticoagulation therapy has been completed and, if necessary, noted below. - Per office protocol, patient can hold Eliquis  for 3 days prior to procedure.    I also confirmed the patient resides in the state of [place] . As per [facility] Medical Board telemedicine laws, the patient must reside in the state in which the provider is licensed.   Domenic Schoenberger E Luis Nickles, PA-C 12/27/2024, 8:45 PM [facility]    "

## 2024-12-28 ENCOUNTER — Telehealth (HOSPITAL_BASED_OUTPATIENT_CLINIC_OR_DEPARTMENT_OTHER): Payer: Self-pay | Admitting: *Deleted

## 2024-12-28 NOTE — Telephone Encounter (Signed)
 Pt has been scheduled tele preop appt 01/16/25. Med rec and consent are done.

## 2024-12-28 NOTE — Telephone Encounter (Signed)
 Pt has been scheduled tele preop appt 01/16/25. Med rec and consent are done.     Patient Consent for Virtual Visit        Tabitha Murphy has provided verbal consent on 12/28/2024 for a virtual visit (video or telephone).   CONSENT FOR VIRTUAL VISIT FOR:  Tabitha Murphy [place]  By participating in this virtual visit I agree to the following:  I hereby voluntarily request, consent and authorize [facility] and its employed or contracted physicians, physician assistants, nurse practitioners or other licensed health care professionals (the Practitioner), to provide me with telemedicine health care services (the Services) as deemed necessary by the treating Practitioner. I acknowledge and consent to receive the Services by the Practitioner via telemedicine. I understand that the telemedicine visit will involve communicating with the Practitioner through live audiovisual communication technology and the disclosure of certain medical information by electronic transmission. I acknowledge that I have been given the opportunity to request an in-person assessment or other available alternative prior to the telemedicine visit and am voluntarily participating in the telemedicine visit.  I understand that I have the right to withhold or withdraw my consent to the use of telemedicine in the course of my care at any time, without affecting my right to future care or treatment, and that the Practitioner or I may terminate the telemedicine visit at any time. I understand that I have the right to inspect all information obtained and/or recorded in the course of the telemedicine visit and may receive copies of available information for a reasonable fee.  I understand that some of the potential risks of receiving the Services via telemedicine include:  Delay or interruption in medical evaluation due to technological equipment failure or disruption; Information transmitted may not be sufficient (e.g. poor resolution  of images) to allow for appropriate medical decision making by the Practitioner; and/or  In rare instances, security protocols could fail, causing a breach of personal health information.  Furthermore, I acknowledge that it is my responsibility to provide information about my medical history, conditions and care that is complete and accurate to the best of my ability. I acknowledge that Practitioner's advice, recommendations, and/or decision may be based on factors not within their control, such as incomplete or inaccurate data provided by me or distortions of diagnostic images or specimens that may result from electronic transmissions. I understand that the practice of medicine is not an exact science and that Practitioner makes no warranties or guarantees regarding treatment outcomes. I acknowledge that a copy of this consent can be made available to me via my patient portal [facility] MyChart), or I can request a printed copy by calling the office of  [facility].    I understand that my insurance will be billed for this visit.   I have read or had this consent read to me. I understand the contents of this consent, which adequately explains the benefits and risks of the Services being provided via telemedicine.  I have been provided ample opportunity to ask questions regarding this consent and the Services and have had my questions answered to my satisfaction. I give my informed consent for the services to be provided through the use of telemedicine in my medical care

## 2025-01-16 ENCOUNTER — Ambulatory Visit

## 2025-01-18 ENCOUNTER — Encounter (HOSPITAL_COMMUNITY)

## 2025-01-30 ENCOUNTER — Ambulatory Visit (HOSPITAL_COMMUNITY): Admit: 2025-01-30 | Admitting: Orthopedic Surgery

## 2025-01-30 SURGERY — ARTHROPLASTY, HIP, TOTAL, ANTERIOR APPROACH
Anesthesia: Choice | Site: Hip | Laterality: Right

## 2025-02-14 ENCOUNTER — Inpatient Hospital Stay: Admitting: Adult Health

## 2025-02-14 ENCOUNTER — Inpatient Hospital Stay

## 2025-02-17 ENCOUNTER — Ambulatory Visit: Admitting: Cardiology
